# Patient Record
Sex: Male | Born: 1950 | Race: White | Hispanic: No | Marital: Married | Smoking: Former smoker
Health system: Southern US, Community
[De-identification: ages and names within clinical notes are randomized; demographics above are authoritative.]

## PROBLEM LIST (undated history)

## (undated) DIAGNOSIS — I209 Angina pectoris, unspecified: Secondary | ICD-10-CM

## (undated) DIAGNOSIS — D649 Anemia, unspecified: Secondary | ICD-10-CM

## (undated) DIAGNOSIS — K603 Anal fistula, unspecified: Secondary | ICD-10-CM

## (undated) DIAGNOSIS — E785 Hyperlipidemia, unspecified: Secondary | ICD-10-CM

## (undated) DIAGNOSIS — I4510 Unspecified right bundle-branch block: Secondary | ICD-10-CM

## (undated) DIAGNOSIS — E119 Type 2 diabetes mellitus without complications: Secondary | ICD-10-CM

## (undated) DIAGNOSIS — K317 Polyp of stomach and duodenum: Secondary | ICD-10-CM

## (undated) DIAGNOSIS — N3281 Overactive bladder: Secondary | ICD-10-CM

## (undated) DIAGNOSIS — M1990 Unspecified osteoarthritis, unspecified site: Secondary | ICD-10-CM

## (undated) DIAGNOSIS — K611 Rectal abscess: Secondary | ICD-10-CM

## (undated) DIAGNOSIS — M5030 Other cervical disc degeneration, unspecified cervical region: Secondary | ICD-10-CM

## (undated) DIAGNOSIS — K31819 Angiodysplasia of stomach and duodenum without bleeding: Secondary | ICD-10-CM

## (undated) DIAGNOSIS — N529 Male erectile dysfunction, unspecified: Secondary | ICD-10-CM

## (undated) DIAGNOSIS — K219 Gastro-esophageal reflux disease without esophagitis: Secondary | ICD-10-CM

## (undated) DIAGNOSIS — M65311 Trigger thumb, right thumb: Secondary | ICD-10-CM

## (undated) DIAGNOSIS — M109 Gout, unspecified: Secondary | ICD-10-CM

## (undated) DIAGNOSIS — N281 Cyst of kidney, acquired: Secondary | ICD-10-CM

## (undated) DIAGNOSIS — K2980 Duodenitis without bleeding: Secondary | ICD-10-CM

## (undated) DIAGNOSIS — M65321 Trigger finger, right index finger: Secondary | ICD-10-CM

## (undated) DIAGNOSIS — I219 Acute myocardial infarction, unspecified: Secondary | ICD-10-CM

## (undated) DIAGNOSIS — K209 Esophagitis, unspecified without bleeding: Secondary | ICD-10-CM

## (undated) DIAGNOSIS — I498 Other specified cardiac arrhythmias: Secondary | ICD-10-CM

## (undated) DIAGNOSIS — I2510 Atherosclerotic heart disease of native coronary artery without angina pectoris: Secondary | ICD-10-CM

## (undated) DIAGNOSIS — M5135 Other intervertebral disc degeneration, thoracolumbar region: Secondary | ICD-10-CM

## (undated) DIAGNOSIS — I10 Essential (primary) hypertension: Secondary | ICD-10-CM

## (undated) DIAGNOSIS — M65341 Trigger finger, right ring finger: Secondary | ICD-10-CM

## (undated) DIAGNOSIS — I700 Atherosclerosis of aorta: Secondary | ICD-10-CM

## (undated) HISTORY — DX: Hyperlipidemia, unspecified: E78.5

## (undated) HISTORY — PX: CARDIAC CATHETERIZATION: SHX172

## (undated) HISTORY — PX: TONSILLECTOMY: SUR1361

## (undated) HISTORY — PX: ANAL FISTULOTOMY: SHX6423

## (undated) HISTORY — DX: Overactive bladder: N32.81

## (undated) HISTORY — DX: Gout, unspecified: M10.9

## (undated) HISTORY — PX: APPENDECTOMY: SHX54

---

## 2012-02-05 DIAGNOSIS — K611 Rectal abscess: Secondary | ICD-10-CM | POA: Insufficient documentation

## 2012-09-08 DIAGNOSIS — K603 Anal fistula: Secondary | ICD-10-CM | POA: Insufficient documentation

## 2017-02-21 ENCOUNTER — Inpatient Hospital Stay
Admission: EM | Admit: 2017-02-21 | Discharge: 2017-02-25 | DRG: 247 | Disposition: A | Discharge status: Home / Self Care | Payer: Medicare Other | Attending: Internal Medicine | Admitting: Internal Medicine

## 2017-02-21 ENCOUNTER — Encounter: Payer: Self-pay | Admitting: Emergency Medicine

## 2017-02-21 ENCOUNTER — Emergency Department: Payer: Medicare Other

## 2017-02-21 DIAGNOSIS — R739 Hyperglycemia, unspecified: Secondary | ICD-10-CM | POA: Diagnosis present

## 2017-02-21 DIAGNOSIS — Z880 Allergy status to penicillin: Secondary | ICD-10-CM | POA: Diagnosis not present

## 2017-02-21 DIAGNOSIS — Z87891 Personal history of nicotine dependence: Secondary | ICD-10-CM | POA: Diagnosis not present

## 2017-02-21 DIAGNOSIS — I10 Essential (primary) hypertension: Secondary | ICD-10-CM | POA: Diagnosis present

## 2017-02-21 DIAGNOSIS — I161 Hypertensive emergency: Secondary | ICD-10-CM | POA: Diagnosis present

## 2017-02-21 DIAGNOSIS — I2510 Atherosclerotic heart disease of native coronary artery without angina pectoris: Secondary | ICD-10-CM | POA: Diagnosis present

## 2017-02-21 DIAGNOSIS — E785 Hyperlipidemia, unspecified: Secondary | ICD-10-CM | POA: Diagnosis present

## 2017-02-21 DIAGNOSIS — R0789 Other chest pain: Secondary | ICD-10-CM | POA: Diagnosis present

## 2017-02-21 DIAGNOSIS — Z886 Allergy status to analgesic agent status: Secondary | ICD-10-CM | POA: Diagnosis not present

## 2017-02-21 DIAGNOSIS — I2584 Coronary atherosclerosis due to calcified coronary lesion: Secondary | ICD-10-CM

## 2017-02-21 DIAGNOSIS — E876 Hypokalemia: Secondary | ICD-10-CM | POA: Diagnosis present

## 2017-02-21 DIAGNOSIS — I25119 Atherosclerotic heart disease of native coronary artery with unspecified angina pectoris: Secondary | ICD-10-CM | POA: Diagnosis present

## 2017-02-21 DIAGNOSIS — I214 Non-ST elevation (NSTEMI) myocardial infarction: Secondary | ICD-10-CM | POA: Diagnosis present

## 2017-02-21 DIAGNOSIS — R079 Chest pain, unspecified: Secondary | ICD-10-CM

## 2017-02-21 DIAGNOSIS — Z136 Encounter for screening for cardiovascular disorders: Secondary | ICD-10-CM | POA: Diagnosis not present

## 2017-02-21 HISTORY — DX: Non-ST elevation (NSTEMI) myocardial infarction: I21.4

## 2017-02-21 LAB — CBC
HCT: 47 % (ref 40.0–52.0)
Hemoglobin: 16 g/dL (ref 13.0–18.0)
MCH: 28.9 pg (ref 26.0–34.0)
MCHC: 33.9 g/dL (ref 32.0–36.0)
MCV: 85.3 fL (ref 80.0–100.0)
PLATELETS: 226 10*3/uL (ref 150–440)
RBC: 5.51 MIL/uL (ref 4.40–5.90)
RDW: 13.7 % (ref 11.5–14.5)
WBC: 6 10*3/uL (ref 3.8–10.6)

## 2017-02-21 LAB — TROPONIN I
Troponin I: <0.03 ng/mL (ref <0.03)
Troponin I: 2.98 ng/mL (ref <0.03)

## 2017-02-21 LAB — BASIC METABOLIC PANEL
ANION GAP: 9 (ref 5–15)
BUN: 18 mg/dL (ref 6–20)
CALCIUM: 9.1 mg/dL (ref 8.9–10.3)
CO2: 24 mmol/L (ref 22–32)
CREATININE: 0.88 mg/dL (ref 0.61–1.24)
Chloride: 104 mmol/L (ref 101–111)
GFR calc Af Amer: >60 mL/min (ref >60)
GLUCOSE: 111 mg/dL — AB (ref 65–99)
Potassium: 3.4 mmol/L — ABNORMAL LOW (ref 3.5–5.1)
Sodium: 137 mmol/L (ref 135–145)

## 2017-02-21 LAB — PROTIME-INR
INR: 1.03
Prothrombin Time: 13.5 seconds (ref 11.4–15.2)

## 2017-02-21 LAB — APTT: aPTT: 29 seconds (ref 24–36)

## 2017-02-21 LAB — MAGNESIUM: MAGNESIUM: 2 mg/dL (ref 1.7–2.4)

## 2017-02-21 MED ORDER — HEPARIN BOLUS VIA INFUSION
4000.0000 [IU] | Freq: Once | INTRAVENOUS | Status: AC
  Administered 2017-02-21: 4000 [IU] via INTRAVENOUS
  Filled 2017-02-21: qty 4000

## 2017-02-21 MED ORDER — ONDANSETRON HCL 4 MG/2ML IJ SOLN: 4.0000 mg | Freq: Four times a day (QID) | INTRAMUSCULAR | Status: DC | PRN

## 2017-02-21 MED ORDER — IOPAMIDOL (ISOVUE-370) INJECTION 76%
125.0000 mL | Freq: Once | INTRAVENOUS | Status: AC | PRN
  Administered 2017-02-21: 125 mL via INTRAVENOUS

## 2017-02-21 MED ORDER — POTASSIUM CHLORIDE CRYS ER 20 MEQ PO TBCR
40.0000 meq | EXTENDED_RELEASE_TABLET | Freq: Once | ORAL | Status: AC
  Administered 2017-02-21: 40 meq via ORAL
  Filled 2017-02-21: qty 2

## 2017-02-21 MED ORDER — HYDRALAZINE HCL 20 MG/ML IJ SOLN
10.0000 mg | INTRAMUSCULAR | Status: DC | PRN
  Administered 2017-02-21: 10 mg via INTRAVENOUS
  Filled 2017-02-21: qty 1

## 2017-02-21 MED ORDER — ASPIRIN EC 81 MG PO TBEC
81.0000 mg | DELAYED_RELEASE_TABLET | Freq: Every day | ORAL | Status: DC
  Administered 2017-02-22 – 2017-02-23 (×2): 81 mg via ORAL
  Filled 2017-02-21 (×2): qty 1

## 2017-02-21 MED ORDER — METOPROLOL TARTRATE 25 MG PO TABS
25.0000 mg | ORAL_TABLET | Freq: Two times a day (BID) | ORAL | Status: DC
  Administered 2017-02-22 – 2017-02-25 (×5): 25 mg via ORAL
  Filled 2017-02-21 (×7): qty 1

## 2017-02-21 MED ORDER — HEPARIN (PORCINE) IN NACL 100-0.45 UNIT/ML-% IJ SOLN
1400.0000 [IU]/h | INTRAMUSCULAR | Status: DC
  Administered 2017-02-21: 1200 [IU]/h via INTRAVENOUS
  Administered 2017-02-23 – 2017-02-24 (×2): 1400 [IU]/h via INTRAVENOUS
  Filled 2017-02-21 (×4): qty 250

## 2017-02-21 MED ORDER — ACETAMINOPHEN 325 MG PO TABS
650.0000 mg | ORAL_TABLET | ORAL | Status: DC | PRN
Start: 2017-02-21 — End: 2017-02-25

## 2017-02-21 MED ORDER — ACETAMINOPHEN 325 MG PO TABS
650.0000 mg | ORAL_TABLET | Freq: Once | ORAL | Status: AC
  Administered 2017-02-21: 650 mg via ORAL
  Filled 2017-02-21: qty 2

## 2017-02-21 MED ORDER — NITROGLYCERIN 2 % TD OINT
1.0000 [in_us] | TOPICAL_OINTMENT | Freq: Once | TRANSDERMAL | Status: AC
  Administered 2017-02-21: 1 [in_us] via TOPICAL
  Filled 2017-02-21: qty 1

## 2017-02-21 MED ORDER — ASPIRIN 81 MG PO CHEW
324.0000 mg | CHEWABLE_TABLET | Freq: Once | ORAL | Status: AC
  Administered 2017-02-21: 324 mg via ORAL
  Filled 2017-02-21: qty 4

## 2017-02-21 MED ORDER — ATORVASTATIN CALCIUM 20 MG PO TABS: 40.0000 mg | ORAL_TABLET | Freq: Every day | ORAL | Status: DC

## 2017-02-21 MED ORDER — NITROGLYCERIN 0.4 MG SL SUBL: 0.4000 mg | SUBLINGUAL_TABLET | SUBLINGUAL | Status: DC | PRN

## 2017-02-21 NOTE — Progress Notes (Signed)
Patient admitted to room 238 with the diagnosis of chest pain due to CAD. Alert and oriented x 4. Denied any acute chest pain. Tele box reconciled to CCMD with Janett Billow NT as a second verifier. Skin assessment done with [place], noted abrasion on left lower extremities. Password was set. Patient has not voiced any complaint about his care. Will continue to monitor.

## 2017-02-21 NOTE — Progress Notes (Signed)
Patient's HR fluctuating between 56-63 and he has Metoprolol 25 mg to be administered at this time. Dr. Jannifer Franklin notified and received new order to hold the metoprolol and new order for PRN Hydralazine . Order will be implemented as received and continue to monitor.

## 2017-02-21 NOTE — ED Triage Notes (Signed)
Pt to ed with acute onset of chest pain that started 30 min pta.  Pt states left arm and left shoulder pain and pressure and pain in mid thoracic area in his back .

## 2017-02-21 NOTE — ED Notes (Signed)
Patient ambulatory to restroom without assistance. 

## 2017-02-21 NOTE — ED Notes (Signed)
Pt used restroom prior to transport to 2A. Floor was called prior to arrival

## 2017-02-21 NOTE — H&P (Signed)
[name] [facility] [place] NAME: Mark Wyatt    MR#:  [id number]  DATE OF BIRTH:  1951/07/14  DATE OF ADMISSION:  02/21/2017  PRIMARY CARE PHYSICIAN: Patient, No Pcp Per   REQUESTING/REFERRING PHYSICIAN: Schuyler Amor, MD  CHIEF COMPLAINT:   Chief Complaint  Patient presents with  . Chest Pain   Chest pain yesterday and today. HISTORY OF PRESENT ILLNESS:  Mark Wyatt  is a 66 y.o. male with a known history of Hypertension. The patient to present to the ED with chest pain today in substernal area, sharp, 10 out of 10, radiation to her left shoulder, arm and back, lasted about 45 minutes. He had a similar chest pain yesterday, which lasted about 15 minutes. He denies any palpitation, nausea or diaphoresis. No headache or dizziness. He has a history of hypertension but not on any hypertension medication. His blood pressure is elevated at 205/95. He denies any anxiety or stress. He's not on any medication. The first troponin level is normal. CT chest with contrast didn't show any aortic dissection but showed aortic atherosclerosis, in addition to left main and 3 vessel coronary artery disease. He was treated with aspirin in the ED.  PAST MEDICAL HISTORY:  History reviewed. No pertinent past medical history.  PAST SURGICAL HISTORY:   Past Surgical History:  Procedure Laterality Date  . ANAL FISTULOTOMY      SOCIAL HISTORY:   Social History  Substance Use Topics  . Smoking status: Never Smoker  . Smokeless tobacco: Never Used  . Alcohol use Yes    FAMILY HISTORY:   Family History  Problem Relation Age of Onset  . Diabetes Mother     DRUG ALLERGIES:   Allergies  Allergen Reactions  . Asa [Aspirin]   . Penicillins Other (See Comments)    Headache     REVIEW OF SYSTEMS:   Review of Systems  Constitutional: Negative for chills, diaphoresis, fever and malaise/fatigue.  HENT: Negative for congestion and sore throat.   Eyes:  Negative for blurred vision and double vision.  Respiratory: Negative for cough, shortness of breath and stridor.   Cardiovascular: Positive for chest pain. Negative for palpitations and leg swelling.  Gastrointestinal: Negative for abdominal pain, blood in stool, constipation, diarrhea, melena, nausea and vomiting.  Genitourinary: Negative for dysuria and hematuria.  Musculoskeletal: Positive for back pain.  Neurological: Negative for dizziness, focal weakness, loss of consciousness, weakness and headaches.  Psychiatric/Behavioral: Negative for depression. The patient is not nervous/anxious.     MEDICATIONS AT HOME:   Prior to Admission medications   Not on File      VITAL SIGNS:  Blood pressure (!) 169/95, pulse 61, temperature 97 F (36.1 C), temperature source Oral, resp. rate 16, height 5\' 11"  (1.803 m), weight 205 lb (93 kg), SpO2 99 %.  PHYSICAL EXAMINATION:  Physical Exam  GENERAL:  66 y.o.-year-old patient lying in the bed with no acute distress.  EYES: Pupils equal, round, reactive to light and accommodation. No scleral icterus. Extraocular muscles intact.  HEENT: Head atraumatic, normocephalic. Oropharynx and nasopharynx clear.  NECK:  Supple, no jugular venous distention. No thyroid enlargement, no tenderness.  LUNGS: Normal breath sounds bilaterally, no wheezing, rales,rhonchi or crepitation. No use of accessory muscles of respiration.  CARDIOVASCULAR: S1, S2 normal. No murmurs, rubs, or gallops.  ABDOMEN: Soft, nontender, nondistended. Bowel sounds present. No organomegaly or mass.  EXTREMITIES: No pedal edema, cyanosis, or clubbing.  NEUROLOGIC: Cranial nerves II  through XII are intact. Muscle strength 5/5 in all extremities. Sensation intact. Gait not checked.  PSYCHIATRIC: The patient is alert and oriented x 3.  SKIN: No obvious rash, lesion, or ulcer.   LABORATORY PANEL:   CBC  Recent Labs Lab 02/21/17 1426  WBC 6.0  HGB 16.0  HCT 47.0  PLT 226    ------------------------------------------------------------------------------------------------------------------  Chemistries   Recent Labs Lab 02/21/17 1426  NA 137  K 3.4*  CL 104  CO2 24  GLUCOSE 111*  BUN 18  CREATININE 0.88  CALCIUM 9.1   ------------------------------------------------------------------------------------------------------------------  Cardiac Enzymes  Recent Labs Lab 02/21/17 1426  TROPONINI <0.03   ------------------------------------------------------------------------------------------------------------------  RADIOLOGY:  Dg Chest 2 View  Result Date: 02/21/2017 CLINICAL DATA:  Chest pain EXAM: CHEST  2 VIEW COMPARISON:  None. FINDINGS: Normal heart size and mediastinal contours. No acute infiltrate or edema. No effusion or pneumothorax. No acute osseous findings. IMPRESSION: Negative chest. Electronically Signed   By: Monte Fantasia M.D.   On: 02/21/2017 14:27   Ct Angio Chest/abd/pel For Dissection W And/or Wo Contrast  Result Date: 02/21/2017 CLINICAL DATA:  66 year old male presenting to the emergency department with acute onset of chest pain 30 minutes prior to admission, with some associated left arm and left shoulder pain with pressure sensation in the mid thoracic region. EXAM: CT ANGIOGRAPHY CHEST, ABDOMEN AND PELVIS TECHNIQUE: Multidetector CT imaging through the chest, abdomen and pelvis was performed using the standard protocol during bolus administration of intravenous contrast. Multiplanar reconstructed images and MIPs were obtained and reviewed to evaluate the vascular anatomy. CONTRAST:  125 mL of Isovue 370. COMPARISON:  No priors. FINDINGS: CTA CHEST FINDINGS Cardiovascular: Heart size is normal. There is no significant pericardial fluid, thickening or pericardial calcification. There is aortic atherosclerosis, as well as atherosclerosis of the great vessels of the mediastinum and the coronary arteries, including calcified  atherosclerotic plaque in the left main, left anterior descending, left circumflex and right coronary arteries. Calcifications of the aortic valve. No acute abnormality of the thoracic aorta; specifically, no aneurysm or dissection. Additionally, on the precontrast images there is no crescentic high attenuation associated with the wall of the thoracic aorta to suggest acute intramural hemorrhage. Mediastinum/Nodes: No pathologically enlarged mediastinal or hilar lymph nodes. Esophagus is unremarkable in appearance. No axillary lymphadenopathy. Lungs/Pleura: No acute consolidative airspace disease. No pleural effusions. No suspicious appearing pulmonary nodules or masses. Musculoskeletal: There are no aggressive appearing lytic or blastic lesions noted in the visualized portions of the skeleton. Review of the MIP images confirms the above findings. CTA ABDOMEN AND PELVIS FINDINGS VASCULAR Aorta: Normal caliber aorta without aneurysm, dissection, vasculitis or significant stenosis. Celiac: Patent without evidence of aneurysm, dissection, vasculitis or significant stenosis. SMA: Patent without evidence of aneurysm, dissection, vasculitis or significant stenosis. Renals: All renal arteries (2 right-sided and 3 left-sided) are patent without evidence of aneurysm, dissection, vasculitis, fibromuscular dysplasia or significant stenosis. IMA: Patent without evidence of aneurysm, dissection, vasculitis or significant stenosis. Inflow: Patent without evidence of aneurysm, dissection, vasculitis or significant stenosis. Veins: No obvious venous abnormality within the limitations of this arterial phase study. Review of the MIP images confirms the above findings. NON-VASCULAR Hepatobiliary: Diffuse low attenuation throughout the hepatic parenchyma, compatible with hepatic steatosis. No suspicious cystic or solid hepatic lesions. No intra or extrahepatic biliary ductal dilatation. Gallbladder is normal in appearance. Pancreas: No  pancreatic mass. No pancreatic ductal dilatation. No pancreatic or peripancreatic fluid or inflammatory changes. Spleen: Unremarkable. Adrenals/Urinary Tract: Exophytic 3 cm low-attenuation  nonenhancing lesion in the upper pole of the right kidney, compatible with a simple cyst. Left kidney and bilateral adrenal glands are normal in appearance. No hydroureteronephrosis. Urinary bladder is normal in appearance. Stomach/Bowel: The appearance of the stomach is normal. There is no pathologic dilatation of small bowel or colon. The appendix is not confidently identified and may be surgically absent. Regardless, there are no inflammatory changes noted adjacent to the cecum to suggest the presence of an acute appendicitis at this time. Lymphatic: No lymphadenopathy noted in the abdomen or pelvis. Reproductive: Prostate gland and seminal vesicles are unremarkable in appearance. Other: No significant volume of ascites.  No pneumoperitoneum. Musculoskeletal: There are no aggressive appearing lytic or blastic lesions noted in the visualized portions of the skeleton. Review of the MIP images confirms the above findings. IMPRESSION: 1. No acute abnormality in the chest, abdomen or pelvis to account for the patient's symptoms. Specifically, no evidence of acute aortic syndrome. 2. Aortic atherosclerosis, in addition to left main and 3 vessel coronary artery disease. Please note that although the presence of coronary artery calcium documents the presence of coronary artery disease, the severity of this disease and any potential stenosis cannot be assessed on this non-gated CT examination. Assessment for potential risk factor modification, dietary therapy or pharmacologic therapy may be warranted, if clinically indicated. 3. Hepatic steatosis. 4. Additional incidental findings, as above. Electronically Signed   By: Vinnie Langton M.D.   On: 02/21/2017 16:51      IMPRESSION AND PLAN:   Chest pain with 3 vessel CAD. The  patient will be admitted to medical floor. He was given aspirin in the ED. I will start heparin drip due to concern about 3 vessel disease with typical heart attack symptoms. Follow-up troponin level and lipid panel add Lipitor and Lopressor, follow-up cardiology consult. Nothing by mouth after midnight in case cardiology will do cardiac catheter in a.m.  Hypertension emergency.  The patient's blood pressure was 202/95. I start Lopressor and hydralazine IV when necessary.  Hypokalemia. Give potassium supplement, follow-up potassium level and magnesium level.  All the records are reviewed and case discussed with ED provider. Management plans discussed with the patient, his wife and they are in agreement.  CODE STATUS: Full code  TOTAL TIME TAKING CARE OF THIS PATIENT: 58 minutes.    Demetrios Loll M.D on 02/21/2017 at 6:44 PM  Between 7am to 6pm - Pager - [phone]  After 6pm go to [email or web address] - Proofreader  [facility]  Office  [phone]  CC: Primary care physician; Patient, No Pcp Per   Note: This dictation was prepared with Dragon dictation along with smaller phrase technology. Any transcriptional errors that result from this process are unintentional.

## 2017-02-21 NOTE — ED Notes (Signed)
Patient transported patient to 2A. This RN unavailable d/t EMS arrival.

## 2017-02-21 NOTE — ED Provider Notes (Addendum)
[facility] Emergency Department Provider Note  ____________________________________________   I have reviewed the triage vital signs and the nursing notes.   HISTORY  Chief Complaint Chest Pain    HPI Mark Wyatt is a 66 y.o. male who presents today complaining of chest pain that radiated to his left arm and to his back. Started about 30 minute prior to arrival. Pain was essentially gone upon arrival. He states that he had a little chest pain on exertion yesterday as well. Denies fever or chills. Was short of breath with this. States it was a significant discomfort. Did begin somewhat rapidly. He was working in the yard. He states that he has had no cough or leg swelling. No recent illness. Denies any nausea or vomiting. Pain was a pressure-like and also sharp discomfort he describes as. Wasn't tearing. No alleviating or aggravating factors that he can think of. States that normally his blood pressure is very well controlled but he feels anxious at this time. He does not take any antihypertensive medications. He states his aspirin allergy is "he sometimes feels swimmy headed" he has not actually had any anaphylactic reaction or throat or tongue swelling or hives or anything else with aspirin.     History reviewed. No pertinent past medical history.  There are no active problems to display for this patient.   History reviewed. No pertinent surgical history.  Prior to Admission medications   Not on File    Allergies Asa [aspirin] and Penicillins  No family history on file.  Social History Social History  Substance Use Topics  . Smoking status: Never Smoker  . Smokeless tobacco: Never Used  . Alcohol use Yes    Review of Systems Constitutional: No fever/chills Eyes: No visual changes. ENT: No sore throat. No stiff neck no neck pain Cardiovascular: Positive chest pain. Respiratory: Positive shortness of breath. Gastrointestinal:   no vomiting.   No diarrhea.  No constipation. Genitourinary: Negative for dysuria. Musculoskeletal: Negative lower extremity swelling Skin: Negative for rash. Neurological: Negative for severe headaches, focal weakness or numbness. 10-point ROS otherwise negative.  ____________________________________________   PHYSICAL EXAM:  VITAL SIGNS: ED Triage Vitals  Enc Vitals Group     BP 02/21/17 1411 (!) 202/95     Pulse Rate 02/21/17 1411 61     Resp 02/21/17 1411 20     Temp 02/21/17 1411 97 F (36.1 C)     Temp Source 02/21/17 1411 Oral     SpO2 02/21/17 1411 98 %     Weight 02/21/17 1412 205 lb (93 kg)     Height 02/21/17 1412 5\' 11"  (1.803 m)     Head Circumference --      Peak Flow --      Pain Score 02/21/17 1407 8     Pain Loc --      Pain Edu? --      Excl. in [place]? --     Constitutional: Alert and oriented. Well appearing and in no acute distress. Anxious Eyes: Conjunctivae are normal. PERRL. EOMI. Head: Atraumatic. Nose: No congestion/rhinnorhea. Mouth/Throat: Mucous membranes are moist.  Oropharynx non-erythematous. Neck: No stridor.   Nontender with no meningismus Cardiovascular: Normal rate, regular rhythm. Grossly normal heart sounds.  Good peripheral circulation. Respiratory: Normal respiratory effort.  No retractions. Lungs CTAB. Abdominal: Soft and nontender. No distention. No guarding no rebound Back:  There is no focal tenderness or step off.  there is no midline tenderness there are no lesions noted. there is  no CVA tenderness Musculoskeletal: No lower extremity tenderness, no upper extremity tenderness. No joint effusions, no DVT signs strong distal pulses no edema Neurologic:  Normal speech and language. No gross focal neurologic deficits are appreciated.  Skin:  Skin is warm, dry and intact. No rash noted. Psychiatric: Mood and affect are anxious. Speech and behavior are normal.  ____________________________________________   LABS (all labs ordered are listed, but  only abnormal results are displayed)  Labs Reviewed  BASIC METABOLIC PANEL - Abnormal; Notable for the following:       Result Value   Potassium 3.4 (*)    Glucose, Bld 111 (*)    All other components within normal limits  CBC  TROPONIN I   ____________________________________________  EKG  I personally interpreted any EKGs ordered by me or triage Sinus rhythm at 65 bpm, no acute ST elevation, normal axis, PVC noted. Nonspecific ST changes. ____________________________________________  [id number]  I reviewed any imaging ordered by me or triage that were performed during my shift and, if possible, patient and/or family made aware of any abnormal findings. ____________________________________________   PROCEDURES  Procedure(s) performed: None  Procedures  Critical Care performed: None  ____________________________________________   INITIAL IMPRESSION / ASSESSMENT AND PLAN / ED COURSE  Pertinent labs & imaging results that were available during my care of the patient were reviewed by me and considered in my medical decision making (see chart for details).  Administration with a very concerning story of significant chest pain which radiated to his arm and back. Given his elevated blood pressure I did do a CT dissection which is negative for dissection but does show atherosclerosis of multiple vessels. Patient will require admission. I talked about the risks benefits and alternatives of aspirin given his history of aspirin intolerance and he would prefer to have it I think that is the right choice. He has no history of anaphylaxis with aspirin. Patient is pain-free at this time. His blood pressures coming down as he relaxes. He has gone down 25 points systolic since his arrival here. We'll put nitroglycerin on his chest wall. He does not know that he had a history of hypertension but he may well have had. We will continue to manage him medically here he is chest pain-free at this  time, he will need admission for the hospital.  ----------------------------------------- 5:28 PM on 02/21/2017 -----------------------------------------  Pressure down nearly 40 points from its height, we are still watching it, no chest pain. Admitted to the hospitalist.    ____________________________________________   FINAL CLINICAL IMPRESSION(S) / ED DIAGNOSES  Final diagnoses:  None      This chart was dictated using voice recognition software.  Despite best efforts to proofread,  errors can occur which can change meaning.      Schuyler Amor, MD 02/21/17 [place], MD 02/21/17 [phone]

## 2017-02-21 NOTE — Progress Notes (Signed)
ANTICOAGULATION CONSULT NOTE - Initial Consult  Pharmacy Consult for heparin bolus and drip Indication: chest pain/ACS  Allergies  Allergen Reactions  . Asa [Aspirin]   . Penicillins Other (See Comments)    Headache     Patient Measurements: Height: 5\' 11"  (180.3 cm) Weight: 205 lb (93 kg) IBW/kg (Calculated) : 75.3 Heparin Dosing Weight: 93 kg  Vital Signs: Temp: 97.5 F (36.4 C) (05/04 2007) Temp Source: Oral (05/04 2007) BP: 174/80 (05/04 2007) Pulse Rate: 54 (05/04 2007)  Labs:  Recent Labs  02/21/17 1426  HGB 16.0  HCT 47.0  PLT 226  CREATININE 0.88  TROPONINI <0.03    Estimated Creatinine Clearance: 96.2 mL/min (by C-G formula based on SCr of 0.88 mg/dL).   Medical History: History reviewed. No pertinent past medical history.  Medications:  Scheduled:  . [START ON 02/22/2017] aspirin EC  81 mg Oral Daily  . [START ON 02/22/2017] atorvastatin  40 mg Oral q1800  . heparin  4,000 Units Intravenous Once  . metoprolol tartrate  25 mg Oral BID  . potassium chloride  40 mEq Oral Once   Infusions:  . heparin      Assessment: Pharmacy has been consulted to dose heparin bolus an drip in this 57 yoM with ACS. Per med rec, patient does not take any medication prior to admission. Baseline labs have been ordered. Plan for cardiac cath in am.  Goal of Therapy:  Heparin level 0.3-0.7 units/ml Monitor platelets by anticoagulation protocol: Yes   Plan:  Give 4000 units bolus x 1 Start heparin infusion at 1200 units/hr Check anti-Xa level in 6 hours and daily while on heparin Continue to monitor H&H and platelets  Darrow Bussing, PharmD Pharmacy Resident 02/21/2017 8:11 PM

## 2017-02-22 ENCOUNTER — Inpatient Hospital Stay
Admit: 2017-02-22 | Discharge: 2017-02-22 | Disposition: A | Discharge status: Home / Self Care | Payer: Medicare Other | Attending: Cardiology | Admitting: Cardiology

## 2017-02-22 DIAGNOSIS — I214 Non-ST elevation (NSTEMI) myocardial infarction: Secondary | ICD-10-CM

## 2017-02-22 DIAGNOSIS — I252 Old myocardial infarction: Secondary | ICD-10-CM | POA: Insufficient documentation

## 2017-02-22 DIAGNOSIS — I25119 Atherosclerotic heart disease of native coronary artery with unspecified angina pectoris: Secondary | ICD-10-CM

## 2017-02-22 LAB — ECHOCARDIOGRAM COMPLETE
Height: 71 in
Weight: 3304 [oz_av]

## 2017-02-22 LAB — CBC
HCT: 44.9 % (ref 40.0–52.0)
HEMOGLOBIN: 15 g/dL (ref 13.0–18.0)
MCH: 28.2 pg (ref 26.0–34.0)
MCHC: 33.3 g/dL (ref 32.0–36.0)
MCV: 84.8 fL (ref 80.0–100.0)
PLATELETS: 209 10*3/uL (ref 150–440)
RBC: 5.29 MIL/uL (ref 4.40–5.90)
RDW: 13.6 % (ref 11.5–14.5)
WBC: 6.9 10*3/uL (ref 3.8–10.6)

## 2017-02-22 LAB — HEPARIN LEVEL (UNFRACTIONATED)
HEPARIN UNFRACTIONATED: 0.26 [IU]/mL — AB (ref 0.30–0.70)
Heparin Unfractionated: 0.38 IU/mL (ref 0.30–0.70)
Heparin Unfractionated: 0.5 [IU]/mL (ref 0.30–0.70)

## 2017-02-22 LAB — LIPID PANEL
Cholesterol: 174 mg/dL (ref 0–200)
HDL: 34 mg/dL — ABNORMAL LOW (ref >40)
LDL CALC: 111 mg/dL — AB (ref 0–99)
TRIGLYCERIDES: 143 mg/dL (ref <150)
Total CHOL/HDL Ratio: 5.1 RATIO
VLDL: 29 mg/dL (ref 0–40)

## 2017-02-22 LAB — TSH: TSH: 4.7 u[IU]/mL — AB (ref 0.350–4.500)

## 2017-02-22 LAB — TROPONIN I: Troponin I: 4.93 ng/mL (ref <0.03)

## 2017-02-22 MED ORDER — ATORVASTATIN CALCIUM 20 MG PO TABS
40.0000 mg | ORAL_TABLET | Freq: Every day | ORAL | Status: DC
  Administered 2017-02-22 – 2017-02-24 (×3): 40 mg via ORAL
  Filled 2017-02-22 (×3): qty 2

## 2017-02-22 MED ORDER — HEPARIN BOLUS VIA INFUSION
1400.0000 [IU] | Freq: Once | INTRAVENOUS | Status: AC
  Administered 2017-02-22: 1400 [IU] via INTRAVENOUS
  Filled 2017-02-22: qty 1400

## 2017-02-22 MED ORDER — PERFLUTREN LIPID MICROSPHERE
1.0000 mL | INTRAVENOUS | Status: AC | PRN
  Administered 2017-02-22: 8 mL via INTRAVENOUS
  Filled 2017-02-22: qty 10

## 2017-02-22 MED ORDER — NITROGLYCERIN 2 % TD OINT
1.0000 [in_us] | TOPICAL_OINTMENT | Freq: Four times a day (QID) | TRANSDERMAL | Status: DC
  Administered 2017-02-22 – 2017-02-25 (×11): 1 [in_us] via TOPICAL
  Filled 2017-02-22 (×11): qty 1

## 2017-02-22 NOTE — Progress Notes (Signed)
*  PRELIMINARY RESULTS* Echocardiogram 2D Echocardiogram has been performed. Definity Contrast requested & used on this study.  Mark Wyatt Mark Wyatt 02/22/2017, 12:27 PM

## 2017-02-22 NOTE — Progress Notes (Signed)
2nd Troponin 2.98, Patient denies any chest pain. Dr. Jannifer Franklin notified. No new order received but to follow the trend and monitor.

## 2017-02-22 NOTE — Progress Notes (Signed)
ANTICOAGULATION CONSULT NOTE - FOLLOW UP   Pharmacy Consult for heparin bolus and drip Indication: chest pain/ACS  Allergies  Allergen Reactions  . Asa [Aspirin]   . Penicillins Other (See Comments)    Headache     Patient Measurements: Height: 5\' 11"  (180.3 cm) Weight: 206 lb 8 oz (93.7 kg) IBW/kg (Calculated) : 75.3 Heparin Dosing Weight: 93 kg  Vital Signs: Temp: 97.6 F (36.4 C) (05/05 0724) Temp Source: Oral (05/05 0724) BP: 156/79 (05/05 0724) Pulse Rate: 58 (05/05 0724)  Labs:  Recent Labs  02/21/17 1426 02/21/17 2012 02/22/17 0315  HGB 16.0  --  15.0  HCT 47.0  --  44.9  PLT 226  --  209  APTT  --  29  --   LABPROT  --  13.5  --   INR  --  1.03  --   HEPARINUNFRC  --   --  0.26*  CREATININE 0.88  --   --   TROPONINI <0.03 2.98* 4.93*    Estimated Creatinine Clearance: 96.6 mL/min (by C-G formula based on SCr of 0.88 mg/dL).   Medical History: History reviewed. No pertinent past medical history.  Medications:  Scheduled:  . aspirin EC  81 mg Oral Daily  . atorvastatin  40 mg Oral q1800  . metoprolol tartrate  25 mg Oral BID  . nitroGLYCERIN  1 inch Topical Q6H   Infusions:  . heparin 1,200 Units/hr (02/21/17 2107)    Assessment: Pharmacy has been consulted to dose heparin bolus an drip in this 27 yoM with ACS. Per med rec, patient does not take any medication prior to admission. Baseline labs have been ordered. Plan for cardiac cath in am.  Goal of Therapy:  Heparin level 0.3-0.7 units/ml Monitor platelets by anticoagulation protocol: Yes   Plan:  Will give 1400 units x 1 bolus. Will increase heparin gtt rate to 1400 units/hr. Will recheck heparin level @ 17:00.   Larene Beach, PharmD

## 2017-02-22 NOTE — Progress Notes (Addendum)
ANTICOAGULATION CONSULT NOTE - FOLLOW UP   Pharmacy Consult for heparin bolus and drip Indication: chest pain/ACS  Allergies  Allergen Reactions  . Asa [Aspirin]   . Penicillins Other (See Comments)    Headache     Patient Measurements: Height: 5\' 11"  (180.3 cm) Weight: 206 lb 8 oz (93.7 kg) IBW/kg (Calculated) : 75.3 Heparin Dosing Weight: 93 kg  Vital Signs: Temp: 97.9 F (36.6 C) (05/05 1248) Temp Source: Oral (05/05 0724) BP: 146/77 (05/05 1248) Pulse Rate: 59 (05/05 1248)  Labs:  Recent Labs  02/21/17 1426 02/21/17 2012 02/22/17 0315 02/22/17 1733  HGB 16.0  --  15.0  --   HCT 47.0  --  44.9  --   PLT 226  --  209  --   APTT  --  29  --   --   LABPROT  --  13.5  --   --   INR  --  1.03  --   --   HEPARINUNFRC  --   --  0.26* 0.50  CREATININE 0.88  --   --   --   TROPONINI <0.03 2.98* 4.93*  --     Estimated Creatinine Clearance: 96.6 mL/min (by C-G formula based on SCr of 0.88 mg/dL).   Medical History: History reviewed. No pertinent past medical history.  Medications:  Scheduled:  . aspirin EC  81 mg Oral Daily  . atorvastatin  40 mg Oral q1800  . metoprolol tartrate  25 mg Oral BID  . nitroGLYCERIN  1 inch Topical Q6H   Infusions:  . heparin 1,400 Units/hr (02/22/17 1341)    Assessment: Pharmacy has been consulted to dose heparin bolus an drip in this 66 yoM with ACS. Per med rec, patient does not take any medication prior to admission. Baseline labs have been ordered. Plan for cardiac cath in am.  Goal of Therapy:  Heparin level 0.3-0.7 units/ml Monitor platelets by anticoagulation protocol: Yes   Plan:  Will give 1400 units x 1 bolus. Will increase heparin gtt rate to 1400 units/hr. Will recheck heparin level @ 17:00.   5/5:  HL @ 17:30 = 0.50.  Will continue this pt on current rate and recheck HL in 6 hrs on 5/5 @ 23:30.   5/5 23:30 heparin level 0.38. Continue current regimen and recheck with AM labs.  5/6 AM heparin level 0.37.  Continue current regimen. Recheck CBC and heparin level with tomorrow AM labs.   Sim Boast, PharmD, BCPS  02/23/17 6:25 AM

## 2017-02-22 NOTE — Consult Note (Addendum)
[place]  CARDIOLOGY CONSULT NOTE  Patient ID: Mark Wyatt MRN: [id number] DOB/AGE: 06/20/1951 66 y.o.  Admit date: 02/21/2017 Referring Physician Dr. Benjie Karvonen Primary Physician Dr. Truman Hayward, [facility] Primary Cardiologist   Reason for Consultation nstemi  HPI: Patient is a 66 year old male with no prior cardiac history who was admitted after developing midsternal is back. This began occurring earlier last week. The episodes are fairly short-lived. Occurred predominantly with rest. He did not have associated nausea, vomiting or diaphoresis. He has a history of remote tobacco use. He has normal cholesterol. He is not hypertensive not diabetic and has no significant family history of heart disease. He is on no medications prior to presentation. Patient developed midsternal chest pain. Blood pressure was markedly elevated during the pain. He presented to the emergency room where electrocardiogram showed sinus rhythm with no ischemia. His initial serum troponin was normal at 0.03. Second troponin increased to 2.98 with third troponin at 3:15 this morning was 4.93. He still has mild chest tenderness. He denies shortness of breath. He is hemodynamically stable. His blood pressure is improved with pain relief. He is currently on aspirin, topical nitrates, metoprolol, heparin. He has a mildly elevated TSH. Lipid profile shows an LDL of 111 with an HDL of 34. CBC showed no anemia or elevated white count. Renal function is normal. Patient had a chest CT due to elevated hypertension in the ER. This revealed no dissection but calcium in his coronary arteries. Chest x-ray revealed no acute cardiopulmonary disease.  Review of Systems  Constitutional: Negative.   HENT: Negative.   Eyes: Negative.   Respiratory: Negative.   Cardiovascular: Positive for chest pain.  Gastrointestinal: Negative.   Genitourinary: Negative.   Musculoskeletal: Negative.   Skin:  Negative.   Neurological: Negative.   Endo/Heme/Allergies: Negative.   Psychiatric/Behavioral: Negative.     History reviewed. No pertinent past medical history.  Family History  Problem Relation Age of Onset  . Diabetes Mother     Social History   Social History  . Marital status: Widowed    Spouse name: N/A  . Number of children: N/A  . Years of education: N/A   Occupational History  . Not on file.   Social History Main Topics  . Smoking status: Never Smoker  . Smokeless tobacco: Never Used  . Alcohol use Yes  . Drug use: No  . Sexual activity: Not on file   Other Topics Concern  . Not on file   Social History Narrative  . No narrative on file    Past Surgical History:  Procedure Laterality Date  . ANAL FISTULOTOMY       No prescriptions prior to admission.    Physical Exam: Blood pressure (!) 156/79, pulse (!) 58, temperature 97.6 F (36.4 C), temperature source Oral, resp. rate 12, height 5\' 11"  (1.803 m), weight 93.7 kg (206 lb 8 oz), SpO2 96 %.   Wt Readings from Last 1 Encounters:  02/21/17 93.7 kg (206 lb 8 oz)     General appearance: alert and cooperative Resp: clear to auscultation bilaterally Chest wall: no tenderness Cardio: regular rate and rhythm GI: soft, non-tender; bowel sounds normal; no masses,  no organomegaly Extremities: extremities normal, atraumatic, no cyanosis or edema Pulses: 2+ and symmetric Neurologic: Grossly normal  Labs:   Lab Results  Component Value Date   WBC 6.9 02/22/2017   HGB 15.0 02/22/2017   HCT 44.9 02/22/2017   MCV 84.8 02/22/2017  PLT 209 02/22/2017    Recent Labs Lab 02/21/17 1426  NA 137  K 3.4*  CL 104  CO2 24  BUN 18  CREATININE 0.88  CALCIUM 9.1  GLUCOSE 111*   Lab Results  Component Value Date   TROPONINI 4.93 ([place]) 02/22/2017      Radiology: No acute cardiopulmonary disease on chest x-ray. Chest CT revealed no dissection or other abnormality Patient's symptoms. EKG: Sinus rhythm  with PAC and no ischemia  ASSESSMENT AND PLAN:  66 year old male with no prior cardiac history with borderline hyperlipidemia and on no medications who was admitted after several days of intermittent chest pain. He had is her episode on day of admission prompting presentation to the emergency room where was noted to be hypertensive. Electrocautery gram was unremarkable. Chest pain was improved with nitrates. Blood pressure improved with resolution of his chest pain. Chest CT revealed no dissection. There was evidence of calcium in his coronary artery tree. He has ruled in for non-ST elevation myocardial infarction with a peak troponin thus far 4.98. He is still having intermittent brief episodes of chest pain. His electrocautery gram one presentation was unremarkable. Telemetry reveals sinus rhythm. Chest x-ray revealed no acute cardiopulmonary disease. He is currently on aspirin, heparin, metoprolol. While at high intensity statin that atorvastatin 40 mg daily and continue with beta blocker heparin and aspirin and topical nitrates. If chest pain with electric cardiographic changes occurs, urgent cardiac catheterization could be considered however will continue with IV current treatment and plan elective cardiac catheterization. Further recommendations after this is completed. Echocardiogram has been ordered to evaluate wall motion and valves. Signed: [name] MD, [facility] 02/22/2017, 9:37 AM

## 2017-02-22 NOTE — Progress Notes (Addendum)
[place] [facility] [place]: Mark Wyatt    MR#:  [id number]  DATE OF BIRTH:  12-04-1950  SUBJECTIVE:  Patient Without chest pain this morning  REVIEW OF SYSTEMS:    Review of Systems  Constitutional: Negative for fever, chills weight loss HENT: Negative for ear pain, nosebleeds, congestion, facial swelling, rhinorrhea, neck pain, neck stiffness and ear discharge.   Respiratory: Negative for cough, shortness of breath, wheezing  Cardiovascular: Negative for chest pain, palpitations and leg swelling.  Gastrointestinal: Negative for heartburn, abdominal pain, vomiting, diarrhea or consitpation Genitourinary: Negative for dysuria, urgency, frequency, hematuria Musculoskeletal: Negative for back pain or joint pain Neurological: Negative for dizziness, seizures, syncope, focal weakness,  numbness and headaches.  Hematological: Does not bruise/bleed easily.  Psychiatric/Behavioral: Negative for hallucinations, confusion, dysphoric mood    Tolerating Diet:yes      DRUG ALLERGIES:   Allergies  Allergen Reactions  . Asa [Aspirin]   . Penicillins Other (See Comments)    Headache     VITALS:  Blood pressure (!) 156/79, pulse (!) 58, temperature 97.6 F (36.4 C), temperature source Oral, resp. rate 12, height 5\' 11"  (1.803 m), weight 93.7 kg (206 lb 8 oz), SpO2 96 %.  PHYSICAL EXAMINATION:  Constitutional: Appears well-developed and well-nourished. No distress. HENT: Normocephalic. Marland Kitchen Oropharynx is clear and moist.  Eyes: Conjunctivae and EOM are normal. PERRLA, no scleral icterus.  Neck: Normal ROM. Neck supple. No JVD. No tracheal deviation. CVS: RRR, S1/S2 +, no murmurs, no gallops, no carotid bruit.  Pulmonary: Effort and breath sounds normal, no stridor, rhonchi, wheezes, rales.  Abdominal: Soft. BS +,  no distension, tenderness, rebound or guarding.  Musculoskeletal: Normal range of motion. No edema and no tenderness.  Neuro:  Alert. CN 2-12 grossly intact. No focal deficits. Skin: Skin is warm and dry. No rash noted. Psychiatric: Normal mood and affect.      LABORATORY PANEL:   CBC  Recent Labs Lab 02/22/17 0315  WBC 6.9  HGB 15.0  HCT 44.9  PLT 209   ------------------------------------------------------------------------------------------------------------------  Chemistries   Recent Labs Lab 02/21/17 1426 02/21/17 2012  NA 137  --   K 3.4*  --   CL 104  --   CO2 24  --   GLUCOSE 111*  --   BUN 18  --   CREATININE 0.88  --   CALCIUM 9.1  --   MG  --  2.0   ------------------------------------------------------------------------------------------------------------------  Cardiac Enzymes  Recent Labs Lab 02/21/17 1426 02/21/17 2012 02/22/17 0315  TROPONINI <0.03 2.98* 4.93*   ------------------------------------------------------------------------------------------------------------------  RADIOLOGY:  Dg Chest 2 View  Result Date: 02/21/2017 CLINICAL DATA:  Chest pain EXAM: CHEST  2 VIEW COMPARISON:  None. FINDINGS: Normal heart size and mediastinal contours. No acute infiltrate or edema. No effusion or pneumothorax. No acute osseous findings. IMPRESSION: Negative chest. Electronically Signed   By: Monte Fantasia M.D.   On: 02/21/2017 14:27   Ct Angio Chest/abd/pel For Dissection W And/or Wo Contrast  Result Date: 02/21/2017 CLINICAL DATA:  66 year old male presenting to the emergency department with acute onset of chest pain 30 minutes prior to admission, with some associated left arm and left shoulder pain with pressure sensation in the mid thoracic region. EXAM: CT ANGIOGRAPHY CHEST, ABDOMEN AND PELVIS TECHNIQUE: Multidetector CT imaging through the chest, abdomen and pelvis was performed using the standard protocol during bolus administration of intravenous contrast. Multiplanar reconstructed images and MIPs were obtained and reviewed to evaluate  the vascular anatomy. CONTRAST:   125 mL of Isovue 370. COMPARISON:  No priors. FINDINGS: CTA CHEST FINDINGS Cardiovascular: Heart size is normal. There is no significant pericardial fluid, thickening or pericardial calcification. There is aortic atherosclerosis, as well as atherosclerosis of the great vessels of the mediastinum and the coronary arteries, including calcified atherosclerotic plaque in the left main, left anterior descending, left circumflex and right coronary arteries. Calcifications of the aortic valve. No acute abnormality of the thoracic aorta; specifically, no aneurysm or dissection. Additionally, on the precontrast images there is no crescentic high attenuation associated with the wall of the thoracic aorta to suggest acute intramural hemorrhage. Mediastinum/Nodes: No pathologically enlarged mediastinal or hilar lymph nodes. Esophagus is unremarkable in appearance. No axillary lymphadenopathy. Lungs/Pleura: No acute consolidative airspace disease. No pleural effusions. No suspicious appearing pulmonary nodules or masses. Musculoskeletal: There are no aggressive appearing lytic or blastic lesions noted in the visualized portions of the skeleton. Review of the MIP images confirms the above findings. CTA ABDOMEN AND PELVIS FINDINGS VASCULAR Aorta: Normal caliber aorta without aneurysm, dissection, vasculitis or significant stenosis. Celiac: Patent without evidence of aneurysm, dissection, vasculitis or significant stenosis. SMA: Patent without evidence of aneurysm, dissection, vasculitis or significant stenosis. Renals: All renal arteries (2 right-sided and 3 left-sided) are patent without evidence of aneurysm, dissection, vasculitis, fibromuscular dysplasia or significant stenosis. IMA: Patent without evidence of aneurysm, dissection, vasculitis or significant stenosis. Inflow: Patent without evidence of aneurysm, dissection, vasculitis or significant stenosis. Veins: No obvious venous abnormality within the limitations of this  arterial phase study. Review of the MIP images confirms the above findings. NON-VASCULAR Hepatobiliary: Diffuse low attenuation throughout the hepatic parenchyma, compatible with hepatic steatosis. No suspicious cystic or solid hepatic lesions. No intra or extrahepatic biliary ductal dilatation. Gallbladder is normal in appearance. Pancreas: No pancreatic mass. No pancreatic ductal dilatation. No pancreatic or peripancreatic fluid or inflammatory changes. Spleen: Unremarkable. Adrenals/Urinary Tract: Exophytic 3 cm low-attenuation nonenhancing lesion in the upper pole of the right kidney, compatible with a simple cyst. Left kidney and bilateral adrenal glands are normal in appearance. No hydroureteronephrosis. Urinary bladder is normal in appearance. Stomach/Bowel: The appearance of the stomach is normal. There is no pathologic dilatation of small bowel or colon. The appendix is not confidently identified and may be surgically absent. Regardless, there are no inflammatory changes noted adjacent to the cecum to suggest the presence of an acute appendicitis at this time. Lymphatic: No lymphadenopathy noted in the abdomen or pelvis. Reproductive: Prostate gland and seminal vesicles are unremarkable in appearance. Other: No significant volume of ascites.  No pneumoperitoneum. Musculoskeletal: There are no aggressive appearing lytic or blastic lesions noted in the visualized portions of the skeleton. Review of the MIP images confirms the above findings. IMPRESSION: 1. No acute abnormality in the chest, abdomen or pelvis to account for the patient's symptoms. Specifically, no evidence of acute aortic syndrome. 2. Aortic atherosclerosis, in addition to left main and 3 vessel coronary artery disease. Please note that although the presence of coronary artery calcium documents the presence of coronary artery disease, the severity of this disease and any potential stenosis cannot be assessed on this non-gated CT examination.  Assessment for potential risk factor modification, dietary therapy or pharmacologic therapy may be warranted, if clinically indicated. 3. Hepatic steatosis. 4. Additional incidental findings, as above. Electronically Signed   By: Vinnie Langton M.D.   On: 02/21/2017 16:51     ASSESSMENT AND PLAN:    66 year old male with no significant  past medical history who presents with chest pain and found to have non-ST elevation MI.  1. Non-ST elevation MI: Continue aspirin, heparin drip, nitroglycerin patch metoprolol and statin Plan for cardiac catheterization on Monday Echocardiogram pending Appreciate cardiology consultation LDL 111 Trop max 4.93  2. Hypertensive emergency due to chest pain: Blood pressure is better controlled now  3. Hypokalemia: Repeat in am   Management plans discussed with the patient and he is in agreement.  CODE STATUS: full  TOTAL TIME TAKING CARE OF THIS PATIENT: 30 minutes.  D/w dr Ubaldo Glassing   POSSIBLE D/C tuesday, DEPENDING ON CLINICAL CONDITION.   Nohelani Benning M.D on 02/22/2017 at 10:21 AM  Between 7am to 6pm - Pager - [phone] After 6pm go to [email or web address] - password [email or web address]  Office  [phone]  CC: Primary care physician; Patient, No Pcp Per  Note: This dictation was prepared with Dragon dictation along with smaller phrase technology. Any transcriptional errors that result from this process are unintentional.

## 2017-02-23 LAB — BASIC METABOLIC PANEL
Anion gap: 8 (ref 5–15)
BUN: 17 mg/dL (ref 6–20)
CALCIUM: 8.7 mg/dL — AB (ref 8.9–10.3)
CO2: 24 mmol/L (ref 22–32)
CREATININE: 0.74 mg/dL (ref 0.61–1.24)
Chloride: 104 mmol/L (ref 101–111)
GFR calc Af Amer: >60 mL/min (ref >60)
GLUCOSE: 111 mg/dL — AB (ref 65–99)
Potassium: 4 mmol/L (ref 3.5–5.1)
SODIUM: 136 mmol/L (ref 135–145)

## 2017-02-23 LAB — HEPARIN LEVEL (UNFRACTIONATED): HEPARIN UNFRACTIONATED: 0.37 [IU]/mL (ref 0.30–0.70)

## 2017-02-23 LAB — CBC
HCT: 44.9 % (ref 40.0–52.0)
Hemoglobin: 15.1 g/dL (ref 13.0–18.0)
MCH: 28.4 pg (ref 26.0–34.0)
MCHC: 33.5 g/dL (ref 32.0–36.0)
MCV: 84.7 fL (ref 80.0–100.0)
Platelets: 217 10*3/uL (ref 150–440)
RBC: 5.3 MIL/uL (ref 4.40–5.90)
RDW: 14 % (ref 11.5–14.5)
WBC: 7.9 10*3/uL (ref 3.8–10.6)

## 2017-02-23 LAB — HEMOGLOBIN A1C
HEMOGLOBIN A1C: 5.7 % — AB (ref 4.8–5.6)
Mean Plasma Glucose: 117 mg/dL

## 2017-02-23 MED ORDER — SODIUM CHLORIDE 0.9 % IV SOLN: 250.0000 mL | INTRAVENOUS | Status: DC | PRN

## 2017-02-23 MED ORDER — SODIUM CHLORIDE 0.9% FLUSH: 3.0000 mL | INTRAVENOUS | Status: DC | PRN

## 2017-02-23 MED ORDER — SODIUM CHLORIDE 0.9% FLUSH
3.0000 mL | Freq: Two times a day (BID) | INTRAVENOUS | Status: DC
  Administered 2017-02-23: 3 mL via INTRAVENOUS

## 2017-02-23 MED ORDER — SODIUM CHLORIDE 0.9 % WEIGHT BASED INFUSION
3.0000 mL/kg/h | INTRAVENOUS | Status: AC
  Administered 2017-02-24: 3 mL/kg/h via INTRAVENOUS

## 2017-02-23 MED ORDER — SODIUM CHLORIDE 0.9 % WEIGHT BASED INFUSION
1.0000 mL/kg/h | INTRAVENOUS | Status: DC
  Administered 2017-02-24: 1 mL/kg/h via INTRAVENOUS

## 2017-02-23 MED ORDER — ASPIRIN 81 MG PO CHEW
81.0000 mg | CHEWABLE_TABLET | ORAL | Status: AC
  Administered 2017-02-24: 81 mg via ORAL
  Filled 2017-02-23: qty 1

## 2017-02-23 NOTE — Progress Notes (Signed)
       [facility]  SUBJECTIVE: no complaints   Vitals:   02/22/17 2307 02/23/17 0339 02/23/17 0739 02/23/17 1428  BP: (!) 117/58 118/64 140/72 138/71  Pulse: (!) 55 65 (!) 59 66  Resp:  16 19   Temp:  98 F (36.7 C) 97.7 F (36.5 C)   TempSrc:  Oral Oral   SpO2:  92% 94% 96%  Weight:      Height:        Intake/Output Summary (Last 24 hours) at 02/23/17 1440 Last data filed at 02/23/17 1337  Gross per 24 hour  Intake           793.07 ml  Output             2100 ml  Net         -1306.93 ml    LABS: Basic Metabolic Panel:  Recent Labs  02/21/17 1426 02/21/17 2012 02/23/17 0529  NA 137  --  136  K 3.4*  --  4.0  CL 104  --  104  CO2 24  --  24  GLUCOSE 111*  --  111*  BUN 18  --  17  CREATININE 0.88  --  0.74  CALCIUM 9.1  --  8.7*  MG  --  2.0  --    Liver Function Tests: No results for input(s): AST, ALT, ALKPHOS, BILITOT, PROT, ALBUMIN in the last 72 hours. No results for input(s): LIPASE, AMYLASE in the last 72 hours. CBC:  Recent Labs  02/22/17 0315 02/23/17 0529  WBC 6.9 7.9  HGB 15.0 15.1  HCT 44.9 44.9  MCV 84.8 84.7  PLT 209 217   Cardiac Enzymes:  Recent Labs  02/21/17 1426 02/21/17 2012 02/22/17 0315  TROPONINI <0.03 2.98* 4.93*   BNP: Invalid input(s): POCBNP D-Dimer: No results for input(s): DDIMER in the last 72 hours. Hemoglobin A1C:  Recent Labs  02/21/17 2012  HGBA1C 5.7*   Fasting Lipid Panel:  Recent Labs  02/22/17 0315  CHOL 174  HDL 34*  LDLCALC 111*  TRIG 143  CHOLHDL 5.1   Thyroid Function Tests:  Recent Labs  02/22/17 0315  TSH 4.700*   Anemia Panel: No results for input(s): VITAMINB12, FOLATE, FERRITIN, TIBC, IRON, RETICCTPCT in the last 72 hours.   Physical Exam: Blood pressure 138/71, pulse 66, temperature 97.7 F (36.5 C), temperature source Oral, resp. rate 19, height 5\' 11"  (1.803 m), weight 93.7 kg (206 lb 8 oz), SpO2 96 %.   Wt Readings  from Last 1 Encounters:  02/21/17 93.7 kg (206 lb 8 oz)     General appearance: alert and cooperative Resp: clear to auscultation bilaterally Cardio: regular rate and rhythm GI: soft, non-tender; bowel sounds normal; no masses,  no organomegaly Extremities: extremities normal, atraumatic, no cyanosis or edema Neurologic: Grossly normal  TELEMETRY: Reviewed telemetry pt in nsr:  ASSESSMENT AND PLAN:  Active Problems:   Chest pain due to CAD   NSTEMI (non-ST elevated myocardial infarction) (HCC)-ruled in for nstemi. Currently asymptomatic at currently pain. Free. Echo showed preserved lv function with possible septal hypokinesis. Will proceed with cardiac cath in am to evaluate anatomy. Further recs after cath. Continue with iv heparin, asa, beta blockers, high intensity statin.     [name], MD, [facility] 02/23/2017 2:40 PM

## 2017-02-23 NOTE — Progress Notes (Signed)
[place] at [place]: Clayburn Weekly    MR#:  [id number]  DATE OF BIRTH:  Nov 22, 1950  SUBJECTIVE:    Patient is doing well. Patient has ambulated around the room without chest pain.  REVIEW OF SYSTEMS:    Review of Systems  Constitutional: Negative for fever, chills weight loss HENT: Negative for ear pain, nosebleeds, congestion, facial swelling, rhinorrhea, neck pain, neck stiffness and ear discharge.   Respiratory: Negative for cough, shortness of breath, wheezing  Cardiovascular: Negative for chest pain, palpitations and leg swelling.  Gastrointestinal: Negative for heartburn, abdominal pain, vomiting, diarrhea or consitpation Genitourinary: Negative for dysuria, urgency, frequency, hematuria Musculoskeletal: Negative for back pain or joint pain Neurological: Negative for dizziness, seizures, syncope, focal weakness,  numbness and headaches.  Hematological: Does not bruise/bleed easily.  Psychiatric/Behavioral: Negative for hallucinations, confusion, dysphoric mood    Tolerating Diet:yes      DRUG ALLERGIES:   Allergies  Allergen Reactions  . Asa [Aspirin]   . Penicillins Other (See Comments)    Headache     VITALS:  Blood pressure 140/72, pulse (!) 59, temperature 97.7 F (36.5 C), temperature source Oral, resp. rate 19, height 5\' 11"  (1.803 m), weight 93.7 kg (206 lb 8 oz), SpO2 94 %.  PHYSICAL EXAMINATION:  Constitutional: Appears well-developed and well-nourished. No distress. HENT: Normocephalic. Marland Kitchen Oropharynx is clear and moist.  Eyes: Conjunctivae and EOM are normal. PERRLA, no scleral icterus.  Neck: Normal ROM. Neck supple. No JVD. No tracheal deviation. CVS: RRR, S1/S2 +, no murmurs, no gallops, no carotid bruit.  Pulmonary: Effort and breath sounds normal, no stridor, rhonchi, wheezes, rales.  Abdominal: Soft. BS +,  no distension, tenderness, rebound or guarding.  Musculoskeletal: Normal range of motion.  No edema and no tenderness.  Neuro: Alert. CN 2-12 grossly intact. No focal deficits. Skin: Skin is warm and dry. No rash noted. Psychiatric: Normal mood and affect.      LABORATORY PANEL:   CBC  Recent Labs Lab 02/23/17 0529  WBC 7.9  HGB 15.1  HCT 44.9  PLT 217   ------------------------------------------------------------------------------------------------------------------  Chemistries   Recent Labs Lab 02/21/17 2012 02/23/17 0529  NA  --  136  K  --  4.0  CL  --  104  CO2  --  24  GLUCOSE  --  111*  BUN  --  17  CREATININE  --  0.74  CALCIUM  --  8.7*  MG 2.0  --    ------------------------------------------------------------------------------------------------------------------  Cardiac Enzymes  Recent Labs Lab 02/21/17 1426 02/21/17 2012 02/22/17 0315  TROPONINI <0.03 2.98* 4.93*   ------------------------------------------------------------------------------------------------------------------  RADIOLOGY:  Dg Chest 2 View  Result Date: 02/21/2017 CLINICAL DATA:  Chest pain EXAM: CHEST  2 VIEW COMPARISON:  None. FINDINGS: Normal heart size and mediastinal contours. No acute infiltrate or edema. No effusion or pneumothorax. No acute osseous findings. IMPRESSION: Negative chest. Electronically Signed   By: Monte Fantasia M.D.   On: 02/21/2017 14:27   Ct Angio Chest/abd/pel For Dissection W And/or Wo Contrast  Result Date: 02/21/2017 CLINICAL DATA:  66 year old male presenting to the emergency department with acute onset of chest pain 30 minutes prior to admission, with some associated left arm and left shoulder pain with pressure sensation in the mid thoracic region. EXAM: CT ANGIOGRAPHY CHEST, ABDOMEN AND PELVIS TECHNIQUE: Multidetector CT imaging through the chest, abdomen and pelvis was performed using the standard protocol during bolus administration of intravenous contrast. Multiplanar reconstructed images  and MIPs were obtained and reviewed to  evaluate the vascular anatomy. CONTRAST:  125 mL of Isovue 370. COMPARISON:  No priors. FINDINGS: CTA CHEST FINDINGS Cardiovascular: Heart size is normal. There is no significant pericardial fluid, thickening or pericardial calcification. There is aortic atherosclerosis, as well as atherosclerosis of the great vessels of the mediastinum and the coronary arteries, including calcified atherosclerotic plaque in the left main, left anterior descending, left circumflex and right coronary arteries. Calcifications of the aortic valve. No acute abnormality of the thoracic aorta; specifically, no aneurysm or dissection. Additionally, on the precontrast images there is no crescentic high attenuation associated with the wall of the thoracic aorta to suggest acute intramural hemorrhage. Mediastinum/Nodes: No pathologically enlarged mediastinal or hilar lymph nodes. Esophagus is unremarkable in appearance. No axillary lymphadenopathy. Lungs/Pleura: No acute consolidative airspace disease. No pleural effusions. No suspicious appearing pulmonary nodules or masses. Musculoskeletal: There are no aggressive appearing lytic or blastic lesions noted in the visualized portions of the skeleton. Review of the MIP images confirms the above findings. CTA ABDOMEN AND PELVIS FINDINGS VASCULAR Aorta: Normal caliber aorta without aneurysm, dissection, vasculitis or significant stenosis. Celiac: Patent without evidence of aneurysm, dissection, vasculitis or significant stenosis. SMA: Patent without evidence of aneurysm, dissection, vasculitis or significant stenosis. Renals: All renal arteries (2 right-sided and 3 left-sided) are patent without evidence of aneurysm, dissection, vasculitis, fibromuscular dysplasia or significant stenosis. IMA: Patent without evidence of aneurysm, dissection, vasculitis or significant stenosis. Inflow: Patent without evidence of aneurysm, dissection, vasculitis or significant stenosis. Veins: No obvious venous  abnormality within the limitations of this arterial phase study. Review of the MIP images confirms the above findings. NON-VASCULAR Hepatobiliary: Diffuse low attenuation throughout the hepatic parenchyma, compatible with hepatic steatosis. No suspicious cystic or solid hepatic lesions. No intra or extrahepatic biliary ductal dilatation. Gallbladder is normal in appearance. Pancreas: No pancreatic mass. No pancreatic ductal dilatation. No pancreatic or peripancreatic fluid or inflammatory changes. Spleen: Unremarkable. Adrenals/Urinary Tract: Exophytic 3 cm low-attenuation nonenhancing lesion in the upper pole of the right kidney, compatible with a simple cyst. Left kidney and bilateral adrenal glands are normal in appearance. No hydroureteronephrosis. Urinary bladder is normal in appearance. Stomach/Bowel: The appearance of the stomach is normal. There is no pathologic dilatation of small bowel or colon. The appendix is not confidently identified and may be surgically absent. Regardless, there are no inflammatory changes noted adjacent to the cecum to suggest the presence of an acute appendicitis at this time. Lymphatic: No lymphadenopathy noted in the abdomen or pelvis. Reproductive: Prostate gland and seminal vesicles are unremarkable in appearance. Other: No significant volume of ascites.  No pneumoperitoneum. Musculoskeletal: There are no aggressive appearing lytic or blastic lesions noted in the visualized portions of the skeleton. Review of the MIP images confirms the above findings. IMPRESSION: 1. No acute abnormality in the chest, abdomen or pelvis to account for the patient's symptoms. Specifically, no evidence of acute aortic syndrome. 2. Aortic atherosclerosis, in addition to left main and 3 vessel coronary artery disease. Please note that although the presence of coronary artery calcium documents the presence of coronary artery disease, the severity of this disease and any potential stenosis cannot be  assessed on this non-gated CT examination. Assessment for potential risk factor modification, dietary therapy or pharmacologic therapy may be warranted, if clinically indicated. 3. Hepatic steatosis. 4. Additional incidental findings, as above. Electronically Signed   By: Vinnie Langton M.D.   On: 02/21/2017 16:51     ASSESSMENT AND PLAN:  66 year old male with no significant past medical history who presents with chest pain and found to have non-ST elevation MI.  1. Non-ST elevation MI: Continue aspirin, heparin drip, nitroglycerin patch metoprolol and statin Plan for cardiac catheterization on Monday Echocardiogram pending Appreciate cardiology consultation LDL 111 Trop max 4.93   2. Hypertensive emergency due to chest pain: Blood pressure is controlled. 3. Hypokalemia: This is improved with replacement.   Management plans discussed with the patient and he is in agreement.  CODE STATUS: full  TOTAL TIME TAKING CARE OF THIS PATIENT: 24 minutes.   POSSIBLE D/C tuesday, DEPENDING ON CLINICAL CONDITION.   Alberto Pina M.D on 02/23/2017 at 8:12 AM  Between 7am to 6pm - Pager - [phone] After 6pm go to [email or web address] - password [email or web address] [facility]  Office  [phone]  CC: Primary care physician; Patient, No Pcp Per  Note: This dictation was prepared with Dragon dictation along with smaller phrase technology. Any transcriptional errors that result from this process are unintentional.

## 2017-02-24 ENCOUNTER — Encounter
Admission: EM | Disposition: A | Discharge status: Home / Self Care | Payer: Self-pay | Source: Home / Self Care | Attending: Internal Medicine

## 2017-02-24 DIAGNOSIS — Z136 Encounter for screening for cardiovascular disorders: Secondary | ICD-10-CM | POA: Diagnosis not present

## 2017-02-24 DIAGNOSIS — I2510 Atherosclerotic heart disease of native coronary artery without angina pectoris: Secondary | ICD-10-CM

## 2017-02-24 HISTORY — DX: Atherosclerotic heart disease of native coronary artery without angina pectoris: I25.10

## 2017-02-24 HISTORY — PX: CORONARY STENT INTERVENTION: CATH118234

## 2017-02-24 HISTORY — PX: LEFT HEART CATH AND CORONARY ANGIOGRAPHY: CATH118249

## 2017-02-24 LAB — CBC
HCT: 44.4 % (ref 40.0–52.0)
HEMOGLOBIN: 14.9 g/dL (ref 13.0–18.0)
MCH: 28.6 pg (ref 26.0–34.0)
MCHC: 33.5 g/dL (ref 32.0–36.0)
MCV: 85.2 fL (ref 80.0–100.0)
PLATELETS: 206 10*3/uL (ref 150–440)
RBC: 5.21 MIL/uL (ref 4.40–5.90)
RDW: 13.9 % (ref 11.5–14.5)
WBC: 5.9 10*3/uL (ref 3.8–10.6)

## 2017-02-24 LAB — PROTIME-INR
INR: 1.04
PROTHROMBIN TIME: 13.6 s (ref 11.4–15.2)

## 2017-02-24 LAB — BASIC METABOLIC PANEL
ANION GAP: 7 (ref 5–15)
BUN: 17 mg/dL (ref 6–20)
CHLORIDE: 107 mmol/L (ref 101–111)
CO2: 25 mmol/L (ref 22–32)
Calcium: 8.7 mg/dL — ABNORMAL LOW (ref 8.9–10.3)
Creatinine, Ser: 0.87 mg/dL (ref 0.61–1.24)
GFR calc Af Amer: >60 mL/min (ref >60)
GLUCOSE: 114 mg/dL — AB (ref 65–99)
POTASSIUM: 4 mmol/L (ref 3.5–5.1)
SODIUM: 139 mmol/L (ref 135–145)

## 2017-02-24 LAB — POCT ACTIVATED CLOTTING TIME: ACTIVATED CLOTTING TIME: 323 s

## 2017-02-24 LAB — HEPARIN LEVEL (UNFRACTIONATED): Heparin Unfractionated: 0.36 IU/mL (ref 0.30–0.70)

## 2017-02-24 SURGERY — LEFT HEART CATH AND CORONARY ANGIOGRAPHY
Anesthesia: Moderate Sedation

## 2017-02-24 MED ORDER — SODIUM CHLORIDE 0.9 % WEIGHT BASED INFUSION
1.0000 mL/kg/h | INTRAVENOUS | Status: AC
  Administered 2017-02-24: 1 mL/kg/h via INTRAVENOUS

## 2017-02-24 MED ORDER — BIVALIRUDIN BOLUS VIA INFUSION - CUPID
INTRAVENOUS | Status: DC | PRN
  Administered 2017-02-24: 69.075 mg via INTRAVENOUS

## 2017-02-24 MED ORDER — IOPAMIDOL (ISOVUE-300) INJECTION 61%
INTRAVENOUS | Status: DC | PRN
  Administered 2017-02-24: 145 mL via INTRA_ARTERIAL

## 2017-02-24 MED ORDER — SODIUM CHLORIDE 0.9% FLUSH: 3.0000 mL | INTRAVENOUS | Status: DC | PRN

## 2017-02-24 MED ORDER — LIDOCAINE HCL (PF) 1 % IJ SOLN
INTRAMUSCULAR | Status: AC
  Filled 2017-02-24: qty 30

## 2017-02-24 MED ORDER — HYDRALAZINE HCL 20 MG/ML IJ SOLN: 5.0000 mg | INTRAMUSCULAR | Status: AC | PRN

## 2017-02-24 MED ORDER — ACETAMINOPHEN 325 MG PO TABS: 650.0000 mg | ORAL_TABLET | ORAL | Status: DC | PRN

## 2017-02-24 MED ORDER — LABETALOL HCL 5 MG/ML IV SOLN: 10.0000 mg | INTRAVENOUS | Status: AC | PRN

## 2017-02-24 MED ORDER — ASPIRIN 81 MG PO CHEW
CHEWABLE_TABLET | ORAL | Status: AC
  Filled 2017-02-24: qty 3

## 2017-02-24 MED ORDER — CLOPIDOGREL BISULFATE 75 MG PO TABS
ORAL_TABLET | ORAL | Status: AC
  Filled 2017-02-24: qty 8

## 2017-02-24 MED ORDER — MIDAZOLAM HCL 2 MG/2ML IJ SOLN
INTRAMUSCULAR | Status: AC
  Filled 2017-02-24: qty 2

## 2017-02-24 MED ORDER — HEPARIN (PORCINE) IN NACL 2-0.9 UNIT/ML-% IJ SOLN
INTRAMUSCULAR | Status: AC
  Filled 2017-02-24: qty 500

## 2017-02-24 MED ORDER — SODIUM CHLORIDE 0.9 % IV SOLN
0.2500 mg/kg/h | INTRAVENOUS | Status: AC
  Filled 2017-02-24: qty 250

## 2017-02-24 MED ORDER — CLOPIDOGREL BISULFATE 75 MG PO TABS
ORAL_TABLET | ORAL | Status: DC | PRN
  Administered 2017-02-24: 600 mg via ORAL

## 2017-02-24 MED ORDER — SODIUM CHLORIDE 0.9% FLUSH
3.0000 mL | Freq: Two times a day (BID) | INTRAVENOUS | Status: DC
  Administered 2017-02-24 – 2017-02-25 (×2): 3 mL via INTRAVENOUS

## 2017-02-24 MED ORDER — ONDANSETRON HCL 4 MG/2ML IJ SOLN: 4.0000 mg | Freq: Four times a day (QID) | INTRAMUSCULAR | Status: DC | PRN

## 2017-02-24 MED ORDER — FENTANYL CITRATE (PF) 100 MCG/2ML IJ SOLN
INTRAMUSCULAR | Status: AC
  Filled 2017-02-24: qty 2

## 2017-02-24 MED ORDER — BIVALIRUDIN TRIFLUOROACETATE 250 MG IV SOLR
INTRAVENOUS | Status: AC
  Filled 2017-02-24: qty 250

## 2017-02-24 MED ORDER — SODIUM CHLORIDE 0.9 % IV SOLN: 250.0000 mL | INTRAVENOUS | Status: DC | PRN

## 2017-02-24 MED ORDER — IOPAMIDOL (ISOVUE-300) INJECTION 61%
INTRAVENOUS | Status: DC | PRN
  Administered 2017-02-24: 95 mL via INTRA_ARTERIAL

## 2017-02-24 MED ORDER — FENTANYL CITRATE (PF) 100 MCG/2ML IJ SOLN
INTRAMUSCULAR | Status: DC | PRN
  Administered 2017-02-24: 50 ug via INTRAVENOUS

## 2017-02-24 MED ORDER — CLOPIDOGREL BISULFATE 75 MG PO TABS
75.0000 mg | ORAL_TABLET | Freq: Every day | ORAL | Status: DC
  Administered 2017-02-25: 75 mg via ORAL
  Filled 2017-02-24: qty 1

## 2017-02-24 MED ORDER — NITROGLYCERIN 1 MG/10 ML FOR IR/CATH LAB
INTRA_ARTERIAL | Status: DC | PRN
  Administered 2017-02-24: 200 ug via INTRACORONARY

## 2017-02-24 MED ORDER — SODIUM CHLORIDE 0.9 % IV SOLN
INTRAVENOUS | Status: DC | PRN
  Administered 2017-02-24: 1.75 mg/kg/h via INTRAVENOUS

## 2017-02-24 MED ORDER — MIDAZOLAM HCL 2 MG/2ML IJ SOLN
INTRAMUSCULAR | Status: DC | PRN
  Administered 2017-02-24: 1 mg via INTRAVENOUS

## 2017-02-24 MED ORDER — ASPIRIN 81 MG PO CHEW
81.0000 mg | CHEWABLE_TABLET | Freq: Every day | ORAL | Status: DC
  Administered 2017-02-24 – 2017-02-25 (×2): 81 mg via ORAL
  Filled 2017-02-24 (×2): qty 1

## 2017-02-24 MED ORDER — ASPIRIN 81 MG PO CHEW
CHEWABLE_TABLET | ORAL | Status: DC | PRN
  Administered 2017-02-24: 243 mg via ORAL

## 2017-02-24 MED ORDER — NITROGLYCERIN 5 MG/ML IV SOLN
INTRAVENOUS | Status: AC
  Filled 2017-02-24: qty 10

## 2017-02-24 MED ORDER — BIVALIRUDIN TRIFLUOROACETATE 250 MG IV SOLR
INTRAVENOUS | Status: AC
Start: 2017-02-24 — End: 2017-02-24
  Filled 2017-02-24: qty 250

## 2017-02-24 SURGICAL SUPPLY — 17 items
BALLN TREK RX 2.5X15 (BALLOONS) ×3
BALLN ~~LOC~~ TREK RX 3.0X15 (BALLOONS) ×3
BALLOON TREK RX 2.5X15 (BALLOONS) ×1 IMPLANT
BALLOON ~~LOC~~ TREK RX 3.0X15 (BALLOONS) ×1 IMPLANT
CATH INFINITI 5FR ANG PIGTAIL (CATHETERS) ×3 IMPLANT
CATH INFINITI 5FR JL4 (CATHETERS) ×3 IMPLANT
CATH INFINITI JR4 5F (CATHETERS) ×6 IMPLANT
CATH VISTA GUIDE 6FR JR4 (CATHETERS) ×3 IMPLANT
DEVICE INFLAT 30 PLUS (MISCELLANEOUS) ×3 IMPLANT
GUIDEWIRE 3MM J TIP .035 145 (WIRE) ×3 IMPLANT
KIT MANI 3VAL PERCEP (MISCELLANEOUS) ×3 IMPLANT
NEEDLE PERC 18GX7CM (NEEDLE) ×3 IMPLANT
PACK CARDIAC CATH (CUSTOM PROCEDURE TRAY) ×3 IMPLANT
SHEATH AVANTI 5FR X 11CM (SHEATH) ×3 IMPLANT
SHEATH AVANTI 6FR X 11CM (SHEATH) ×3 IMPLANT
STENT XIENCE ALPINE RX 2.5X38 (Permanent Stent) ×3 IMPLANT
WIRE G HI TQ BMW 190 (WIRE) ×3 IMPLANT

## 2017-02-24 NOTE — Progress Notes (Signed)
Report called to Richardson Landry, RN on pt. Floor. Angiomax gtt. To be off at 13:20 per orders. Right groin without complications at site. No cardiac or subjective c/o verbalized.

## 2017-02-24 NOTE — Progress Notes (Signed)
ANTICOAGULATION CONSULT NOTE - FOLLOW UP   Pharmacy Consult for heparin bolus and drip Indication: chest pain/ACS  Allergies  Allergen Reactions  . Asa [Aspirin]   . Penicillins Other (See Comments)    Headache     Patient Measurements: Height: 5\' 11"  (180.3 cm) Weight: 203 lb (92.1 kg) IBW/kg (Calculated) : 75.3 Heparin Dosing Weight: 93 kg  Vital Signs: Temp: 97.6 F (36.4 C) (05/07 0424) Temp Source: Oral (05/07 0424) BP: 121/68 (05/07 0424) Pulse Rate: 53 (05/07 0424)  Labs:  Recent Labs  02/21/17 1426 02/21/17 2012 02/22/17 0315  02/22/17 2336 02/23/17 0529 02/24/17 0409  HGB 16.0  --  15.0  --   --  15.1 14.9  HCT 47.0  --  44.9  --   --  44.9 44.4  PLT 226  --  209  --   --  217 206  APTT  --  29  --   --   --   --   --   LABPROT  --  13.5  --   --   --   --  13.6  INR  --  1.03  --   --   --   --  1.04  HEPARINUNFRC  --   --  0.26*  < > 0.38 0.37 0.36  CREATININE 0.88  --   --   --   --  0.74 0.87  TROPONINI <0.03 2.98* 4.93*  --   --   --   --   < > = values in this interval not displayed.  Estimated Creatinine Clearance: 96.9 mL/min (by C-G formula based on SCr of 0.87 mg/dL).   Medical History: History reviewed. No pertinent past medical history.  Medications:  Scheduled:  . aspirin  81 mg Oral Pre-Cath  . aspirin EC  81 mg Oral Daily  . atorvastatin  40 mg Oral q1800  . metoprolol tartrate  25 mg Oral BID  . nitroGLYCERIN  1 inch Topical Q6H  . sodium chloride flush  3 mL Intravenous Q12H   Infusions:  . sodium chloride    . sodium chloride 1 mL/kg/hr (02/24/17 0113)  . heparin 1,400 Units/hr (02/24/17 0011)    Assessment: Pharmacy has been consulted to dose heparin bolus an drip in this 36 yoM with ACS. Per med rec, patient does not take any medication prior to admission. Baseline labs have been ordered. Plan for cardiac cath in am.  Goal of Therapy:  Heparin level 0.3-0.7 units/ml Monitor platelets by anticoagulation protocol:  Yes   Plan:  Will give 1400 units x 1 bolus. Will increase heparin gtt rate to 1400 units/hr. Will recheck heparin level @ 17:00.   5/5:  HL @ 17:30 = 0.50.  Will continue this pt on current rate and recheck HL in 6 hrs on 5/5 @ 23:30.   5/5 23:30 heparin level 0.38. Continue current regimen and recheck with AM labs.  5/6 AM heparin level 0.37. Continue current regimen. Recheck CBC and heparin level with tomorrow AM labs.  5/7 AM heparin level 0.36. Continue current regimen. Recheck CBC and heparin level with tomorrow AM labs.    Sim Boast, PharmD, BCPS  02/24/17 4:52 AM

## 2017-02-24 NOTE — Progress Notes (Signed)
Patient off unit for cardiac procedure. Will resume care upon return. Mark Wyatt  

## 2017-02-24 NOTE — Progress Notes (Signed)
[place] at [place]: Mark Wyatt    MR#:  [id number]  DATE OF BIRTH:  Nov 13, 1950  SUBJECTIVE:   Going for cath this morning. No chest pain overnight. No acute events on telemetry  REVIEW OF SYSTEMS:    Review of Systems  Constitutional: Negative for fever, chills weight loss HENT: Negative for ear pain, nosebleeds, congestion, facial swelling, rhinorrhea, neck pain, neck stiffness and ear discharge.   Respiratory: Negative for cough, shortness of breath, wheezing  Cardiovascular: Negative for chest pain, palpitations and leg swelling.  Gastrointestinal: Negative for heartburn, abdominal pain, vomiting, diarrhea or consitpation Genitourinary: Negative for dysuria, urgency, frequency, hematuria Musculoskeletal: Negative for back pain or joint pain Neurological: Negative for dizziness, seizures, syncope, focal weakness,  numbness and headaches.  Hematological: Does not bruise/bleed easily.  Psychiatric/Behavioral: Negative for hallucinations, confusion, dysphoric mood    Tolerating Diet: Nothing by mouth      DRUG ALLERGIES:   Allergies  Allergen Reactions  . Asa [Aspirin]   . Penicillins Other (See Comments)    Headache     VITALS:  Blood pressure 121/68, pulse (!) 53, temperature 97.6 F (36.4 C), temperature source Oral, resp. rate 18, height 5\' 11"  (1.803 m), weight 92.1 kg (203 lb), SpO2 95 %.  PHYSICAL EXAMINATION:  Constitutional: Appears well-developed and well-nourished. No distress. HENT: Normocephalic. Marland Kitchen Oropharynx is clear and moist.  Eyes: Conjunctivae and EOM are normal. PERRLA, no scleral icterus.  Neck: Normal ROM. Neck supple. No JVD. No tracheal deviation. CVS: RRR, S1/S2 +, no murmurs, no gallops, no carotid bruit.  Pulmonary: Effort and breath sounds normal, no stridor, rhonchi, wheezes, rales.  Abdominal: Soft. BS +,  no distension, tenderness, rebound or guarding.  Musculoskeletal: Normal range  of motion. No edema and no tenderness.  Neuro: Alert. CN 2-12 grossly intact. No focal deficits. Skin: Skin is warm and dry. No rash noted. Psychiatric: Normal mood and affect.      LABORATORY PANEL:   CBC  Recent Labs Lab 02/24/17 0409  WBC 5.9  HGB 14.9  HCT 44.4  PLT 206   ------------------------------------------------------------------------------------------------------------------  Chemistries   Recent Labs Lab 02/21/17 2012  02/24/17 0409  NA  --   < > 139  K  --   < > 4.0  CL  --   < > 107  CO2  --   < > 25  GLUCOSE  --   < > 114*  BUN  --   < > 17  CREATININE  --   < > 0.87  CALCIUM  --   < > 8.7*  MG 2.0  --   --   < > = values in this interval not displayed. ------------------------------------------------------------------------------------------------------------------  Cardiac Enzymes  Recent Labs Lab 02/21/17 1426 02/21/17 2012 02/22/17 0315  TROPONINI <0.03 2.98* 4.93*   ------------------------------------------------------------------------------------------------------------------  RADIOLOGY:  No results found.   ASSESSMENT AND PLAN:    66 year old male with no significant past medical history who presents with chest pain and found to have non-ST elevation MI.  1. Non-ST elevation MI: Continue aspirin, heparin drip, nitroglycerin patch metoprolol and statin Plan for cardiac catheterization This morning Echocardiogram shows EF 50-55% with Hypokinesis of   the anteroseptal myocardium. Appreciate cardiology consultation LDL 111 Trop max 4.93   2.HTN: Blood pressure is controlled. Continue metoprolol 3. Hypokalemia: This has improved with replacement.  4. Hyperglycemia: A1c is 5.7  5. Slightly elevated TSH of 4.7: Patient should have repeat TSH in  3 weeks. Management plans discussed with the patient and he is in agreement.  CODE STATUS: full  TOTAL TIME TAKING CARE OF THIS PATIENT: 21 minutes.   POSSIBLE D/C tuesday,  DEPENDING ON CLINICAL CONDITION.   Haylo Fake M.D on 02/24/2017 at 6:34 AM  Between 7am to 6pm - Pager - [phone] After 6pm go to [email or web address] - password [email or web address] [facility]  Office  [phone]  CC: Primary care physician; Patient, No Pcp Per  Note: This dictation was prepared with Dragon dictation along with smaller phrase technology. Any transcriptional errors that result from this process are unintentional.

## 2017-02-25 ENCOUNTER — Encounter: Payer: Self-pay | Admitting: Cardiology

## 2017-02-25 LAB — BASIC METABOLIC PANEL
ANION GAP: 6 (ref 5–15)
BUN: 13 mg/dL (ref 6–20)
CALCIUM: 8.8 mg/dL — AB (ref 8.9–10.3)
CO2: 25 mmol/L (ref 22–32)
CREATININE: 0.91 mg/dL (ref 0.61–1.24)
Chloride: 108 mmol/L (ref 101–111)
GLUCOSE: 114 mg/dL — AB (ref 65–99)
Potassium: 3.9 mmol/L (ref 3.5–5.1)
Sodium: 139 mmol/L (ref 135–145)

## 2017-02-25 LAB — CBC
HCT: 43.6 % (ref 40.0–52.0)
Hemoglobin: 14.7 g/dL (ref 13.0–18.0)
MCH: 28.6 pg (ref 26.0–34.0)
MCHC: 33.8 g/dL (ref 32.0–36.0)
MCV: 84.6 fL (ref 80.0–100.0)
PLATELETS: 200 10*3/uL (ref 150–440)
RBC: 5.15 MIL/uL (ref 4.40–5.90)
RDW: 13.9 % (ref 11.5–14.5)
WBC: 6.6 10*3/uL (ref 3.8–10.6)

## 2017-02-25 MED ORDER — METOPROLOL TARTRATE 25 MG PO TABS: 25.0000 mg | ORAL_TABLET | Freq: Two times a day (BID) | ORAL | 0 refills | Status: DC

## 2017-02-25 MED ORDER — CLOPIDOGREL BISULFATE 75 MG PO TABS: 75.0000 mg | ORAL_TABLET | Freq: Every day | ORAL | 0 refills | Status: DC

## 2017-02-25 MED ORDER — ATORVASTATIN CALCIUM 40 MG PO TABS: 40.0000 mg | ORAL_TABLET | Freq: Every day | ORAL | 0 refills | Status: DC

## 2017-02-25 MED ORDER — NITROGLYCERIN 0.4 MG SL SUBL: 0.4000 mg | SUBLINGUAL_TABLET | SUBLINGUAL | 0 refills | Status: DC | PRN

## 2017-02-25 MED ORDER — ASPIRIN 81 MG PO CHEW: 81.0000 mg | CHEWABLE_TABLET | Freq: Every day | ORAL | Status: DC

## 2017-02-25 NOTE — Discharge Summary (Signed)
[place] at [place]: Mark Wyatt    MR#:  [id number]  DATE OF BIRTH:  04-01-1951  DATE OF ADMISSION:  02/21/2017 ADMITTING PHYSICIAN: Demetrios Loll, MD  DATE OF DISCHARGE: 02/25/2017  PRIMARY CARE PHYSICIAN: Patient, No Pcp Per    ADMISSION DIAGNOSIS:  chest pain nstemi  DISCHARGE DIAGNOSIS:  Active Problems:   Chest pain due to CAD   NSTEMI (non-ST elevated myocardial infarction) ([place])   SECONDARY DIAGNOSIS:  History reviewed. No pertinent past medical history.  HOSPITAL COURSE:   66 year old male with no significant past medical history who presents with chest pain and found to have non-ST elevation MI.  1. Non-ST elevation MI: Continue aspirin, heparin drip, nitroglycerin patch metoprolol and statin He underwent cath and had DES PCI to RCA Echocardiogram shows EF 50-55% with Hypokinesis of the anteroseptal myocardium. Appreciate cardiology consultation LDL 111 Trop max 4.93 He was discharged on ASA, Plavix, NTG SL, statin and metoprolol  2.HTN: Blood pressure is controlled. Continue metoprolol  3. Hypokalemia: This has improved with replacement.  4. Hyperglycemia: A1c is 5.7  5. Slightly elevated TSH of 4.7: Patient should have repeat TSH in 3 weeks.   DISCHARGE CONDITIONS AND DIET:   Stable Cardiac diet  CONSULTS OBTAINED:  Treatment Team:  [name], MD  DRUG ALLERGIES:   Allergies  Allergen Reactions  . Asa [Aspirin]   . Penicillins Other (See Comments)    Headache     DISCHARGE MEDICATIONS:   Current Discharge Medication List    START taking these medications   Details  aspirin 81 MG chewable tablet Chew 1 tablet (81 mg total) by mouth daily.    atorvastatin (LIPITOR) 40 MG tablet Take 1 tablet (40 mg total) by mouth daily at 6 PM. Qty: 30 tablet, Refills: 0    clopidogrel (PLAVIX) 75 MG tablet Take 1 tablet (75 mg total) by mouth daily with breakfast. Qty: 30 tablet, Refills: 0    metoprolol tartrate (LOPRESSOR) 25 MG tablet Take 1 tablet (25 mg total) by mouth 2 (two) times daily. Qty: 60 tablet, Refills: 0    nitroGLYCERIN (NITROSTAT) 0.4 MG SL tablet Place 1 tablet (0.4 mg total) under the tongue every 5 (five) minutes as needed for chest pain. Qty: 30 tablet, Refills: 0          Today   CHIEF COMPLAINT:  Doing well no CP or SOB   VITAL SIGNS:  Blood pressure 125/71, pulse (!) 56, temperature 97.8 F (36.6 C), temperature source Oral, resp. rate 18, height 5\' 11"  (1.803 m), weight 92.1 kg (203 lb), SpO2 94 %.   REVIEW OF SYSTEMS:  Review of Systems  Constitutional: Negative.  Negative for chills, fever and malaise/fatigue.  HENT: Negative.  Negative for ear discharge, ear pain, hearing loss, nosebleeds and sore throat.   Eyes: Negative.  Negative for blurred vision and pain.  Respiratory: Negative.  Negative for cough, hemoptysis, shortness of breath and wheezing.   Cardiovascular: Negative.  Negative for chest pain, palpitations and leg swelling.  Gastrointestinal: Negative.  Negative for abdominal pain, blood in stool, diarrhea, nausea and vomiting.  Genitourinary: Negative.  Negative for dysuria.  Musculoskeletal: Negative.  Negative for back pain.  Skin: Negative.   Neurological: Negative for dizziness, tremors, speech change, focal weakness, seizures and headaches.  Endo/Heme/Allergies: Negative.  Does not bruise/bleed easily.  Psychiatric/Behavioral: Negative.  Negative for depression, hallucinations and suicidal ideas.     PHYSICAL EXAMINATION:  GENERAL:  66  y.o.-year-old patient lying in the bed with no acute distress.  NECK:  Supple, no jugular venous distention. No thyroid enlargement, no tenderness.  LUNGS: Normal breath sounds bilaterally, no wheezing, rales,rhonchi  No use of accessory muscles of respiration.  CARDIOVASCULAR: S1, S2 normal. No murmurs, rubs, or gallops.  ABDOMEN: Soft, non-tender, non-distended. Bowel sounds  present. No organomegaly or mass.  EXTREMITIES: No pedal edema, cyanosis, or clubbing.  PSYCHIATRIC: The patient is alert and oriented x 3.  SKIN: No obvious rash, lesion, or ulcer.   DATA REVIEW:   CBC  Recent Labs Lab 02/25/17 0412  WBC 6.6  HGB 14.7  HCT 43.6  PLT 200    Chemistries   Recent Labs Lab 02/21/17 2012  02/25/17 0412  NA  --   < > 139  K  --   < > 3.9  CL  --   < > 108  CO2  --   < > 25  GLUCOSE  --   < > 114*  BUN  --   < > 13  CREATININE  --   < > 0.91  CALCIUM  --   < > 8.8*  MG 2.0  --   --   < > = values in this interval not displayed.  Cardiac Enzymes  Recent Labs Lab 02/21/17 1426 02/21/17 2012 02/22/17 0315  TROPONINI <0.03 2.98* 4.93*    Microbiology Results  @[id number] @  RADIOLOGY:  No results found.    Current Discharge Medication List    START taking these medications   Details  aspirin 81 MG chewable tablet Chew 1 tablet (81 mg total) by mouth daily.    atorvastatin (LIPITOR) 40 MG tablet Take 1 tablet (40 mg total) by mouth daily at 6 PM. Qty: 30 tablet, Refills: 0    clopidogrel (PLAVIX) 75 MG tablet Take 1 tablet (75 mg total) by mouth daily with breakfast. Qty: 30 tablet, Refills: 0    metoprolol tartrate (LOPRESSOR) 25 MG tablet Take 1 tablet (25 mg total) by mouth 2 (two) times daily. Qty: 60 tablet, Refills: 0    nitroGLYCERIN (NITROSTAT) 0.4 MG SL tablet Place 1 tablet (0.4 mg total) under the tongue every 5 (five) minutes as needed for chest pain. Qty: 30 tablet, Refills: 0           Management plans discussed with the patient and he is in agreement. Stable for discharge home  Patient should follow up with dr Ubaldo Glassing  CODE STATUS:     Code Status Orders        Start     Ordered   02/21/17 2003  Full code  Continuous     02/21/17 2002    Code Status History    Date Active Date Inactive Code Status Order ID Comments User Context   This patient has a current code status but no historical  code status.    Advance Directive Documentation     Most Recent Value  Type of Advance Directive  Healthcare Power of Attorney, Living will  Pre-existing out of facility DNR order (yellow form or pink MOST form)  -  "MOST" Form in Place?  -      TOTAL TIME TAKING CARE OF THIS PATIENT: 37 minutes.    Note: This dictation was prepared with Dragon dictation along with smaller phrase technology. Any transcriptional errors that result from this process are unintentional.  Ernesto Lashway M.D on 02/25/2017 at 8:11 AM  Between 7am to 6pm - Pager - [phone]  After 6pm go to [email or web address] - password [email or web address] [facility]  Office  [phone]  CC: Primary care physician; Patient, No Pcp Per

## 2017-02-25 NOTE — Progress Notes (Signed)
       [place] [facility]  SUBJECTIVE: No chest pain . Cath site clean and dry   Vitals:   02/24/17 1315 02/24/17 2004 02/24/17 2359 02/25/17 0401  BP: (!) 150/85 (!) 155/78 130/68 125/71  Pulse: (!) 53 60 (!) 55 (!) 56  Resp: 18 16  18   Temp:  97.9 F (36.6 C)  97.8 F (36.6 C)  TempSrc:  Oral  Oral  SpO2: 96% 94% 93% 94%  Weight:      Height:        Intake/Output Summary (Last 24 hours) at 02/25/17 0704 Last data filed at 02/25/17 0401  Gross per 24 hour  Intake              300 ml  Output             2100 ml  Net            -1800 ml    LABS: Basic Metabolic Panel:  Recent Labs  02/24/17 0409 02/25/17 0412  NA 139 139  K 4.0 3.9  CL 107 108  CO2 25 25  GLUCOSE 114* 114*  BUN 17 13  CREATININE 0.87 0.91  CALCIUM 8.7* 8.8*   Liver Function Tests: No results for input(s): AST, ALT, ALKPHOS, BILITOT, PROT, ALBUMIN in the last 72 hours. No results for input(s): LIPASE, AMYLASE in the last 72 hours. CBC:  Recent Labs  02/24/17 0409 02/25/17 0412  WBC 5.9 6.6  HGB 14.9 14.7  HCT 44.4 43.6  MCV 85.2 84.6  PLT 206 200   Cardiac Enzymes: No results for input(s): CKTOTAL, CKMB, CKMBINDEX, TROPONINI in the last 72 hours. BNP: Invalid input(s): POCBNP D-Dimer: No results for input(s): DDIMER in the last 72 hours. Hemoglobin A1C: No results for input(s): HGBA1C in the last 72 hours. Fasting Lipid Panel: No results for input(s): CHOL, HDL, LDLCALC, TRIG, CHOLHDL, LDLDIRECT in the last 72 hours. Thyroid Function Tests: No results for input(s): TSH, T4TOTAL, T3FREE, THYROIDAB in the last 72 hours.  Invalid input(s): FREET3 Anemia Panel: No results for input(s): VITAMINB12, FOLATE, FERRITIN, TIBC, IRON, RETICCTPCT in the last 72 hours.   Physical Exam: Blood pressure 125/71, pulse (!) 56, temperature 97.8 F (36.6 C), temperature source Oral, resp. rate 18, height 5\' 11"  (1.803 m), weight 92.1 kg (203 lb), SpO2 94 %.    Wt Readings from Last 1 Encounters:  02/24/17 92.1 kg (203 lb)     General appearance: alert and cooperative Resp: clear to auscultation bilaterally Cardio: regular rate and rhythm Pulses: 2+ and symmetric Neurologic: Grossly normal  TELEMETRY: Reviewed telemetry pt in nsr:  ASSESSMENT AND PLAN:  Active Problems:   Chest pain due to CAD   NSTEMI (non-ST elevated myocardial infarction) (HCC)-stable post cath and pci of rca. Medical management of lad. OK for discharge to day on asa 81 mg daily, clopidogrel 75 mg daily, atorvastatin 40 mg daily, metoprolol 25 mg bid, nitrostat 0.4 mg prn. Phase 2 cardiac rehab recommended. No driving for 2 days. Follow up in 1 week.     [name], MD, [facility] 02/25/2017 7:04 AM

## 2017-02-25 NOTE — Care Management Important Message (Signed)
Important Message  Patient Details  Name: Deontaye Civello MRN: [id number] Date of Birth: [birth year]   Medicare Important Message Given:  No Informed during progression that patient was gon to discharge.  CM relayed that CM needed to see before discharged.  Thirty minutes later when CM attempted to see patient found his room empty   Katrina Stack, RN 02/25/2017, 11:22 AM

## 2017-02-25 NOTE — Care Management (Signed)
CM informed during progression that patient to discharge home today on new Eliquis. CM will need to provide 30 day coupon and confirm that patient does have pharmacy coverage with his medicare plan.  When CM went to see patient- his room was empty.  Primary nurse relayed patient has left.  Informed primary nurse that patient did not receive his 30 day coupon.  She will contact patient and arrange to have coupon picked up

## 2017-02-25 NOTE — Plan of Care (Signed)
Problem: Health Behavior/Discharge Planning: Goal: Ability to manage health-related needs will improve Outcome: Completed/Met Date Met: 02/25/17 Has understanding of what changes he needs to do to improve his health  Problem: Health Behavior/Discharge Planning: Goal: Ability to safely manage health-related needs after discharge will improve Outcome: Completed/Met Date Met: 02/25/17 Education regarding heart disease and cath care successful

## 2017-02-25 NOTE — Progress Notes (Signed)
Discharged with significant other.  Has prescriptions and referrals to cardiac rehab.  He has written information about each of his medications.  I recommended that he read only one each day so that he doesn't get overloaded.  Femoral site is clean and dry - surrounding tissue is soft.

## 2017-02-25 NOTE — Plan of Care (Signed)
Problem: Cardiovascular: Goal: Ability to achieve and maintain adequate cardiovascular perfusion will improve Outcome: Progressing VSS except HR 50s  With SB noted this shift.  Right groin without bleed or hematoma.

## 2019-09-06 ENCOUNTER — Encounter: Payer: Self-pay | Admitting: Emergency Medicine

## 2019-09-06 ENCOUNTER — Emergency Department: Payer: Medicare Other

## 2019-09-06 ENCOUNTER — Other Ambulatory Visit: Payer: Self-pay

## 2019-09-06 ENCOUNTER — Emergency Department
Admission: EM | Admit: 2019-09-06 | Discharge: 2019-09-06 | Disposition: A | Discharge status: Home / Self Care | Payer: Medicare Other | Attending: Emergency Medicine | Admitting: Emergency Medicine

## 2019-09-06 DIAGNOSIS — Y999 Unspecified external cause status: Secondary | ICD-10-CM | POA: Insufficient documentation

## 2019-09-06 DIAGNOSIS — Z955 Presence of coronary angioplasty implant and graft: Secondary | ICD-10-CM | POA: Insufficient documentation

## 2019-09-06 DIAGNOSIS — Y92814 Boat as the place of occurrence of the external cause: Secondary | ICD-10-CM | POA: Diagnosis not present

## 2019-09-06 DIAGNOSIS — Z7982 Long term (current) use of aspirin: Secondary | ICD-10-CM | POA: Insufficient documentation

## 2019-09-06 DIAGNOSIS — S301XXA Contusion of abdominal wall, initial encounter: Secondary | ICD-10-CM | POA: Diagnosis not present

## 2019-09-06 DIAGNOSIS — S3991XA Unspecified injury of abdomen, initial encounter: Secondary | ICD-10-CM | POA: Diagnosis present

## 2019-09-06 DIAGNOSIS — Y9319 Activity, other involving water and watercraft: Secondary | ICD-10-CM | POA: Insufficient documentation

## 2019-09-06 DIAGNOSIS — W19XXXA Unspecified fall, initial encounter: Secondary | ICD-10-CM

## 2019-09-06 DIAGNOSIS — W01198A Fall on same level from slipping, tripping and stumbling with subsequent striking against other object, initial encounter: Secondary | ICD-10-CM | POA: Diagnosis not present

## 2019-09-06 DIAGNOSIS — Z79899 Other long term (current) drug therapy: Secondary | ICD-10-CM | POA: Diagnosis not present

## 2019-09-06 DIAGNOSIS — S2241XA Multiple fractures of ribs, right side, initial encounter for closed fracture: Secondary | ICD-10-CM | POA: Diagnosis not present

## 2019-09-06 LAB — COMPREHENSIVE METABOLIC PANEL
ALT: 31 U/L (ref 0–44)
AST: 29 U/L (ref 15–41)
Albumin: 4.2 g/dL (ref 3.5–5.0)
Alkaline Phosphatase: 85 U/L (ref 38–126)
Anion gap: 12 (ref 5–15)
BUN: 21 mg/dL (ref 8–23)
CO2: 24 mmol/L (ref 22–32)
Calcium: 9.2 mg/dL (ref 8.9–10.3)
Chloride: 102 mmol/L (ref 98–111)
Creatinine, Ser: 1.08 mg/dL (ref 0.61–1.24)
GFR calc Af Amer: >60 mL/min (ref >60)
GFR calc non Af Amer: >60 mL/min (ref >60)
Glucose, Bld: 115 mg/dL — ABNORMAL HIGH (ref 70–99)
Potassium: 3.9 mmol/L (ref 3.5–5.1)
Sodium: 138 mmol/L (ref 135–145)
Total Bilirubin: 1.2 mg/dL (ref 0.3–1.2)
Total Protein: 7.9 g/dL (ref 6.5–8.1)

## 2019-09-06 LAB — URINALYSIS, COMPLETE (UACMP) WITH MICROSCOPIC
Bacteria, UA: NONE SEEN
Bilirubin Urine: NEGATIVE
Glucose, UA: NEGATIVE mg/dL
Ketones, ur: NEGATIVE mg/dL
Leukocytes,Ua: NEGATIVE
Nitrite: NEGATIVE
Protein, ur: NEGATIVE mg/dL
Specific Gravity, Urine: 1.02 (ref 1.005–1.030)
pH: 6 (ref 5.0–8.0)

## 2019-09-06 LAB — CBC WITH DIFFERENTIAL/PLATELET
Abs Immature Granulocytes: 0.04 10*3/uL (ref 0.00–0.07)
Basophils Absolute: 0.1 10*3/uL (ref 0.0–0.1)
Basophils Relative: 1 %
Eosinophils Absolute: 0.1 10*3/uL (ref 0.0–0.5)
Eosinophils Relative: 1 %
HCT: 43.2 % (ref 39.0–52.0)
Hemoglobin: 14.1 g/dL (ref 13.0–17.0)
Immature Granulocytes: 0 %
Lymphocytes Relative: 15 %
Lymphs Abs: 1.8 10*3/uL (ref 0.7–4.0)
MCH: 28.1 pg (ref 26.0–34.0)
MCHC: 32.6 g/dL (ref 30.0–36.0)
MCV: 86.2 fL (ref 80.0–100.0)
Monocytes Absolute: 1.1 10*3/uL — ABNORMAL HIGH (ref 0.1–1.0)
Monocytes Relative: 9 %
Neutro Abs: 8.8 10*3/uL — ABNORMAL HIGH (ref 1.7–7.7)
Neutrophils Relative %: 74 %
Platelets: 244 10*3/uL (ref 150–400)
RBC: 5.01 MIL/uL (ref 4.22–5.81)
RDW: 12.8 % (ref 11.5–15.5)
WBC: 11.9 10*3/uL — ABNORMAL HIGH (ref 4.0–10.5)
nRBC: 0 % (ref 0.0–0.2)

## 2019-09-06 LAB — LIPASE, BLOOD: Lipase: 25 U/L (ref 11–51)

## 2019-09-06 MED ORDER — HYDROCODONE-ACETAMINOPHEN 5-325 MG PO TABS: 1.0000 | ORAL_TABLET | Freq: Three times a day (TID) | ORAL | 0 refills | Status: AC | PRN

## 2019-09-06 MED ORDER — IOHEXOL 300 MG/ML  SOLN
100.0000 mL | Freq: Once | INTRAMUSCULAR | Status: AC | PRN
  Administered 2019-09-06: 100 mL via INTRAVENOUS

## 2019-09-06 MED ORDER — HYDROCODONE-ACETAMINOPHEN 5-325 MG PO TABS
1.0000 | ORAL_TABLET | Freq: Once | ORAL | Status: AC
  Administered 2019-09-06: 1 via ORAL
  Filled 2019-09-06: qty 1

## 2019-09-06 MED ORDER — CYCLOBENZAPRINE HCL 10 MG PO TABS
10.0000 mg | ORAL_TABLET | Freq: Once | ORAL | Status: AC
  Administered 2019-09-06: 10 mg via ORAL
  Filled 2019-09-06: qty 1

## 2019-09-06 MED ORDER — ONDANSETRON 4 MG PO TBDP
4.0000 mg | ORAL_TABLET | Freq: Once | ORAL | Status: AC
  Administered 2019-09-06: 4 mg via ORAL
  Filled 2019-09-06: qty 1

## 2019-09-06 MED ORDER — CYCLOBENZAPRINE HCL 5 MG PO TABS: 5.0000 mg | ORAL_TABLET | Freq: Three times a day (TID) | ORAL | 0 refills | Status: DC | PRN
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
## 2019-09-06 NOTE — Discharge Instructions (Addendum)
Your exam, labs, and CT scan show only 2 rib fractures and some bruising to the lateral abdominal wall muscles. You can expect to be sore and bruised for several days. You should return to the ED for any chest pain, shortness of breath, or abdominal pain. Take the prescription meds as directed. Apply ice and/or moist heat to reduce swelling and pain.
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
## 2020-02-05 ENCOUNTER — Other Ambulatory Visit: Payer: Self-pay

## 2020-02-05 ENCOUNTER — Emergency Department: Payer: Medicare Other

## 2020-02-05 ENCOUNTER — Encounter: Payer: Self-pay | Admitting: Emergency Medicine

## 2020-02-05 ENCOUNTER — Emergency Department
Admission: EM | Admit: 2020-02-05 | Discharge: 2020-02-05 | Disposition: A | Discharge status: Home / Self Care | Payer: Medicare Other | Attending: Emergency Medicine | Admitting: Emergency Medicine

## 2020-02-05 DIAGNOSIS — I2510 Atherosclerotic heart disease of native coronary artery without angina pectoris: Secondary | ICD-10-CM | POA: Insufficient documentation

## 2020-02-05 DIAGNOSIS — Z79899 Other long term (current) drug therapy: Secondary | ICD-10-CM | POA: Diagnosis not present

## 2020-02-05 DIAGNOSIS — R0789 Other chest pain: Secondary | ICD-10-CM | POA: Diagnosis present

## 2020-02-05 DIAGNOSIS — R079 Chest pain, unspecified: Secondary | ICD-10-CM

## 2020-02-05 DIAGNOSIS — Z7982 Long term (current) use of aspirin: Secondary | ICD-10-CM | POA: Insufficient documentation

## 2020-02-05 LAB — CBC
HCT: 44.9 % (ref 39.0–52.0)
Hemoglobin: 14.9 g/dL (ref 13.0–17.0)
MCH: 28.5 pg (ref 26.0–34.0)
MCHC: 33.2 g/dL (ref 30.0–36.0)
MCV: 85.9 fL (ref 80.0–100.0)
Platelets: 217 10*3/uL (ref 150–400)
RBC: 5.23 MIL/uL (ref 4.22–5.81)
RDW: 13.8 % (ref 11.5–15.5)
WBC: 5.5 10*3/uL (ref 4.0–10.5)
nRBC: 0 % (ref 0.0–0.2)

## 2020-02-05 LAB — TROPONIN I (HIGH SENSITIVITY)
Troponin I (High Sensitivity): 9 ng/L (ref <18)
Troponin I (High Sensitivity): 9 ng/L (ref <18)

## 2020-02-05 LAB — COMPREHENSIVE METABOLIC PANEL
ALT: 25 U/L (ref 0–44)
AST: 23 U/L (ref 15–41)
Albumin: 4 g/dL (ref 3.5–5.0)
Alkaline Phosphatase: 95 U/L (ref 38–126)
Anion gap: 10 (ref 5–15)
BUN: 10 mg/dL (ref 8–23)
CO2: 26 mmol/L (ref 22–32)
Calcium: 8.9 mg/dL (ref 8.9–10.3)
Chloride: 105 mmol/L (ref 98–111)
Creatinine, Ser: 0.85 mg/dL (ref 0.61–1.24)
GFR calc Af Amer: >60 mL/min (ref >60)
GFR calc non Af Amer: >60 mL/min (ref >60)
Glucose, Bld: 148 mg/dL — ABNORMAL HIGH (ref 70–99)
Potassium: 4.1 mmol/L (ref 3.5–5.1)
Sodium: 141 mmol/L (ref 135–145)
Total Bilirubin: 1.3 mg/dL — ABNORMAL HIGH (ref 0.3–1.2)
Total Protein: 7.3 g/dL (ref 6.5–8.1)

## 2020-02-05 MED ORDER — LIDOCAINE VISCOUS HCL 2 % MT SOLN
15.0000 mL | Freq: Once | OROMUCOSAL | Status: AC
  Administered 2020-02-05: 09:00:00 15 mL via ORAL
  Filled 2020-02-05: qty 15

## 2020-02-05 MED ORDER — ALUM & MAG HYDROXIDE-SIMETH 200-200-20 MG/5ML PO SUSP
30.0000 mL | Freq: Once | ORAL | Status: AC
  Administered 2020-02-05: 30 mL via ORAL
  Filled 2020-02-05: qty 30

## 2020-02-05 MED ORDER — FENTANYL CITRATE (PF) 100 MCG/2ML IJ SOLN
50.0000 ug | Freq: Once | INTRAMUSCULAR | Status: AC
  Administered 2020-02-05: 11:00:00 50 ug via INTRAVENOUS
  Filled 2020-02-05: qty 2

## 2020-02-05 MED ORDER — IOHEXOL 350 MG/ML SOLN
75.0000 mL | Freq: Once | INTRAVENOUS | Status: AC | PRN
  Administered 2020-02-05: 75 mL via INTRAVENOUS

## 2020-02-05 MED ORDER — HYDROCODONE-ACETAMINOPHEN 5-325 MG PO TABS: 1.0000 | ORAL_TABLET | ORAL | 0 refills | Status: DC | PRN

## 2020-02-05 NOTE — ED Notes (Signed)
Attempted IV access for additional IV x 2 without success

## 2020-02-05 NOTE — ED Triage Notes (Signed)
Pt arrived via POV with reports L chest pain that started 1 week ago, states worse over the past week, states last night he could not sleep states the pain is worse and states the pain radiates to right upper back. Pt states he will wake up in a sweat as well.  Pt reports the last few days he has been having some shortness of breath. Denies any dizziness or nausea.

## 2020-02-05 NOTE — ED Provider Notes (Signed)
[facility] Emergency Department Provider Note  Time seen: 8:49 AM  I have reviewed the triage vital signs and the nursing notes.   HISTORY  Chief Complaint Chest Pain   HPI Mark Wyatt is a 69 y.o. male with a past medical history of CAD, past stent in 2018, presents to the emergency department for chest pain.  According to the patient since last night he has been experiencing a mild left lower chest pain.  Denies any nausea or shortness of breath.  States the pain occasionally radiates to the back but most the time is in the left lower chest.  Somewhat intermittent.  No worse with deep inspiration or cough.  No leg pain or swelling.  No fever.   History reviewed. No pertinent past medical history.  Patient Active Problem List   Diagnosis Date Noted  . NSTEMI (non-ST elevated myocardial infarction) ([place]) 02/22/2017  . Chest pain due to CAD ([place]) 02/21/2017    Past Surgical History:  Procedure Laterality Date  . ANAL FISTULOTOMY    . CORONARY STENT INTERVENTION N/A 02/24/2017   Procedure: Coronary Stent Intervention;  Surgeon: [name], MD;  Location: [place] [facility];  Service: Cardiovascular;  Laterality: N/A;  . LEFT HEART CATH AND CORONARY ANGIOGRAPHY N/A 02/24/2017   Procedure: Left Heart Cath and Coronary Angiography;  Surgeon: [name], MD;  Location: [place] [facility];  Service: Cardiovascular;  Laterality: N/A;    Prior to Admission medications   Medication Sig Start Date End Date Taking? Authorizing Provider  aspirin 81 MG chewable tablet Chew 1 tablet (81 mg total) by mouth daily. 02/25/17   Bettey Costa, MD  atorvastatin (LIPITOR) 40 MG tablet Take 1 tablet (40 mg total) by mouth daily at 6 PM. 02/25/17   Bettey Costa, MD  cyclobenzaprine (FLEXERIL) 5 MG tablet Take 1 tablet (5 mg total) by mouth 3 (three) times daily as needed. 09/06/19   Menshew, Dannielle Karvonen, PA-C  nitroGLYCERIN (NITROSTAT) 0.4 MG SL tablet Place 1 tablet (0.4  mg total) under the tongue every 5 (five) minutes as needed for chest pain. 02/25/17   Bettey Costa, MD  tamsulosin (FLOMAX) 0.4 MG CAPS capsule Take 0.4 mg by mouth daily after supper.    [provider]  metoprolol tartrate (LOPRESSOR) 25 MG tablet Take 1 tablet (25 mg total) by mouth 2 (two) times daily. 02/25/17 09/06/19  Bettey Costa, MD    Allergies  Allergen Reactions  . Penicillins Other (See Comments)    Headache  Headache  . Aspirin Other (See Comments)    02/05/2020-pt has been taking baby aspirin before bed and tolerating well.    Family History  Problem Relation Age of Onset  . Diabetes Mother     Social History Social History   Tobacco Use  . Smoking status: Never Smoker  . Smokeless tobacco: Never Used  Substance Use Topics  . Alcohol use: Yes  . Drug use: No    Review of Systems Constitutional: Negative for fever. Cardiovascular: Mild left lower chest pain. Respiratory: Negative for shortness of breath. Gastrointestinal: Negative for abdominal pain Musculoskeletal: Negative for musculoskeletal complaints Neurological: Negative for headache All other ROS negative  ____________________________________________   PHYSICAL EXAM:  VITAL SIGNS: ED Triage Vitals  Enc Vitals Group     BP 02/05/20 0833 (!) 173/72     Pulse Rate 02/05/20 0833 72     Resp 02/05/20 0833 18     Temp 02/05/20 0833 97.8 F (  36.6 C)     Temp Source 02/05/20 0833 Oral     SpO2 02/05/20 0833 98 %     Weight 02/05/20 0831 208 lb (94.3 kg)     Height 02/05/20 0831 5\' 11"  (1.803 m)     Head Circumference --      Peak Flow --      Pain Score 02/05/20 0831 1     Pain Loc --      Pain Edu? --      Excl. in [place]? --    Constitutional: Alert and oriented. Well appearing and in no distress. Eyes: Normal exam ENT      Head: Normocephalic and atraumatic.      Mouth/Throat: Mucous membranes are moist. Cardiovascular: Normal rate, regular rhythm. No murmur Respiratory: Normal  respiratory effort without tachypnea nor retractions. Breath sounds are clear  Gastrointestinal: Soft and nontender. No distention.  Musculoskeletal: Nontender with normal range of motion in all extremities. No lower extremity tenderness or edema. Neurologic:  Normal speech and language. No gross focal neurologic deficits  Skin:  Skin is warm, dry and intact.  Psychiatric: Mood and affect are normal.  ____________________________________________    EKG  EKG viewed and interpreted by myself shows a sinus rhythm at 67 bpm with a narrow QRS, normal axis, largely normal intervals no concerning ST changes occasional PVC.  ____________________________________________    RADIOLOGY  Chest x-ray negative  ____________________________________________   INITIAL IMPRESSION / ASSESSMENT AND PLAN / ED COURSE  Pertinent labs & imaging results that were available during my care of the patient were reviewed by me and considered in my medical decision making (see chart for details).   Patient presents to the emergency department for left lower chest pain, somewhat intermittent.  We will check labs, chest x-ray and continue to close monitor.  EKG is reassuring.  Patient does have a history of reflux we will dose a GI cocktail.  Currently the patient appears well, no distress.  Patient's labs are overall reassuring.  X-ray is negative.  However patient states he now seems to hurt when he takes a deep breath.  We will obtain a CTA of the chest as a precaution and repeat a troponin.  Patient agreeable to plan of care.  We will dose a small dose of pain medication while awaiting results.  Repeat troponin is negative.  CTA is negative.  We will discharge the patient with a short course of pain medication.  Patient agreeable plan of care.  Discussed return precautions.   Mark Wyatt was evaluated in Emergency Department on 02/05/2020 for the symptoms described in the history of present illness. He was  evaluated in the context of the global COVID-19 pandemic, which necessitated consideration that the patient might be at risk for infection with the SARS-CoV-2 virus that causes COVID-19. Institutional protocols and algorithms that pertain to the evaluation of patients at risk for COVID-19 are in a state of rapid change based on information released by regulatory bodies including the CDC and federal and state organizations. These policies and algorithms were followed during the patient's care in the ED.  ____________________________________________   FINAL CLINICAL IMPRESSION(S) / ED DIAGNOSES  Chest pain   Harvest Dark, MD 02/05/20 1223

## 2020-02-05 NOTE — ED Notes (Signed)
First Nurse Note: Pt ambulatory into ED in NAD c/o chest pain.

## 2020-02-05 NOTE — ED Notes (Signed)
Pt reports GI cocktail did not relieve pain. Reports pain 2/10 in left chest which is consistent with pain that he had when he first came to the ER

## 2020-02-07 ENCOUNTER — Telehealth: Payer: Self-pay

## 2020-02-07 ENCOUNTER — Telehealth: Payer: Self-pay | Admitting: Emergency Medicine

## 2020-02-07 NOTE — Telephone Encounter (Addendum)
Called patient due to incidental finding on ct scan--thyroid nodule--with recommendation for ultrasound.  He says he has pcp, but they are in [place]. I told him that his pcp should be able to arrange ultrasound.  He wants me to talk to his wife who is retired [employer] scientist.  I gave him my number and she will call me when she gets home.  Wife called back and I explained need for ultrasound to evaluate thyroid nodule.

## 2020-02-07 NOTE — Telephone Encounter (Signed)
Confirmed appointment on 02/08/2020 and screened for [email or web address] 

## 2020-02-08 ENCOUNTER — Ambulatory Visit (INDEPENDENT_AMBULATORY_CARE_PROVIDER_SITE_OTHER): Payer: Medicare Other | Admitting: Adult Health

## 2020-02-08 ENCOUNTER — Encounter: Payer: Self-pay | Admitting: Adult Health

## 2020-02-08 ENCOUNTER — Other Ambulatory Visit: Payer: Self-pay

## 2020-02-08 VITALS — BP 150/89 | HR 62 | Temp 97.4°F | Resp 16 | Ht 71.0 in | Wt 216.0 lb

## 2020-02-08 DIAGNOSIS — Z7689 Persons encountering health services in other specified circumstances: Secondary | ICD-10-CM

## 2020-02-08 DIAGNOSIS — M791 Myalgia, unspecified site: Secondary | ICD-10-CM | POA: Diagnosis not present

## 2020-02-08 DIAGNOSIS — E041 Nontoxic single thyroid nodule: Secondary | ICD-10-CM

## 2020-02-08 DIAGNOSIS — E782 Mixed hyperlipidemia: Secondary | ICD-10-CM | POA: Diagnosis not present

## 2020-02-08 DIAGNOSIS — I700 Atherosclerosis of aorta: Secondary | ICD-10-CM

## 2020-02-08 DIAGNOSIS — J449 Chronic obstructive pulmonary disease, unspecified: Secondary | ICD-10-CM

## 2020-02-08 NOTE — Progress Notes (Signed)
[facility] [place], [place] [address]  Internal MEDICINE  Office Visit Note  Patient Name: Mark Wyatt  [id number]  [id number]  Date of Service: 03/02/2020   Complaints/HPI Pt is here for establishment of PCP. Chief Complaint  Patient presents with  . New Patient (Initial Visit)  . Hospitalization Follow-up  . Hyperlipidemia   HPI Pt is here to establish care. He has a history remarkable for GERD, CAD which he has a stent placed in 2018.  He is a well appearing 69yo Male.   Patient reports he went to the Emergency department on 02/05/20 for chest pain. The pain was in his left lower chest, and would radiate into his back.  His wife is a retired Marine scientist and they decided to have him checked in the ED.  His EKG and CXR were negative. Tronpoinins negative.  He was diagnosed        He had an episode of  Current Medication: Outpatient Encounter Medications as of 02/08/2020  Medication Sig  . aspirin 81 MG chewable tablet Chew 1 tablet (81 mg total) by mouth daily.  Marland Kitchen atorvastatin (LIPITOR) 40 MG tablet Take 1 tablet (40 mg total) by mouth daily at 6 PM.  . cyclobenzaprine (FLEXERIL) 5 MG tablet Take 1 tablet (5 mg total) by mouth 3 (three) times daily as needed.  . nitroGLYCERIN (NITROSTAT) 0.4 MG SL tablet Place 1 tablet (0.4 mg total) under the tongue every 5 (five) minutes as needed for chest pain.  . tamsulosin (FLOMAX) 0.4 MG CAPS capsule Take 0.4 mg by mouth daily after supper.  Marland Kitchen HYDROcodone-acetaminophen (NORCO/VICODIN) 5-325 MG tablet Take 1 tablet by mouth every 4 (four) hours as needed for moderate pain. (Patient not taking: Reported on 02/08/2020)  . [DISCONTINUED] metoprolol tartrate (LOPRESSOR) 25 MG tablet Take 1 tablet (25 mg total) by mouth 2 (two) times daily.   No facility-administered encounter medications on file as of 02/08/2020.    Surgical History: Past Surgical History:  Procedure Laterality Date  . ANAL FISTULOTOMY    . CORONARY STENT  INTERVENTION N/A 02/24/2017   Procedure: Coronary Stent Intervention;  Surgeon: [name], MD;  Location: [place] [facility];  Service: Cardiovascular;  Laterality: N/A;  . LEFT HEART CATH AND CORONARY ANGIOGRAPHY N/A 02/24/2017   Procedure: Left Heart Cath and Coronary Angiography;  Surgeon: [name], MD;  Location: [place] [facility];  Service: Cardiovascular;  Laterality: N/A;    Medical History: Past Medical History:  Diagnosis Date  . Hyperlipidemia     Family History: Family History  Problem Relation Age of Onset  . Diabetes Mother     Social History   Socioeconomic History  . Marital status: Widowed    Spouse name: Not on file  . Number of children: Not on file  . Years of education: Not on file  . Highest education level: Not on file  Occupational History  . Not on file  Tobacco Use  . Smoking status: Never Smoker  . Smokeless tobacco: Never Used  Substance and Sexual Activity  . Alcohol use: Yes  . Drug use: No  . Sexual activity: Not on file  Other Topics Concern  . Not on file  Social History Narrative  . Not on file   Social Determinants of Health   Financial Resource Strain:   . Difficulty of Paying Living Expenses:   Food Insecurity:   . Worried About Charity fundraiser in the Last Year:   .  Ran Out of Food in the Last Year:   Transportation Needs:   . Film/video editor (Medical):   Marland Kitchen Lack of Transportation (Non-Medical):   Physical Activity:   . Days of Exercise per Week:   . Minutes of Exercise per Session:   Stress:   . Feeling of Stress :   Social Connections:   . Frequency of Communication with Friends and Family:   . Frequency of Social Gatherings with Friends and Family:   . Attends Religious Services:   . Active Member of Clubs or Organizations:   . Attends Archivist Meetings:   Marland Kitchen Marital Status:   Intimate Partner Violence:   . Fear of Current or Ex-Partner:   . Emotionally Abused:   Marland Kitchen Physically  Abused:   . Sexually Abused:      Review of Systems  Constitutional: Negative.  Negative for chills, fatigue and unexpected weight change.  HENT: Negative.  Negative for congestion, rhinorrhea, sneezing and sore throat.   Eyes: Negative for redness.  Respiratory: Negative.  Negative for cough, chest tightness and shortness of breath.   Cardiovascular: Negative.  Negative for chest pain and palpitations.  Gastrointestinal: Negative.  Negative for abdominal pain, constipation, diarrhea, nausea and vomiting.  Endocrine: Negative.   Genitourinary: Negative.  Negative for dysuria and frequency.  Musculoskeletal: Negative.  Negative for arthralgias, back pain, joint swelling and neck pain.       Lower chest muscle cramping pain  Skin: Negative.  Negative for rash.  Allergic/Immunologic: Negative.   Neurological: Negative.  Negative for tremors and numbness.  Hematological: Negative for adenopathy. Does not bruise/bleed easily.  Psychiatric/Behavioral: Negative.  Negative for behavioral problems, sleep disturbance and suicidal ideas. The patient is not nervous/anxious.     Vital Signs: BP (!) 150/89   Pulse 62   Temp (!) 97.4 F (36.3 C)   Resp 16   Ht 5\' 11"  (1.803 m)   Wt 216 lb (98 kg)   SpO2 98%   BMI 30.13 kg/m    Physical Exam Vitals and nursing note reviewed.  Constitutional:      General: He is not in acute distress.    Appearance: He is well-developed. He is not diaphoretic.  HENT:     Head: Normocephalic and atraumatic.     Mouth/Throat:     Pharynx: No oropharyngeal exudate.  Eyes:     Pupils: Pupils are equal, round, and reactive to light.  Neck:     Thyroid: No thyromegaly.     Vascular: No JVD.     Trachea: No tracheal deviation.  Cardiovascular:     Rate and Rhythm: Normal rate and regular rhythm.     Heart sounds: Normal heart sounds. No murmur. No friction rub. No gallop.   Pulmonary:     Effort: Pulmonary effort is normal. No respiratory distress.      Breath sounds: Normal breath sounds. No wheezing or rales.  Chest:     Chest wall: No tenderness.  Abdominal:     Palpations: Abdomen is soft.     Tenderness: There is no abdominal tenderness. There is no guarding.  Musculoskeletal:        General: Normal range of motion.     Cervical back: Normal range of motion and neck supple.  Lymphadenopathy:     Cervical: No cervical adenopathy.  Skin:    General: Skin is warm and dry.  Neurological:     Mental Status: He is alert and oriented to person, place,  and time.     Cranial Nerves: No cranial nerve deficit.  Psychiatric:        Behavior: Behavior normal.        Thought Content: Thought content normal.        Judgment: Judgment normal.    Assessment/Plan: 1. Encounter to establish care with new doctor Pt is up to date on PHM  2. Muscle pain Some muscle pain in chest a possibility as cardiac issues were eliminated.   3. Chronic obstructive pulmonary disease, unspecified COPD type ([place]) Will evaluate PFT's and follow up with results available.  - Pulmonary Function Test; Future  4. Aortic atherosclerosis ([place]) On CT 4.17.21, continue Lipitor and lifestyle modifications.   5. Mixed hyperlipidemia Continue Lipitor and check lipid panel.   6. Thyroid nodule greater than or equal to 1.5 cm in diameter incidentally noted on imaging study Check thyroid levels. - TSH + free T4 - US THYROID; Future  General Counseling: Wassim verbalizes understanding of the findings of todays visit and agrees with plan of treatment. I have discussed any further diagnostic evaluation that may be needed or ordered today. We also reviewed his medications today. he has been encouraged to call the office with any questions or concerns that should arise related to todays visit.  Orders Placed This Encounter  Procedures  . US THYROID  . TSH + free T4  . Pulmonary Function Test    No orders of the defined types were placed in this  encounter.   Time spent: 30 Minutes   This patient was seen by Orson Gear AGNP-C in Collaboration with Dr Lavera Guise as a part of collaborative care agreement  Kendell Bane AGNP-C Internal Medicine

## 2020-02-15 LAB — TSH+FREE T4
Free T4: 1.11 ng/dL (ref 0.82–1.77)
TSH: 3 u[IU]/mL (ref 0.450–4.500)

## 2020-02-28 ENCOUNTER — Telehealth: Payer: Self-pay

## 2020-02-28 NOTE — Telephone Encounter (Signed)
Confirmed appointment on 03/01/2020 and screened for covid. klh °

## 2020-03-01 ENCOUNTER — Other Ambulatory Visit: Payer: Self-pay

## 2020-03-01 ENCOUNTER — Telehealth: Payer: Self-pay

## 2020-03-01 ENCOUNTER — Ambulatory Visit (INDEPENDENT_AMBULATORY_CARE_PROVIDER_SITE_OTHER): Payer: Medicare Other | Admitting: Internal Medicine

## 2020-03-01 DIAGNOSIS — R0602 Shortness of breath: Secondary | ICD-10-CM

## 2020-03-01 LAB — PULMONARY FUNCTION TEST

## 2020-03-01 NOTE — Telephone Encounter (Signed)
Confirmed appointment on 03/03/2020. klh

## 2020-03-03 ENCOUNTER — Ambulatory Visit (INDEPENDENT_AMBULATORY_CARE_PROVIDER_SITE_OTHER): Payer: Medicare Other

## 2020-03-03 DIAGNOSIS — E041 Nontoxic single thyroid nodule: Secondary | ICD-10-CM | POA: Diagnosis not present

## 2020-03-06 ENCOUNTER — Telehealth: Payer: Self-pay

## 2020-03-06 NOTE — Telephone Encounter (Signed)
Confirmed appointment on 03/08/2020. klh

## 2020-03-08 ENCOUNTER — Encounter: Payer: Self-pay | Admitting: Adult Health

## 2020-03-08 ENCOUNTER — Other Ambulatory Visit: Payer: Self-pay

## 2020-03-08 ENCOUNTER — Ambulatory Visit (INDEPENDENT_AMBULATORY_CARE_PROVIDER_SITE_OTHER): Payer: Medicare Other | Admitting: Adult Health

## 2020-03-08 VITALS — BP 191/84 | HR 61 | Temp 97.3°F | Ht 71.0 in | Wt 216.2 lb

## 2020-03-08 DIAGNOSIS — E041 Nontoxic single thyroid nodule: Secondary | ICD-10-CM | POA: Diagnosis not present

## 2020-03-08 DIAGNOSIS — M791 Myalgia, unspecified site: Secondary | ICD-10-CM

## 2020-03-08 DIAGNOSIS — J449 Chronic obstructive pulmonary disease, unspecified: Secondary | ICD-10-CM

## 2020-03-08 NOTE — Progress Notes (Signed)
[facility] [place], [place] [address]  Internal MEDICINE  Office Visit Note  Patient Name: Mark Wyatt  [id number]  [id number]  Date of Service: 03/08/2020  Chief Complaint  Patient presents with  . Follow-up    pft results    HPI  Pt is here for follow up on PFT and Thyroid US.  He continues to do well.  He denies any new or worsening symptoms.  His PFT is normal at this time. His thyroid US shows a 2.8cm nodule and it is recommended that he be evaluated for possible FNA.     Current Medication: Outpatient Encounter Medications as of 03/08/2020  Medication Sig  . aspirin 81 MG chewable tablet Chew 1 tablet (81 mg total) by mouth daily.  Marland Kitchen atorvastatin (LIPITOR) 40 MG tablet Take 1 tablet (40 mg total) by mouth daily at 6 PM.  . nitroGLYCERIN (NITROSTAT) 0.4 MG SL tablet Place 1 tablet (0.4 mg total) under the tongue every 5 (five) minutes as needed for chest pain.  . tamsulosin (FLOMAX) 0.4 MG CAPS capsule Take 0.4 mg by mouth daily after supper.  . [DISCONTINUED] cyclobenzaprine (FLEXERIL) 5 MG tablet Take 1 tablet (5 mg total) by mouth 3 (three) times daily as needed.  . [DISCONTINUED] HYDROcodone-acetaminophen (NORCO/VICODIN) 5-325 MG tablet Take 1 tablet by mouth every 4 (four) hours as needed for moderate pain. (Patient not taking: Reported on 02/08/2020)  . [DISCONTINUED] metoprolol tartrate (LOPRESSOR) 25 MG tablet Take 1 tablet (25 mg total) by mouth 2 (two) times daily.   No facility-administered encounter medications on file as of 03/08/2020.    Surgical History: Past Surgical History:  Procedure Laterality Date  . ANAL FISTULOTOMY    . CORONARY STENT INTERVENTION N/A 02/24/2017   Procedure: Coronary Stent Intervention;  Surgeon: [name], MD;  [facility];  Service: Cardiovascular;  Laterality: N/A;  . LEFT HEART CATH AND CORONARY ANGIOGRAPHY N/A 02/24/2017   Procedure: Left Heart Cath and Coronary Angiography;   Surgeon: [name], MD;  Location: [place] [facility];  Service: Cardiovascular;  Laterality: N/A;    Medical History: Past Medical History:  Diagnosis Date  . Hyperlipidemia     Family History: Family History  Problem Relation Age of Onset  . Diabetes Mother     Social History   Socioeconomic History  . Marital status: Widowed    Spouse name: Not on file  . Number of children: Not on file  . Years of education: Not on file  . Highest education level: Not on file  Occupational History  . Not on file  Tobacco Use  . Smoking status: Former Research scientist (life sciences)  . Smokeless tobacco: Never Used  Substance and Sexual Activity  . Alcohol use: Yes  . Drug use: No  . Sexual activity: Not on file  Other Topics Concern  . Not on file  Social History Narrative  . Not on file   Social Determinants of Health   Financial Resource Strain:   . Difficulty of Paying Living Expenses:   Food Insecurity:   . Worried About Charity fundraiser in the Last Year:   . Arboriculturist in the Last Year:   Transportation Needs:   . Film/video editor (Medical):   Marland Kitchen Lack of Transportation (Non-Medical):   Physical Activity:   . Days of Exercise per Week:   . Minutes of Exercise per Session:   Stress:   . Feeling of Stress :  Social Connections:   . Frequency of Communication with Friends and Family:   . Frequency of Social Gatherings with Friends and Family:   . Attends Religious Services:   . Active Member of Clubs or Organizations:   . Attends Archivist Meetings:   Marland Kitchen Marital Status:   Intimate Partner Violence:   . Fear of Current or Ex-Partner:   . Emotionally Abused:   Marland Kitchen Physically Abused:   . Sexually Abused:       Review of Systems  Constitutional: Negative.  Negative for chills, fatigue and unexpected weight change.  HENT: Negative.  Negative for congestion, rhinorrhea, sneezing and sore throat.   Eyes: Negative for redness.  Respiratory: Negative.  Negative  for cough, chest tightness and shortness of breath.   Cardiovascular: Negative.  Negative for chest pain and palpitations.  Gastrointestinal: Negative.  Negative for abdominal pain, constipation, diarrhea, nausea and vomiting.  Endocrine: Negative.   Genitourinary: Negative.  Negative for dysuria and frequency.  Musculoskeletal: Negative.  Negative for arthralgias, back pain, joint swelling and neck pain.  Skin: Negative.  Negative for rash.  Allergic/Immunologic: Negative.   Neurological: Negative.  Negative for tremors and numbness.  Hematological: Negative for adenopathy. Does not bruise/bleed easily.  Psychiatric/Behavioral: Negative.  Negative for behavioral problems, sleep disturbance and suicidal ideas. The patient is not nervous/anxious.     Vital Signs: BP (!) 191/84   Pulse 61   Temp (!) 97.3 F (36.3 C)   Ht 5\' 11"  (1.803 m)   Wt 216 lb 3.2 oz (98.1 kg)   SpO2 98%   BMI 30.15 kg/m    Physical Exam Vitals and nursing note reviewed.  Constitutional:      General: He is not in acute distress.    Appearance: He is well-developed. He is not diaphoretic.  HENT:     Head: Normocephalic and atraumatic.     Mouth/Throat:     Pharynx: No oropharyngeal exudate.  Eyes:     Pupils: Pupils are equal, round, and reactive to light.  Neck:     Thyroid: No thyromegaly.     Vascular: No JVD.     Trachea: No tracheal deviation.  Cardiovascular:     Rate and Rhythm: Normal rate and regular rhythm.     Heart sounds: Normal heart sounds. No murmur. No friction rub. No gallop.   Pulmonary:     Effort: Pulmonary effort is normal. No respiratory distress.     Breath sounds: Normal breath sounds. No wheezing or rales.  Chest:     Chest wall: No tenderness.  Abdominal:     Palpations: Abdomen is soft.     Tenderness: There is no abdominal tenderness. There is no guarding.  Musculoskeletal:        General: Normal range of motion.     Cervical back: Normal range of motion and neck  supple.  Lymphadenopathy:     Cervical: No cervical adenopathy.  Skin:    General: Skin is warm and dry.  Neurological:     Mental Status: He is alert and oriented to person, place, and time.     Cranial Nerves: No cranial nerve deficit.  Psychiatric:        Behavior: Behavior normal.        Thought Content: Thought content normal.        Judgment: Judgment normal.    Assessment/Plan: 1. Thyroid nodule greater than or equal to 1.5 cm in diameter incidentally noted on imaging study Referrall to be  evaluated by ENT for 2.8cm Nodule of right thyroid pole. - Ambulatory referral to ENT  2. Chronic obstructive pulmonary disease, unspecified COPD type ([place]) PFT is normal, continue to follow.  No current symptoms.   3. Muscle pain Intermittent.  Continue to monitor.   General Counseling: Khasir verbalizes understanding of the findings of todays visit and agrees with plan of treatment. I have discussed any further diagnostic evaluation that may be needed or ordered today. We also reviewed his medications today. he has been encouraged to call the office with any questions or concerns that should arise related to todays visit.    Orders Placed This Encounter  Procedures  . Ambulatory referral to ENT    No orders of the defined types were placed in this encounter.   Time spent: 30 Minutes   This patient was seen by Orson Gear AGNP-C in Collaboration with Dr Lavera Guise as a part of collaborative care agreement     Kendell Bane AGNP-C Internal medicine

## 2020-03-09 NOTE — Procedures (Signed)
Mark Wyatt, [id number]  DATE OF SERVICE: Mar 01, 2020  Complete Pulmonary Function Testing Interpretation:  FINDINGS:  The forced vital capacity is normal FEV1 is 2.63 L which is 79% of predicted and is mildly decreased.  Postbronchodilator there is significant improvement in the FEV1.  FEV1 FVC ratio is mildly decreased.  Total lung capacity is normal residual volume is normal residual volume total lung capacity ratio is increased.  The FRC is normal.  DLCO is within normal limits  IMPRESSION:  This pulmonary function study is consistent with mild obstructive lung disease.  Patient has a normal DLCO clinical correlation recommended  Allyne Gee, MD [facility] Pulmonary Critical Care Medicine Sleep Medicine

## 2020-03-24 ENCOUNTER — Ambulatory Visit
Admission: RE | Admit: 2020-03-24 | Discharge: 2020-03-24 | Disposition: A | Discharge status: Home / Self Care | Payer: Self-pay | Source: Ambulatory Visit | Attending: Unknown Physician Specialty | Admitting: Unknown Physician Specialty

## 2020-03-24 ENCOUNTER — Other Ambulatory Visit: Payer: Self-pay | Admitting: Unknown Physician Specialty

## 2020-03-24 DIAGNOSIS — E041 Nontoxic single thyroid nodule: Secondary | ICD-10-CM

## 2020-03-27 ENCOUNTER — Other Ambulatory Visit: Payer: Self-pay | Admitting: Unknown Physician Specialty

## 2020-03-27 DIAGNOSIS — E041 Nontoxic single thyroid nodule: Secondary | ICD-10-CM

## 2020-04-03 ENCOUNTER — Other Ambulatory Visit: Payer: Self-pay

## 2020-04-03 ENCOUNTER — Ambulatory Visit
Admission: RE | Admit: 2020-04-03 | Discharge: 2020-04-03 | Disposition: A | Discharge status: Home / Self Care | Payer: Medicare Other | Source: Ambulatory Visit | Attending: Unknown Physician Specialty | Admitting: Unknown Physician Specialty

## 2020-04-03 DIAGNOSIS — E041 Nontoxic single thyroid nodule: Secondary | ICD-10-CM | POA: Diagnosis not present

## 2020-04-03 NOTE — Discharge Instructions (Signed)
Thyroid Needle Biopsy, Care After This sheet gives you information about how to care for yourself after your procedure. Your health care provider may also give you more specific instructions. If you have problems or questions, contact your health care provider. What can I expect after the procedure? After the procedure, it is common to have:  Soreness and tenderness that lasts for a few days.  Bruising where the needle was inserted (puncture site). Follow these instructions at home:   Take over-the-counter and prescription medicines only as told by your health care provider.  To help ease discomfort, keep your head raised (elevated) when you are lying down. When you move from lying down to sitting up, use both hands to support the back of your head and neck.  Check your puncture site every day for signs of infection. Check for: ? Redness, swelling, or pain. ? Fluid or blood. ? Warmth. ? Pus or a bad smell.  Return to your normal activities as told by your health care provider. Ask your health care provider what activities are safe for you.  Keep all follow-up visits as told by your health care provider. This is important. Contact a health care provider if:  You have redness, swelling, or pain around your puncture site.  You have fluid or blood coming from your puncture site.  Your puncture site feels warm to the touch.  You have pus or a bad smell coming from your puncture site.  You have a fever. Get help right away if:  You have severe bleeding from the puncture site.  You have difficulty swallowing.  You have swollen glands (lymph nodes) in your neck. Summary  It is common to have some bruising and soreness where the needle was inserted in your lower front neck area (puncture site).  Check your puncture site every day for signs of infection, such as redness, swelling, or pain.  Get help right away if you have severe bleeding from your puncture site. This  information is not intended to replace advice given to you by your health care provider. Make sure you discuss any questions you have with your health care provider. Document Revised: 09/19/2017 Document Reviewed: 07/21/2017 Elsevier Patient Education  2020 [employer]  

## 2020-04-03 NOTE — Procedures (Signed)
Pre Procedure Dx: Indeterminate right sided thyroid nodule Post Procedural Dx: Same  Technically successful US guided biopsy of indeterminate nodule within the inferior pole of the right lobe of the thyroid.   EBL: None  No immediate complications.   Ronny Bacon, MD Pager #: [phone]

## 2020-04-05 LAB — CYTOLOGY - NON PAP

## 2020-04-06 ENCOUNTER — Other Ambulatory Visit (HOSPITAL_COMMUNITY): Payer: Self-pay | Admitting: Unknown Physician Specialty

## 2020-04-06 ENCOUNTER — Other Ambulatory Visit: Payer: Self-pay | Admitting: Unknown Physician Specialty

## 2020-04-06 DIAGNOSIS — E041 Nontoxic single thyroid nodule: Secondary | ICD-10-CM

## 2020-07-28 ENCOUNTER — Ambulatory Visit (INDEPENDENT_AMBULATORY_CARE_PROVIDER_SITE_OTHER): Payer: Medicare Other | Admitting: Adult Health

## 2020-07-28 ENCOUNTER — Encounter: Payer: Self-pay | Admitting: Adult Health

## 2020-07-28 VITALS — BP 150/78 | HR 62 | Temp 97.5°F | Resp 16 | Ht 71.0 in | Wt 216.0 lb

## 2020-07-28 DIAGNOSIS — L989 Disorder of the skin and subcutaneous tissue, unspecified: Secondary | ICD-10-CM

## 2020-07-28 DIAGNOSIS — N401 Enlarged prostate with lower urinary tract symptoms: Secondary | ICD-10-CM

## 2020-07-28 DIAGNOSIS — N528 Other male erectile dysfunction: Secondary | ICD-10-CM

## 2020-07-28 DIAGNOSIS — E782 Mixed hyperlipidemia: Secondary | ICD-10-CM

## 2020-07-28 DIAGNOSIS — Z0001 Encounter for general adult medical examination with abnormal findings: Secondary | ICD-10-CM

## 2020-07-28 DIAGNOSIS — R300 Dysuria: Secondary | ICD-10-CM

## 2020-07-28 DIAGNOSIS — Z79899 Other long term (current) drug therapy: Secondary | ICD-10-CM

## 2020-07-28 DIAGNOSIS — J449 Chronic obstructive pulmonary disease, unspecified: Secondary | ICD-10-CM | POA: Diagnosis not present

## 2020-07-28 DIAGNOSIS — R972 Elevated prostate specific antigen [PSA]: Secondary | ICD-10-CM

## 2020-07-28 DIAGNOSIS — R351 Nocturia: Secondary | ICD-10-CM

## 2020-07-28 MED ORDER — ATORVASTATIN CALCIUM 40 MG PO TABS: 40.0000 mg | ORAL_TABLET | Freq: Every day | ORAL | 1 refills | Status: DC

## 2020-07-28 MED ORDER — TAMSULOSIN HCL 0.4 MG PO CAPS: 0.8000 mg | ORAL_CAPSULE | Freq: Every day | ORAL | 1 refills | Status: DC

## 2020-07-28 MED ORDER — SILDENAFIL CITRATE 100 MG PO TABS: 100.0000 mg | ORAL_TABLET | Freq: Every day | ORAL | 0 refills | Status: DC | PRN

## 2020-07-28 NOTE — Progress Notes (Signed)
[facility] [place], [place] [address]  Internal MEDICINE  Office Visit Note  Patient Name: Mark Wyatt  [id number]  [id number]  Date of Service: 07/28/2020  Chief Complaint  Patient presents with  . Medicare Wellness    frequency, urgency, tamsolusion not helping, viagra, spot on arm  . Hyperlipidemia  . Quality Metric Gaps    Hep C, TDAP     HPI Pt is here for routine health maintenance examination.  He is a well appearing 69 yo male.  He has a history of copd, HLD, and bph.  He has been taking flomax 0.4 mg nightly.  He continues to report frequent nocturnal urination. His bp is slightly elevated today. He is also complaining of two small spots on his right arm.  They have been present for a few years.  He frequently scratches them and causes them to bleed.   Current Medication: Outpatient Encounter Medications as of 07/28/2020  Medication Sig  . aspirin 81 MG chewable tablet Chew 1 tablet (81 mg total) by mouth daily.  Marland Kitchen atorvastatin (LIPITOR) 40 MG tablet Take 1 tablet (40 mg total) by mouth daily at 6 PM.  . nitroGLYCERIN (NITROSTAT) 0.4 MG SL tablet Place 1 tablet (0.4 mg total) under the tongue every 5 (five) minutes as needed for chest pain.  . tamsulosin (FLOMAX) 0.4 MG CAPS capsule Take 0.4 mg by mouth daily after supper.  . [DISCONTINUED] metoprolol tartrate (LOPRESSOR) 25 MG tablet Take 1 tablet (25 mg total) by mouth 2 (two) times daily.   No facility-administered encounter medications on file as of 07/28/2020.    Surgical History: Past Surgical History:  Procedure Laterality Date  . ANAL FISTULOTOMY    . CORONARY STENT INTERVENTION N/A 02/24/2017   Procedure: Coronary Stent Intervention;  Surgeon: [name], MD;  Location: [facility];  Service: Cardiovascular;  Laterality: N/A;  . LEFT HEART CATH AND CORONARY ANGIOGRAPHY N/A 02/24/2017   Procedure: Left Heart Cath and Coronary Angiography;  Surgeon: [name], MD;   Location: [facility];  Service: Cardiovascular;  Laterality: N/A;    Medical History: Past Medical History:  Diagnosis Date  . Hyperlipidemia     Family History: Family History  Problem Relation Age of Onset  . Diabetes Mother       Review of Systems  Constitutional: Negative.  Negative for chills, fatigue and unexpected weight change.  HENT: Negative.  Negative for congestion, rhinorrhea, sneezing and sore throat.   Eyes: Negative for redness.  Respiratory: Negative.  Negative for cough, chest tightness and shortness of breath.   Cardiovascular: Negative.  Negative for chest pain and palpitations.  Gastrointestinal: Negative.  Negative for abdominal pain, constipation, diarrhea, nausea and vomiting.  Endocrine: Negative.   Genitourinary: Negative.  Negative for dysuria and frequency.  Musculoskeletal: Negative.  Negative for arthralgias, back pain, joint swelling and neck pain.  Skin: Negative.  Negative for rash.  Allergic/Immunologic: Negative.   Neurological: Negative.  Negative for tremors and numbness.  Hematological: Negative for adenopathy. Does not bruise/bleed easily.  Psychiatric/Behavioral: Negative.  Negative for behavioral problems, sleep disturbance and suicidal ideas. The patient is not nervous/anxious.      Vital Signs: BP (!) 150/78   Pulse 62   Temp (!) 97.5 F (36.4 C)   Resp 16   Ht 5\' 11"  (1.803 m)   Wt 216 lb (98 kg)   SpO2 98%   BMI 30.13 kg/m    Physical Exam Vitals  and nursing note reviewed.  Constitutional:      General: He is not in acute distress.    Appearance: He is well-developed. He is not diaphoretic.  HENT:     Head: Normocephalic and atraumatic.     Mouth/Throat:     Pharynx: No oropharyngeal exudate.  Eyes:     Pupils: Pupils are equal, round, and reactive to light.  Neck:     Thyroid: No thyromegaly.     Vascular: No JVD.     Trachea: No tracheal deviation.  Cardiovascular:     Rate and Rhythm: Normal rate  and regular rhythm.     Heart sounds: Normal heart sounds. No murmur heard.  No friction rub. No gallop.   Pulmonary:     Effort: Pulmonary effort is normal. No respiratory distress.     Breath sounds: Normal breath sounds. No wheezing or rales.  Chest:     Chest wall: No tenderness.  Abdominal:     Palpations: Abdomen is soft.     Tenderness: There is no abdominal tenderness. There is no guarding.  Musculoskeletal:        General: Normal range of motion.     Cervical back: Normal range of motion and neck supple.  Lymphadenopathy:     Cervical: No cervical adenopathy.  Skin:    General: Skin is warm and dry.  Neurological:     Mental Status: He is alert and oriented to person, place, and time.     Cranial Nerves: No cranial nerve deficit.  Psychiatric:        Behavior: Behavior normal.        Thought Content: Thought content normal.        Judgment: Judgment normal.      LABS: No results found for this or any previous visit (from the past 2160 hour(s)).    Assessment/Plan: 1. Encounter for general adult medical examination with abnormal findings - CBC with Differential/Platelet - Lipid Panel With LDL/HDL Ratio - TSH - T4, free - Comprehensive metabolic panel - PSA  2. Chronic obstructive pulmonary disease, unspecified COPD type ([place]) Stable, continue present management with inhalers.   3. Mixed hyperlipidemia Recheck lipid panel, and continues lipitor as prescribed.   4. Skin lesion of right arm Referral for Derm - Ambulatory referral to Dermatology  5. Other male erectile dysfunction Refilled sildenafil, and discussed safety.  - sildenafil (VIAGRA) 100 MG tablet; Take 1 tablet (100 mg total) by mouth daily as needed for erectile dysfunction.  Dispense: 10 tablet; Refill: 0  6. Benign prostatic hyperplasia with nocturia Increase dose of Flomax to 0.8mg  daily.  - tamsulosin (FLOMAX) 0.4 MG CAPS capsule; Take 2 capsules (0.8 mg total) by mouth daily after  supper.  Dispense: 180 capsule; Refill: 1  7. Dysuria - UA/M w/rflx Culture, Routine  8. Encounter for long-term (current) use of high-risk medication - CBC with Differential/Platelet - Lipid Panel With LDL/HDL Ratio - TSH - T4, free - Comprehensive metabolic panel  9. Elevated prostate specific antigen (PSA)  - PSA  General Counseling: Courtney verbalizes understanding of the findings of todays visit and agrees with plan of treatment. I have discussed any further diagnostic evaluation that may be needed or ordered today. We also reviewed his medications today. he has been encouraged to call the office with any questions or concerns that should arise related to todays visit.   Orders Placed This Encounter  Procedures  . UA/M w/rflx Culture, Routine    No orders of the  defined types were placed in this encounter.   Time spent: 30 Minutes   This patient was seen by Orson Gear AGNP-C in Collaboration with Dr Lavera Guise as a part of collaborative care agreement    Kendell Bane AGNP-C Internal Medicine

## 2020-07-29 LAB — UA/M W/RFLX CULTURE, ROUTINE
Bilirubin, UA: NEGATIVE
Glucose, UA: NEGATIVE
Ketones, UA: NEGATIVE
Leukocytes,UA: NEGATIVE
Nitrite, UA: NEGATIVE
Protein,UA: NEGATIVE
RBC, UA: NEGATIVE
Specific Gravity, UA: 1.014 (ref 1.005–1.030)
Urobilinogen, Ur: 0.2 mg/dL (ref 0.2–1.0)
pH, UA: 6 (ref 5.0–7.5)

## 2020-07-29 LAB — MICROSCOPIC EXAMINATION
Bacteria, UA: NONE SEEN
Casts: NONE SEEN /lpf
Epithelial Cells (non renal): NONE SEEN /hpf (ref 0–10)
WBC, UA: NONE SEEN /hpf (ref 0–5)

## 2020-08-02 LAB — COMPREHENSIVE METABOLIC PANEL
ALT: 27 IU/L (ref 0–44)
AST: 25 IU/L (ref 0–40)
Albumin/Globulin Ratio: 1.3 (ref 1.2–2.2)
Albumin: 4.2 g/dL (ref 3.8–4.8)
Alkaline Phosphatase: 132 IU/L — ABNORMAL HIGH (ref 44–121)
BUN/Creatinine Ratio: 11 (ref 10–24)
BUN: 11 mg/dL (ref 8–27)
Bilirubin Total: 0.6 mg/dL (ref 0.0–1.2)
CO2: 25 mmol/L (ref 20–29)
Calcium: 9.6 mg/dL (ref 8.6–10.2)
Chloride: 102 mmol/L (ref 96–106)
Creatinine, Ser: 1 mg/dL (ref 0.76–1.27)
GFR calc Af Amer: 88 mL/min/{1.73_m2} (ref >59)
GFR calc non Af Amer: 76 mL/min/{1.73_m2} (ref >59)
Globulin, Total: 3.3 g/dL (ref 1.5–4.5)
Glucose: 139 mg/dL — ABNORMAL HIGH (ref 65–99)
Potassium: 4.5 mmol/L (ref 3.5–5.2)
Sodium: 141 mmol/L (ref 134–144)
Total Protein: 7.5 g/dL (ref 6.0–8.5)

## 2020-08-02 LAB — CBC WITH DIFFERENTIAL/PLATELET
Basophils Absolute: 0 10*3/uL (ref 0.0–0.2)
Basos: 1 %
EOS (ABSOLUTE): 0.2 10*3/uL (ref 0.0–0.4)
Eos: 3 %
Hematocrit: 46.6 % (ref 37.5–51.0)
Hemoglobin: 15.4 g/dL (ref 13.0–17.7)
Immature Grans (Abs): 0 10*3/uL (ref 0.0–0.1)
Immature Granulocytes: 0 %
Lymphocytes Absolute: 1.6 10*3/uL (ref 0.7–3.1)
Lymphs: 26 %
MCH: 28.5 pg (ref 26.6–33.0)
MCHC: 33 g/dL (ref 31.5–35.7)
MCV: 86 fL (ref 79–97)
Monocytes Absolute: 0.7 10*3/uL (ref 0.1–0.9)
Monocytes: 11 %
Neutrophils Absolute: 3.7 10*3/uL (ref 1.4–7.0)
Neutrophils: 59 %
Platelets: 250 10*3/uL (ref 150–450)
RBC: 5.4 x10E6/uL (ref 4.14–5.80)
RDW: 12.5 % (ref 11.6–15.4)
WBC: 6.2 10*3/uL (ref 3.4–10.8)

## 2020-08-02 LAB — LIPID PANEL WITH LDL/HDL RATIO
Cholesterol, Total: 103 mg/dL (ref 100–199)
HDL: 36 mg/dL — ABNORMAL LOW (ref >39)
LDL Chol Calc (NIH): 51 mg/dL (ref 0–99)
LDL/HDL Ratio: 1.4 ratio (ref 0.0–3.6)
Triglycerides: 78 mg/dL (ref 0–149)
VLDL Cholesterol Cal: 16 mg/dL (ref 5–40)

## 2020-08-02 LAB — TSH: TSH: 2.25 u[IU]/mL (ref 0.450–4.500)

## 2020-08-02 LAB — T4, FREE: Free T4: 1.12 ng/dL (ref 0.82–1.77)

## 2020-08-02 LAB — PSA: Prostate Specific Ag, Serum: 1.8 ng/mL (ref 0.0–4.0)

## 2020-09-04 ENCOUNTER — Telehealth: Payer: Self-pay

## 2020-09-04 ENCOUNTER — Other Ambulatory Visit: Payer: Self-pay

## 2020-09-04 ENCOUNTER — Encounter: Payer: Self-pay | Admitting: Hospice and Palliative Medicine

## 2020-09-04 ENCOUNTER — Ambulatory Visit (INDEPENDENT_AMBULATORY_CARE_PROVIDER_SITE_OTHER): Payer: Medicare Other | Admitting: Internal Medicine

## 2020-09-04 DIAGNOSIS — I6523 Occlusion and stenosis of bilateral carotid arteries: Secondary | ICD-10-CM

## 2020-09-04 DIAGNOSIS — I10 Essential (primary) hypertension: Secondary | ICD-10-CM

## 2020-09-04 DIAGNOSIS — R350 Frequency of micturition: Secondary | ICD-10-CM

## 2020-09-04 DIAGNOSIS — R7301 Impaired fasting glucose: Secondary | ICD-10-CM

## 2020-09-04 DIAGNOSIS — N401 Enlarged prostate with lower urinary tract symptoms: Secondary | ICD-10-CM | POA: Diagnosis not present

## 2020-09-04 DIAGNOSIS — I2510 Atherosclerotic heart disease of native coronary artery without angina pectoris: Secondary | ICD-10-CM

## 2020-09-04 DIAGNOSIS — Z9862 Peripheral vascular angioplasty status: Secondary | ICD-10-CM

## 2020-09-04 DIAGNOSIS — Z23 Encounter for immunization: Secondary | ICD-10-CM | POA: Diagnosis not present

## 2020-09-04 DIAGNOSIS — I2583 Coronary atherosclerosis due to lipid rich plaque: Secondary | ICD-10-CM

## 2020-09-04 LAB — POCT GLYCOSYLATED HEMOGLOBIN (HGB A1C): Hemoglobin A1C: 6.2 % — AB (ref 4.0–5.6)

## 2020-09-04 LAB — POCT CBG (FASTING - GLUCOSE)-MANUAL ENTRY: Glucose Fasting, POC: 167 mg/dL — AB (ref 70–99)

## 2020-09-04 MED ORDER — AMLODIPINE BESYLATE 2.5 MG PO TABS: 2.5000 mg | ORAL_TABLET | Freq: Every day | ORAL | 3 refills | Status: DC

## 2020-09-04 MED ORDER — ACCU-CHEK GUIDE VI STRP
ORAL_STRIP | 1 refills | Status: DC
Start: 2020-09-04 — End: 2020-09-08

## 2020-09-04 MED ORDER — ACCU-CHEK SOFTCLIX LANCETS MISC
1 refills | Status: DC
Start: 2020-09-04 — End: 2020-09-08

## 2020-09-04 NOTE — Progress Notes (Signed)
[facility] [place], [place] [address]  Internal MEDICINE  Office Visit Note  Patient Name: Mark Wyatt  [id number]  [id number]  Date of Service: 09/04/2020  Chief Complaint  Patient presents with  . Follow-up    nose bleeds, urine frequency at night  . Hyperlipidemia  . policy update form    received    HPI  Pt is here with multiple complaints 1. Elevated blood sugar, no hx of diabetes, numbers are between 140-180. don't have a glucometer  2. Hx of CAD s/p RCA angioplasty, has not been seen by cardiology due to covid. Is not any Plavix.Lately he has been more SOB 3. Has increased urination during the day and at night, Flomax is not helping ( ?? Elevated glucose), has not seen by urology  4. Blood pressure is elevated as well 5. Colonoscopy in 2020  Current Medication: Outpatient Encounter Medications as of 09/04/2020  Medication Sig  . amLODipine (NORVASC) 2.5 MG tablet Take 1 tablet (2.5 mg total) by mouth daily.  Marland Kitchen aspirin 81 MG chewable tablet Chew 1 tablet (81 mg total) by mouth daily.  Marland Kitchen atorvastatin (LIPITOR) 40 MG tablet Take 1 tablet (40 mg total) by mouth daily at 6 PM.  . nitroGLYCERIN (NITROSTAT) 0.4 MG SL tablet Place 1 tablet (0.4 mg total) under the tongue every 5 (five) minutes as needed for chest pain.  . sildenafil (VIAGRA) 100 MG tablet Take 1 tablet (100 mg total) by mouth daily as needed for erectile dysfunction.  . tamsulosin (FLOMAX) 0.4 MG CAPS capsule Take 2 capsules (0.8 mg total) by mouth daily after supper.  . [DISCONTINUED] metoprolol tartrate (LOPRESSOR) 25 MG tablet Take 1 tablet (25 mg total) by mouth 2 (two) times daily.   No facility-administered encounter medications on file as of 09/04/2020.    Surgical History: Past Surgical History:  Procedure Laterality Date  . ANAL FISTULOTOMY    . CORONARY STENT INTERVENTION N/A 02/24/2017   Procedure: Coronary Stent Intervention;  Surgeon: [name], MD;   Location: [facility];  Service: Cardiovascular;  Laterality: N/A;  . LEFT HEART CATH AND CORONARY ANGIOGRAPHY N/A 02/24/2017   Procedure: Left Heart Cath and Coronary Angiography;  Surgeon: [name], MD;  Location: [facility];  Service: Cardiovascular;  Laterality: N/A;    Medical History: Past Medical History:  Diagnosis Date  . Hyperlipidemia     Family History: Family History  Problem Relation Age of Onset  . Diabetes Mother     Social History   Socioeconomic History  . Marital status: Widowed    Spouse name: Not on file  . Number of children: Not on file  . Years of education: Not on file  . Highest education level: Not on file  Occupational History  . Not on file  Tobacco Use  . Smoking status: Former Research scientist (life sciences)  . Smokeless tobacco: Never Used  Substance and Sexual Activity  . Alcohol use: Yes    Alcohol/week: 6.0 standard drinks    Types: 6 Cans of beer per week    Comment: 1 or 2 with dinner  . Drug use: No  . Sexual activity: Not on file  Other Topics Concern  . Not on file  Social History Narrative  . Not on file   Social Determinants of Health   Financial Resource Strain:   . Difficulty of Paying Living Expenses: Not on file  Food Insecurity:   . Worried About Charity fundraiser  in the Last Year: Not on file  . Ran Out of Food in the Last Year: Not on file  Transportation Needs:   . Lack of Transportation (Medical): Not on file  . Lack of Transportation (Non-Medical): Not on file  Physical Activity:   . Days of Exercise per Week: Not on file  . Minutes of Exercise per Session: Not on file  Stress:   . Feeling of Stress : Not on file  Social Connections:   . Frequency of Communication with Friends and Family: Not on file  . Frequency of Social Gatherings with Friends and Family: Not on file  . Attends Religious Services: Not on file  . Active Member of Clubs or Organizations: Not on file  . Attends Archivist  Meetings: Not on file  . Marital Status: Not on file  Intimate Partner Violence:   . Fear of Current or Ex-Partner: Not on file  . Emotionally Abused: Not on file  . Physically Abused: Not on file  . Sexually Abused: Not on file      Review of Systems  Constitutional: Negative for chills, fatigue and unexpected weight change.  HENT: Negative for congestion, rhinorrhea, sneezing and sore throat.   Eyes: Negative for redness.  Respiratory: Positive for shortness of breath. Negative for cough and chest tightness.   Cardiovascular: Negative for chest pain and palpitations.       Elevated blood pressure   Gastrointestinal: Negative for abdominal pain, constipation, diarrhea, nausea and vomiting.  Genitourinary: Positive for urgency. Negative for dysuria and frequency.  Musculoskeletal: Negative for arthralgias, back pain, joint swelling and neck pain.  Skin: Negative for rash.  Neurological: Negative.  Negative for tremors and numbness.  Hematological: Negative for adenopathy. Does not bruise/bleed easily.  Psychiatric/Behavioral: Negative for behavioral problems (Depression), sleep disturbance and suicidal ideas. The patient is not nervous/anxious.     Vital Signs: BP (!) 146/90   Pulse 71   Temp 97.6 F (36.4 C)   Resp 16   Ht 5\' 11"  (1.803 m)   Wt 215 lb 3.2 oz (97.6 kg)   SpO2 98%   BMI 30.01 kg/m    Physical Exam Constitutional:      General: He is not in acute distress.    Appearance: He is well-developed. He is not diaphoretic.  HENT:     Head: Normocephalic and atraumatic.     Mouth/Throat:     Pharynx: No oropharyngeal exudate.  Eyes:     Pupils: Pupils are equal, round, and reactive to light.  Neck:     Thyroid: No thyromegaly.     Vascular: Carotid bruit present. No JVD.     Trachea: No tracheal deviation.  Cardiovascular:     Rate and Rhythm: Normal rate and regular rhythm.     Heart sounds: Normal heart sounds. No murmur heard.  No friction rub. No  gallop.   Pulmonary:     Effort: Pulmonary effort is normal. No respiratory distress.     Breath sounds: No wheezing or rales.  Chest:     Chest wall: No tenderness.  Abdominal:     General: Bowel sounds are normal.     Palpations: Abdomen is soft.  Musculoskeletal:        General: Normal range of motion.     Cervical back: Normal range of motion and neck supple.  Lymphadenopathy:     Cervical: No cervical adenopathy.  Skin:    General: Skin is warm and dry.  Neurological:  Mental Status: He is alert and oriented to person, place, and time.     Cranial Nerves: No cranial nerve deficit.  Psychiatric:        Behavior: Behavior normal.        Thought Content: Thought content normal.        Judgment: Judgment normal.    Assessment/Plan: 1. Benign hypertension Start Norvasc 2.5 mg po qd, unable to tolerate metoprolol due to bradycardia   2. Occlusion and stenosis of bilateral carotid arteries Bruit on the right side, will get carotid dopplers  - US Carotid Bilateral; Future  3. Impaired fasting glucose glucometer is given so CBG can be monitored at home  - POCT CBG (Fasting - Glucose) - POCT HgB A1C ( 6.2), samples of Steglatro is given ( 5 mg )   4. Benign prostatic hyperplasia with urinary frequency Continue Flomax, will need to see urology  - Ambulatory referral to Urology  5. Flu vaccine need - Flu Vaccine MDCK QUAD PF  6. Coronary artery disease due to lipid rich plaque Pt needs to see cardiology, worsening sob hx of angioplasty   7. Hx of angioplasty Per cardiology ( RCA) takes asa only   General Counseling: Avelino verbalizes understanding of the findings of todays visit and agrees with plan of treatment. I have discussed any further diagnostic evaluation that may be needed or ordered today. We also reviewed his medications today. he has been encouraged to call the office with any questions or concerns that should arise related to todays visit.    Orders  Placed This Encounter  Procedures  . US Carotid Bilateral  . Flu Vaccine MDCK QUAD PF  . Ambulatory referral to Urology  . POCT CBG (Fasting - Glucose)  . POCT HgB A1C    Meds ordered this encounter  Medications  . amLODipine (NORVASC) 2.5 MG tablet    Sig: Take 1 tablet (2.5 mg total) by mouth daily.    Dispense:  90 tablet    Refill:  3    Total time spent: 35Minutes Time spent includes review of chart, medications, test results, and follow up plan with the patient.      Dr Lavera Guise Internal medicine

## 2020-09-08 ENCOUNTER — Telehealth: Payer: Self-pay

## 2020-09-08 ENCOUNTER — Other Ambulatory Visit: Payer: Self-pay

## 2020-09-08 ENCOUNTER — Ambulatory Visit (INDEPENDENT_AMBULATORY_CARE_PROVIDER_SITE_OTHER): Payer: Medicare Other

## 2020-09-08 DIAGNOSIS — I6523 Occlusion and stenosis of bilateral carotid arteries: Secondary | ICD-10-CM | POA: Diagnosis not present

## 2020-09-08 DIAGNOSIS — I779 Disorder of arteries and arterioles, unspecified: Secondary | ICD-10-CM

## 2020-09-08 HISTORY — DX: Disorder of arteries and arterioles, unspecified: I77.9

## 2020-09-08 MED ORDER — FREESTYLE LITE TEST VI STRP
ORAL_STRIP | 1 refills | Status: DC
Start: 2020-09-08 — End: 2021-02-06

## 2020-09-08 MED ORDER — LANCETS 30G MISC
1 refills | Status: DC
Start: 2020-09-08 — End: 2023-03-03

## 2020-09-08 MED ORDER — FREESTYLE LITE W/DEVICE KIT
1.0000 | PACK | Freq: Every day | 0 refills | Status: DC
Start: 2020-09-08 — End: 2023-03-03

## 2020-09-08 NOTE — Telephone Encounter (Signed)
Spoke with pt, discussed the accu-chek test strips were not covered by insurance so we ordered a device that was covered along with test strips and lancets. Confirmed pharmacy. Pt aware of change.

## 2020-09-12 ENCOUNTER — Ambulatory Visit: Payer: Self-pay | Admitting: Urology

## 2020-09-13 ENCOUNTER — Other Ambulatory Visit: Payer: Medicare Other

## 2020-09-13 NOTE — Telephone Encounter (Signed)
Diabetic meds were called in.

## 2020-09-19 DIAGNOSIS — I10 Essential (primary) hypertension: Secondary | ICD-10-CM | POA: Insufficient documentation

## 2020-09-21 ENCOUNTER — Encounter: Payer: Self-pay | Admitting: Urology

## 2020-09-21 ENCOUNTER — Ambulatory Visit (INDEPENDENT_AMBULATORY_CARE_PROVIDER_SITE_OTHER): Payer: Medicare Other | Admitting: Urology

## 2020-09-21 ENCOUNTER — Other Ambulatory Visit: Payer: Self-pay

## 2020-09-21 VITALS — BP 123/78 | HR 80 | Ht 71.0 in | Wt 198.0 lb

## 2020-09-21 DIAGNOSIS — R351 Nocturia: Secondary | ICD-10-CM

## 2020-09-21 DIAGNOSIS — N400 Enlarged prostate without lower urinary tract symptoms: Secondary | ICD-10-CM | POA: Diagnosis not present

## 2020-09-21 DIAGNOSIS — N401 Enlarged prostate with lower urinary tract symptoms: Secondary | ICD-10-CM

## 2020-09-21 DIAGNOSIS — N5201 Erectile dysfunction due to arterial insufficiency: Secondary | ICD-10-CM | POA: Diagnosis not present

## 2020-09-21 LAB — BLADDER SCAN AMB NON-IMAGING: Scan Result: 64

## 2020-09-21 NOTE — Progress Notes (Signed)
09/21/2020 10:59 AM   Mark Wyatt 1950/12/09 [id number]  Referring provider: Lavera Guise, [place] [place],  [place] [address]  Chief Complaint  Patient presents with  . Benign Prostatic Hypertrophy    HPI: Mark Wyatt is a 69 y.o. male seen at request of Dr. Clayborn Bigness for evaluation of lower urinary tract symptoms.   1.5-year history bothersome LUTS  Most bothersome symptoms sensation incomplete emptying, frequency, urgency, urge incontinence and nocturia 3-5  Voids with good stream and no hesitancy  IPSS 21/35  Started on tamsulosin approximately 1 year ago; titrated to 0.8 mg 3 months ago without significant improvement in symptoms  Denies dysuria, gross hematuria  Denies flank, abdominal or pelvic pain  No history of sleep apnea/snoring  Recently diagnosed with hyperglycemia and has follow-up next week for treatment recommendations  PSA October 2021 1.8  Also complains of difficulty achieving and maintaining an erection  Has been on sildenafil 100 mg which he states has not been effective  Organic risk factors include coronary artery disease hypercholesterolemia, antihypertensive medication and previous tobacco history     PMH: Past Medical History:  Diagnosis Date  . Hyperlipidemia     Surgical History: Past Surgical History:  Procedure Laterality Date  . ANAL FISTULOTOMY    . CORONARY STENT INTERVENTION N/A 02/24/2017   Procedure: Coronary Stent Intervention;  Surgeon: [name], MD;  Location: [facility];  Service: Cardiovascular;  Laterality: N/A;  . LEFT HEART CATH AND CORONARY ANGIOGRAPHY N/A 02/24/2017   Procedure: Left Heart Cath and Coronary Angiography;  Surgeon: [name], MD;  Location: [facility];  Service: Cardiovascular;  Laterality: N/A;    Home Medications:  Allergies as of 09/21/2020      Reactions   Penicillins Other (See Comments)   Headache Headache   Aspirin Other (See Comments)    02/05/2020-pt has been taking baby aspirin before bed and tolerating well.      Medication List       Accurate as of September 21, 2020 10:59 AM. If you have any questions, ask your nurse or doctor.        amLODipine 2.5 MG tablet Commonly known as: NORVASC Take 1 tablet (2.5 mg total) by mouth daily.   aspirin 81 MG chewable tablet Chew 1 tablet (81 mg total) by mouth daily.   atorvastatin 40 MG tablet Commonly known as: LIPITOR Take 1 tablet (40 mg total) by mouth daily at 6 PM.   FREESTYLE LITE test strip Generic drug: glucose blood Use as instructed as needed once a day DX impaired fasting glucose   FreeStyle Lite w/Device Kit 1 each by Does not apply route daily. Use as directed   Lancets 30G Misc Use as directed once a day DX impaired fasting glucose   nitroGLYCERIN 0.4 MG SL tablet Commonly known as: NITROSTAT Place 1 tablet (0.4 mg total) under the tongue every 5 (five) minutes as needed for chest pain.   sildenafil 100 MG tablet Commonly known as: Viagra Take 1 tablet (100 mg total) by mouth daily as needed for erectile dysfunction.   tamsulosin 0.4 MG Caps capsule Commonly known as: FLOMAX Take 2 capsules (0.8 mg total) by mouth daily after supper.       Allergies:  Allergies  Allergen Reactions  . Penicillins Other (See Comments)    Headache  Headache  . Aspirin Other (See Comments)    02/05/2020-pt has been taking baby aspirin before bed and tolerating well.  Family History: Family History  Problem Relation Age of Onset  . Diabetes Mother     Social History:  reports that he has quit smoking. He has never used smokeless tobacco. He reports current alcohol use of about 6.0 standard drinks of alcohol per week. He reports that he does not use drugs.   Physical Exam: BP 123/78   Pulse 80   Ht _0  (1.803 m)   Wt 198 lb (89.8 kg)   BMI 27.62 kg/m   Constitutional:  Alert, No acute distress. HEENT: [place] AT, moist mucus membranes.  Trachea  midline, no masses. Cardiovascular: No clubbing, cyanosis, or edema. Respiratory: Normal respiratory effort, no increased work of breathing. GU: Prostate 40 g, smooth without nodules Skin: No rashes, bruises or suspicious lesions. Neurologic: Grossly intact, no focal deficits, moving all 4 extremities. Psychiatric: Normal mood and affect.   Assessment & Plan:    1. Benign prostatic hyperplasia with LUTS  Bladder scan PVR 64 mL  Most bothersome symptoms are storage related without significant obstructive symptoms  No improvement on tamsulosin  Recommend trial Myrbetriq 50 mg  PA follow-up 1 month symptom reassessment  2.  Nocturia  We discussed the multiple potential etiologies of nocturia including nocturnal polyuria, sleep apnea, BPH and overactive bladder.  We also discussed that this particular symptom may be difficult to treat  Reassess after Myrbetriq trial  3.  Erectile dysfunction  He desired a trial of an alternate PDE 5 inhibitor.  We discussed that efficacy is similar of all medications in this class and 1 is not considered more effective than the other  Rx tadalafil 20 mg sent to [place] [place] [address], [place], [place] [address] [phone]

## 2020-09-22 ENCOUNTER — Encounter: Payer: Self-pay | Admitting: Urology

## 2020-09-22 LAB — URINALYSIS, COMPLETE
Bilirubin, UA: NEGATIVE
Glucose, UA: NEGATIVE
Ketones, UA: NEGATIVE
Leukocytes,UA: NEGATIVE
Nitrite, UA: NEGATIVE
Protein,UA: NEGATIVE
Specific Gravity, UA: 1.01 (ref 1.005–1.030)
Urobilinogen, Ur: 0.2 mg/dL (ref 0.2–1.0)
pH, UA: 6 (ref 5.0–7.5)

## 2020-09-22 LAB — MICROSCOPIC EXAMINATION: Bacteria, UA: NONE SEEN

## 2020-09-22 MED ORDER — TADALAFIL 20 MG PO TABS: ORAL_TABLET | ORAL | 0 refills | Status: DC

## 2020-09-22 NOTE — Procedures (Signed)
[place], [place] [address]  DATE OF SERVICE: 09/08/2020  CAROTID DOPPLER INTERPRETATION:  Bilateral Carotid Ultrsasound and Color Doppler Examination was performed. The RIGHT CCA shows moderate to severe plaque plaque in the vessel. The LEFT CCA shows mild plaque in the vessel. There was no significant intimal thickening noted in the RIGHT carotid artery. There was no significant intimal thickening in the LEFT carotid artery.  The RIGHT CCA shows peak systolic velocity of 98 cm per second. The end diastolic velocity is 16 cm per second on the RIGHT side. The RIGHT ICA shows peak systolic velocity of 382 per second. RIGHT sided ICA end diastolic velocity is 56 cm per second. The RIGHT ECA shows a peak systolic velocity of 76 cm per second. The ICA/CCA ratio is calculated to be 2.0. This suggests 50 to 70% stenosis. The Vertebral Artery shows antegrade flow.  The LEFT CCA shows peak systolic velocity of 505 cm per second. The end diastolic velocity is 20 cm per second on the LEFT side. The LEFT ICA shows peak systolic velocity of 72 per second. LEFT sided ICA end diastolic velocity is 20 cm per second. The LEFT ECA shows a peak systolic velocity of 97 cm per second. The ICA/CCA ratio is calculated to be 0.6. This suggests less than 50% stenosis. The Vertebral Artery shows antegrade flow.   Impression:    The RIGHT CAROTID shows 50 to 70% stenosis. The LEFT CAROTID shows less than 50% stenosis.  There is mild plaque formation noted on the LEFT and moderate to severe plaque on the RIGHT  side. Consider a repeat Carotid doppler if clinical situation and symptoms warrant in 6-12 months. Patient should be encouraged to change lifestyles such as smoking cessation, regular exercise and dietary modification. Use of statins in the right clinical setting and ASA is encouraged.  Allyne Gee, MD [facility] Pulmonary Critical Care Medicine

## 2020-09-25 ENCOUNTER — Ambulatory Visit (INDEPENDENT_AMBULATORY_CARE_PROVIDER_SITE_OTHER): Payer: Medicare Other | Admitting: Internal Medicine

## 2020-09-25 ENCOUNTER — Encounter: Payer: Self-pay | Admitting: Internal Medicine

## 2020-09-25 ENCOUNTER — Other Ambulatory Visit: Payer: Self-pay

## 2020-09-25 VITALS — BP 152/78 | HR 72 | Temp 97.9°F | Resp 16 | Ht 71.0 in | Wt 209.0 lb

## 2020-09-25 DIAGNOSIS — I700 Atherosclerosis of aorta: Secondary | ICD-10-CM

## 2020-09-25 DIAGNOSIS — E1165 Type 2 diabetes mellitus with hyperglycemia: Secondary | ICD-10-CM | POA: Diagnosis not present

## 2020-09-25 DIAGNOSIS — I6523 Occlusion and stenosis of bilateral carotid arteries: Secondary | ICD-10-CM | POA: Diagnosis not present

## 2020-09-25 DIAGNOSIS — I2583 Coronary atherosclerosis due to lipid rich plaque: Secondary | ICD-10-CM

## 2020-09-25 DIAGNOSIS — I2510 Atherosclerotic heart disease of native coronary artery without angina pectoris: Secondary | ICD-10-CM

## 2020-09-25 DIAGNOSIS — I10 Essential (primary) hypertension: Secondary | ICD-10-CM

## 2020-09-25 MED ORDER — EMPAGLIFLOZIN 10 MG PO TABS: 10.0000 mg | ORAL_TABLET | Freq: Every day | ORAL | 3 refills | Status: DC

## 2020-09-25 MED ORDER — AMLODIPINE BESYLATE 2.5 MG PO TABS
2.5000 mg | ORAL_TABLET | Freq: Every day | ORAL | 3 refills | Status: DC
Start: 2020-09-25 — End: 2021-07-05

## 2020-09-25 NOTE — Progress Notes (Signed)
[facility] [address], [place] [address]  Internal MEDICINE  Office Visit Note  Patient Name: Mark Wyatt  [id number]  [id number]  Date of Service: 09/25/2020  Chief Complaint  Patient presents with  . Follow-up  . Hyperlipidemia  . policy update form    scanned    HPI  Pt is here for routine follow up 1. BP is better, keeping a log at home. Was started on Norvasc 2.5 mg po qd, stopped lopressor due to bradycardia  2. Monitoring his blood sugars at home, has been above 124 most of the time, worse after eating 3. Carotid dopplers did show plaque on the right side 50/70% pt is asymptomatic 4. Cardiology app, no recent echo. Echo in 2018 showed hypokinesis of anterolateral wall,NSTEMI s/p PCI with an Alpine 2.5 x 60m DES to the distal RCA and PDA in 2018   Current Medication: Outpatient Encounter Medications as of 09/25/2020  Medication Sig  . amLODipine (NORVASC) 2.5 MG tablet Take 1 tablet (2.5 mg total) by mouth daily.  .Marland Kitchenaspirin 81 MG chewable tablet Chew 1 tablet (81 mg total) by mouth daily.  .Marland Kitchenatorvastatin (LIPITOR) 40 MG tablet Take 1 tablet (40 mg total) by mouth daily at 6 PM.  . Blood Glucose Monitoring Suppl (FREESTYLE LITE) w/Device KIT 1 each by Does not apply route daily. Use as directed  . glucose blood (FREESTYLE LITE) test strip Use as instructed as needed once a day DX impaired fasting glucose  . Lancets 30G MISC Use as directed once a day DX impaired fasting glucose  . mirabegron ER (MYRBETRIQ) 50 MG TB24 tablet Take 50 mg by mouth daily.  . nitroGLYCERIN (NITROSTAT) 0.4 MG SL tablet Place 1 tablet (0.4 mg total) under the tongue every 5 (five) minutes as needed for chest pain.  . sildenafil (VIAGRA) 100 MG tablet Take 1 tablet (100 mg total) by mouth daily as needed for erectile dysfunction.  . tadalafil (CIALIS) 20 MG tablet 1 tab 1 hour prior to intercourse  . tamsulosin (FLOMAX) 0.4 MG CAPS capsule Take 2 capsules (0.8 mg total) by  mouth daily after supper. (Patient taking differently: Take 0.8 mg by mouth daily after supper. Takes one tablet daily)  . [DISCONTINUED] amLODipine (NORVASC) 2.5 MG tablet Take 1 tablet (2.5 mg total) by mouth daily.  . empagliflozin (JARDIANCE) 10 MG TABS tablet Take 1 tablet (10 mg total) by mouth daily before breakfast.  . [DISCONTINUED] metoprolol tartrate (LOPRESSOR) 25 MG tablet Take 1 tablet (25 mg total) by mouth 2 (two) times daily.   No facility-administered encounter medications on file as of 09/25/2020.    Surgical History: Past Surgical History:  Procedure Laterality Date  . ANAL FISTULOTOMY    . CORONARY STENT INTERVENTION N/A 02/24/2017   Procedure: Coronary Stent Intervention;  Surgeon: CYolonda Kida MD;  [facility];  Service: Cardiovascular;  Laterality: N/A;  . LEFT HEART CATH AND CORONARY ANGIOGRAPHY N/A 02/24/2017   Procedure: Left Heart Cath and Coronary Angiography;  Surgeon: FTeodoro Spray MD;  [facility];  Service: Cardiovascular;  Laterality: N/A;    Medical History: Past Medical History:  Diagnosis Date  . Hyperlipidemia   . Overactive bladder    [facility]    Family History: Family History  Problem Relation Age of Onset  . Diabetes Mother     Social History   Socioeconomic History  . Marital status: Married    Spouse name: Not on file  .  Number of children: Not on file  . Years of education: Not on file  . Highest education level: Not on file  Occupational History  . Not on file  Tobacco Use  . Smoking status: Former Research scientist (life sciences)  . Smokeless tobacco: Never Used  Substance and Sexual Activity  . Alcohol use: Yes    Alcohol/week: 6.0 standard drinks    Types: 6 Cans of beer per week    Comment: 1 or 2 with dinner  . Drug use: No  . Sexual activity: Not on file  Other Topics Concern  . Not on file  Social History Narrative  . Not on file   Social Determinants of Health   Financial  Resource Strain:   . Difficulty of Paying Living Expenses: Not on file  Food Insecurity:   . Worried About Charity fundraiser in the Last Year: Not on file  . Ran Out of Food in the Last Year: Not on file  Transportation Needs:   . Lack of Transportation (Medical): Not on file  . Lack of Transportation (Non-Medical): Not on file  Physical Activity:   . Days of Exercise per Week: Not on file  . Minutes of Exercise per Session: Not on file  Stress:   . Feeling of Stress : Not on file  Social Connections:   . Frequency of Communication with Friends and Family: Not on file  . Frequency of Social Gatherings with Friends and Family: Not on file  . Attends Religious Services: Not on file  . Active Member of Clubs or Organizations: Not on file  . Attends Archivist Meetings: Not on file  . Marital Status: Not on file  Intimate Partner Violence:   . Fear of Current or Ex-Partner: Not on file  . Emotionally Abused: Not on file  . Physically Abused: Not on file  . Sexually Abused: Not on file      Review of Systems  Constitutional: Negative for chills, fatigue and unexpected weight change.  HENT: Negative for congestion, postnasal drip, rhinorrhea, sneezing and sore throat.   Eyes: Negative for redness.  Respiratory: Negative for cough, chest tightness and shortness of breath.   Cardiovascular: Negative for chest pain and palpitations.  Gastrointestinal: Negative for abdominal pain, constipation, diarrhea, nausea and vomiting.  Genitourinary: Negative for dysuria and frequency.  Musculoskeletal: Negative for arthralgias, back pain, joint swelling and neck pain.  Skin: Negative for rash.  Neurological: Negative.  Negative for tremors and numbness.  Hematological: Negative for adenopathy. Does not bruise/bleed easily.  Psychiatric/Behavioral: Negative for behavioral problems (Depression), sleep disturbance and suicidal ideas. The patient is not nervous/anxious.     Vital  Signs: BP (!) 152/78   Pulse 72   Temp 97.9 F (36.6 C)   Resp 16   Ht '5\' 11"'  (1.803 m)   Wt 209 lb (94.8 kg)   SpO2 97%   BMI 29.15 kg/m    Physical Exam Constitutional:      General: He is not in acute distress.    Appearance: He is well-developed. He is not diaphoretic.  HENT:     Head: Normocephalic and atraumatic.     Mouth/Throat:     Pharynx: No oropharyngeal exudate.  Eyes:     Pupils: Pupils are equal, round, and reactive to light.  Neck:     Thyroid: No thyromegaly.     Vascular: No JVD.     Trachea: No tracheal deviation.  Cardiovascular:     Rate and Rhythm: Normal  rate and regular rhythm.     Heart sounds: Normal heart sounds. No murmur heard.  No friction rub. No gallop.   Pulmonary:     Effort: Pulmonary effort is normal. No respiratory distress.     Breath sounds: No wheezing or rales.  Chest:     Chest wall: No tenderness.  Abdominal:     General: Bowel sounds are normal.     Palpations: Abdomen is soft.  Musculoskeletal:        General: Normal range of motion.     Cervical back: Normal range of motion and neck supple.  Lymphadenopathy:     Cervical: No cervical adenopathy.  Skin:    General: Skin is warm and dry.  Neurological:     Mental Status: He is alert and oriented to person, place, and time.     Cranial Nerves: No cranial nerve deficit.  Psychiatric:        Behavior: Behavior normal.        Thought Content: Thought content normal.        Judgment: Judgment normal.     Assessment/Plan: 1. Coronary atherosclerosis due to lipid rich plaque Followed by cardiology, pt should have an echo due to previous hypokinesis   2. Atherosclerosis of aorta (HCC) Continue Lipitor and ASA  3. Occlusion and stenosis of bilateral carotid arteries Asymptomatic, will continue to monitor   4. Uncontrolled type 2 diabetes mellitus with hyperglycemia (HCC) Start Jardiance 10 mg po qd, monitor blood sugars  5. Benign hypertension Continue Norvasc    General Counseling: Murice verbalizes understanding of the findings of todays visit and agrees with plan of treatment. I have discussed any further diagnostic evaluation that may be needed or ordered today. We also reviewed his medications today. he has been encouraged to call the office with any questions or concerns that should arise related to todays visit.   Meds ordered this encounter  Medications  . empagliflozin (JARDIANCE) 10 MG TABS tablet    Sig: Take 1 tablet (10 mg total) by mouth daily before breakfast.    Dispense:  90 tablet    Refill:  3  . amLODipine (NORVASC) 2.5 MG tablet    Sig: Take 1 tablet (2.5 mg total) by mouth daily.    Dispense:  90 tablet    Refill:  3    Total time spent: 35 Minutes Time spent includes review of chart, medications, test results, and follow up plan with the patient.      Dr Lavera Guise Internal medicine

## 2020-09-25 NOTE — Progress Notes (Signed)
Discussed.

## 2020-09-27 ENCOUNTER — Other Ambulatory Visit: Payer: Self-pay | Admitting: *Deleted

## 2020-09-27 MED ORDER — TADALAFIL 20 MG PO TABS
ORAL_TABLET | ORAL | 0 refills | Status: DC
Start: 2020-09-27 — End: 2020-10-06

## 2020-10-04 ENCOUNTER — Telehealth: Payer: Self-pay

## 2020-10-04 NOTE — Telephone Encounter (Signed)
done

## 2020-10-06 MED ORDER — TADALAFIL 20 MG PO TABS
ORAL_TABLET | ORAL | 0 refills | Status: DC
Start: 2020-10-06 — End: 2021-05-23

## 2020-10-06 NOTE — Addendum Note (Signed)
Addended by: Despina Hidden on: 10/06/2020 04:30 PM   Modules accepted: Orders

## 2020-10-09 ENCOUNTER — Telehealth: Payer: Self-pay

## 2020-10-09 ENCOUNTER — Other Ambulatory Visit: Payer: Self-pay

## 2020-10-09 MED ORDER — METFORMIN HCL ER 500 MG PO TB24: 500.0000 mg | ORAL_TABLET | Freq: Every day | ORAL | 1 refills | Status: DC

## 2020-10-09 NOTE — Telephone Encounter (Signed)
DFK approved for Metformin 500 mg XR once daily.  New prescription sent to pharmacy and pt notified

## 2020-10-09 NOTE — Telephone Encounter (Signed)
Pt wife advised samples ready for pickup for jardiance 10 mg

## 2020-10-23 ENCOUNTER — Encounter: Payer: Self-pay | Admitting: Physician Assistant

## 2020-10-23 ENCOUNTER — Other Ambulatory Visit: Payer: Self-pay | Admitting: Physician Assistant

## 2020-10-23 ENCOUNTER — Ambulatory Visit (INDEPENDENT_AMBULATORY_CARE_PROVIDER_SITE_OTHER): Payer: Medicare Other | Admitting: Physician Assistant

## 2020-10-23 ENCOUNTER — Other Ambulatory Visit: Payer: Self-pay

## 2020-10-23 VITALS — BP 134/85 | HR 87 | Ht 71.0 in | Wt 195.0 lb

## 2020-10-23 DIAGNOSIS — N401 Enlarged prostate with lower urinary tract symptoms: Secondary | ICD-10-CM

## 2020-10-23 DIAGNOSIS — R351 Nocturia: Secondary | ICD-10-CM | POA: Diagnosis not present

## 2020-10-23 DIAGNOSIS — N5201 Erectile dysfunction due to arterial insufficiency: Secondary | ICD-10-CM | POA: Diagnosis not present

## 2020-10-23 LAB — BLADDER SCAN AMB NON-IMAGING: Scan Result: 65

## 2020-10-23 MED ORDER — TROSPIUM CHLORIDE 20 MG PO TABS: 20.0000 mg | ORAL_TABLET | Freq: Two times a day (BID) | ORAL | 0 refills | Status: DC

## 2020-10-23 NOTE — Progress Notes (Signed)
10/23/2020 10:31 AM   [name] 1950-12-25 [id number]  CC: Chief Complaint  Patient presents with  . Benign Prostatic Hypertrophy    HPI: Stone Spirito is a 70 y.o. male with PMH BPH with LUTS on Flomax 0.4 mg daily and Myrbetriq 50 mg daily, nocturia, and erectile dysfunction on tadalafil who presents today for symptom recheck and PVR. He was seen in clinic most recently by Dr. Bernardo Heater 1 month ago and started on Myrbetriq and switched from sildenafil to tadalafil at that time.  Regarding his LUTS, today he reports no change in his urinary symptoms on Myrbetriq. IPSS 21/5 as below, previously 21/6. PVR 65 mL.  He does not believe Flomax helped either.  He denies a history of constipation, dry mouth, and dry eye.  He states he would possibly be interested in pursuing an outlet procedure, however he is concerned about possible worsening of his OAB symptoms with this.  Regarding his ED, SHIM 13 today. He reports that the tadalafil is working and has improved his erectile dysfunction.   IPSS    Row Name 10/23/20 1000         International Prostate Symptom Score   How often have you had the sensation of not emptying your bladder? More than half the time     How often have you had to urinate less than every two hours? More than half the time     How often have you found you stopped and started again several times when you urinated? About half the time     How often have you found it difficult to postpone urination? Almost always     How often have you had a weak urinary stream? Not at All     How often have you had to strain to start urination? Not at All     How many times did you typically get up at night to urinate? 5 Times     Total IPSS Score 21           Quality of Life due to urinary symptoms   If you were to spend the rest of your life with your urinary condition just the way it is now how would you feel about that? Unhappy             PMH: Past Medical History:   Diagnosis Date  . Hyperlipidemia   . Overactive bladder    [facility]    Surgical History: Past Surgical History:  Procedure Laterality Date  . ANAL FISTULOTOMY    . CORONARY STENT INTERVENTION N/A 02/24/2017   Procedure: Coronary Stent Intervention;  Surgeon: [name], MD;  Location: [facility];  Service: Cardiovascular;  Laterality: N/A;  . LEFT HEART CATH AND CORONARY ANGIOGRAPHY N/A 02/24/2017   Procedure: Left Heart Cath and Coronary Angiography;  Surgeon: [name], MD;  Location: [facility];  Service: Cardiovascular;  Laterality: N/A;    Home Medications:  Allergies as of 10/23/2020      Reactions   Penicillins Other (See Comments)   Headache Headache   Aspirin Other (See Comments)   02/05/2020-pt has been taking baby aspirin before bed and tolerating well.      Medication List       Accurate as of October 23, 2020 10:31 AM. If you have any questions, ask your nurse or doctor.        amLODipine 2.5 MG tablet Commonly known as: NORVASC Take 1 tablet (2.5 mg  total) by mouth daily.   aspirin 81 MG chewable tablet Chew 1 tablet (81 mg total) by mouth daily.   atorvastatin 40 MG tablet Commonly known as: LIPITOR Take 1 tablet (40 mg total) by mouth daily at 6 PM.   empagliflozin 10 MG Tabs tablet Commonly known as: Jardiance Take 1 tablet (10 mg total) by mouth daily before breakfast.   FREESTYLE LITE test strip Generic drug: glucose blood Use as instructed as needed once a day DX impaired fasting glucose   FreeStyle Lite w/Device Kit 1 each by Does not apply route daily. Use as directed   Lancets 30G Misc Use as directed once a day DX impaired fasting glucose   metFORMIN 500 MG 24 hr tablet Commonly known as: GLUCOPHAGE-XR Take 1 tablet (500 mg total) by mouth daily with breakfast.   Myrbetriq 50 MG Tb24 tablet Generic drug: mirabegron ER Take 50 mg by mouth daily.   nitroGLYCERIN 0.4 MG SL tablet Commonly  known as: NITROSTAT Place 1 tablet (0.4 mg total) under the tongue every 5 (five) minutes as needed for chest pain.   tadalafil 20 MG tablet Commonly known as: CIALIS 1 tab 1 hour prior to intercourse   tamsulosin 0.4 MG Caps capsule Commonly known as: FLOMAX Take 2 capsules (0.8 mg total) by mouth daily after supper. What changed: additional instructions       Allergies:  Allergies  Allergen Reactions  . Penicillins Other (See Comments)    Headache  Headache  . Aspirin Other (See Comments)    02/05/2020-pt has been taking baby aspirin before bed and tolerating well.    Family History: Family History  Problem Relation Age of Onset  . Diabetes Mother     Social History:   reports that he has quit smoking. He has never used smokeless tobacco. He reports current alcohol use of about 6.0 standard drinks of alcohol per week. He reports that he does not use drugs.  Physical Exam: BP 134/85   Pulse 87   Ht _0  (1.803 m)   Wt 195 lb (88.5 kg)   BMI 27.20 kg/m   Constitutional:  Alert and oriented, no acute distress, nontoxic appearing HEENT: [place], AT Cardiovascular: No clubbing, cyanosis, or edema Respiratory: Normal respiratory effort, no increased work of breathing Skin: No rashes, bruises or suspicious lesions Neurologic: Grossly intact, no focal deficits, moving all 4 extremities Psychiatric: Normal mood and affect  Laboratory Data: Results for orders placed or performed in visit on 10/23/20  Bladder Scan (Post Void Residual) in office  Result Value Ref Range   Scan Result 65    Assessment & Plan:   1. Benign prostatic hyperplasia with nocturia No symptomatic improvement on Myrbetriq.  Counseled him to stop this, continue Flomax, and will initiate a trial of trospium 20 mg twice daily x1 month with plans for symptom recheck and PVR at his next visit.  We discussed today that his storage type voiding symptoms may be secondary to BPH or OAB.  Recommend cystoscopy  for further evaluation.  Patient is in agreement with this plan - Bladder Scan (Post Void Residual) in office  2. Erectile dysfunction due to arterial insufficiency Continue tadalafil.  Return in about 3 weeks (around 11/13/2020) for Cystoscopy with Dr. Bernardo Heater.  Debroah Loop, PA-C  [facility] [address] [place], [place] [address] [phone]

## 2020-10-23 NOTE — Patient Instructions (Signed)
Continue Flomax. Stop Myrbetriq. Start trospium.  We'll see you back in clinic for cystoscopy with Dr. Lonna Cobb.

## 2020-11-22 ENCOUNTER — Other Ambulatory Visit: Payer: Self-pay

## 2020-11-22 ENCOUNTER — Encounter: Payer: Self-pay | Admitting: Urology

## 2020-11-22 ENCOUNTER — Ambulatory Visit (INDEPENDENT_AMBULATORY_CARE_PROVIDER_SITE_OTHER): Payer: Medicare Other | Admitting: Urology

## 2020-11-22 VITALS — BP 144/76 | HR 82 | Ht 71.0 in | Wt 195.0 lb

## 2020-11-22 DIAGNOSIS — R351 Nocturia: Secondary | ICD-10-CM | POA: Diagnosis not present

## 2020-11-22 DIAGNOSIS — N401 Enlarged prostate with lower urinary tract symptoms: Secondary | ICD-10-CM | POA: Diagnosis not present

## 2020-11-22 NOTE — Progress Notes (Signed)
   11/22/20  CC:  Chief Complaint  Patient presents with  . Cysto    HPI: Initially seen 12/2 for moderate-severe lower urinary tract symptoms with most bothersome symptoms of frequency, urgency, nocturia x4 and sensation of incomplete bladder emptying.  PVR 65 mL.  No improvement on tamsulosin or Myrbetriq.  He has noted improvement in his storage related voiding symptoms with decreasing his water and caffeine intake  There were no vitals taken for this visit. NED. A&Ox3.   No respiratory distress   Abd soft, NT, ND Normal phallus with bilateral descended testicles  Cystoscopy Procedure Note  Patient identification was confirmed, informed consent was obtained, and patient was prepped using Betadine solution.  Lidocaine jelly was administered per urethral meatus.     Pre-Procedure: - Inspection reveals a normal caliber urethral meatus.  Procedure: The flexible cystoscope was introduced without difficulty - No urethral strictures/lesions are present. - Moderate lateral lobe enlargement prostate  - Mild elevation bladder neck - Bilateral ureteral orifices identified - Bladder mucosa  reveals no ulcers, tumors, or lesions - No bladder stones - No trabeculation; small posterior wall bladder diverticulum  Retroflexion shows no lesions or intravesical median lobe   Post-Procedure: - Patient tolerated the procedure well  Assessment/ Plan:  Moderate prostate enlargement  He had indicated in his last visit with S. Vaillancourt he might be interested in outlet surgery however does not desire to pursue with improvement in his symptoms with decreasing fluid/caffeine  I offered him a trial of a second alpha-blocker though he desires to hold off  Follow-up 6 months instructed call earlier for worsening voiding symptoms  Would recommend urodynamics prior to any outlet procedure   Abbie Sons, MD

## 2020-11-23 LAB — MICROSCOPIC EXAMINATION
Bacteria, UA: NONE SEEN
Epithelial Cells (non renal): NONE SEEN /hpf (ref 0–10)

## 2020-11-23 LAB — URINALYSIS, COMPLETE
Bilirubin, UA: NEGATIVE
Glucose, UA: NEGATIVE
Ketones, UA: NEGATIVE
Leukocytes,UA: NEGATIVE
Nitrite, UA: NEGATIVE
Protein,UA: NEGATIVE
Specific Gravity, UA: 1.02 (ref 1.005–1.030)
Urobilinogen, Ur: 1 mg/dL (ref 0.2–1.0)
pH, UA: 5.5 (ref 5.0–7.5)

## 2020-11-29 ENCOUNTER — Other Ambulatory Visit: Payer: Self-pay

## 2020-11-29 ENCOUNTER — Ambulatory Visit (INDEPENDENT_AMBULATORY_CARE_PROVIDER_SITE_OTHER): Payer: Medicare Other | Admitting: Dermatology

## 2020-11-29 DIAGNOSIS — Z1283 Encounter for screening for malignant neoplasm of skin: Secondary | ICD-10-CM

## 2020-11-29 DIAGNOSIS — L578 Other skin changes due to chronic exposure to nonionizing radiation: Secondary | ICD-10-CM

## 2020-11-29 DIAGNOSIS — L820 Inflamed seborrheic keratosis: Secondary | ICD-10-CM

## 2020-11-29 DIAGNOSIS — L821 Other seborrheic keratosis: Secondary | ICD-10-CM

## 2020-11-29 DIAGNOSIS — D229 Melanocytic nevi, unspecified: Secondary | ICD-10-CM

## 2020-11-29 DIAGNOSIS — L814 Other melanin hyperpigmentation: Secondary | ICD-10-CM

## 2020-11-29 DIAGNOSIS — D1800 Hemangioma unspecified site: Secondary | ICD-10-CM

## 2020-11-29 NOTE — Progress Notes (Signed)
   New Patient Visit  Subjective  Mark Wyatt is a 70 y.o. male who presents for the following: Skin Problem (New pt c/o irritated itchy spots on his arms ). The patient presents for Upper Body Skin Exam (UBSE) for skin cancer screening and mole check.  The following portions of the chart were reviewed this encounter and updated as appropriate:   Tobacco  Allergies  Meds  Problems  Med Hx  Surg Hx  Fam Hx     Review of Systems:  No other skin or systemic complaints except as noted in HPI or Assessment and Plan.  Objective  Well appearing patient in no apparent distress; mood and affect are within normal limits.  All skin waist up examined.  Objective  arms x 10, chest x 6 (16): Erythematous keratotic or waxy stuck-on papule or plaque.    Assessment & Plan  Inflamed seborrheic keratosis (16) arms x 10, chest x 6  Destruction of lesion - arms x 10, chest x 6 Complexity: simple   Destruction method: cryotherapy   Informed consent: discussed and consent obtained   Timeout:  patient name, date of birth, surgical site, and procedure verified Lesion destroyed using liquid nitrogen: Yes   Region frozen until ice ball extended beyond lesion: Yes   Outcome: patient tolerated procedure well with no complications   Post-procedure details: wound care instructions given    Skin cancer screening  Lentigines - Scattered tan macules - Discussed due to sun exposure - Benign, observe - Call for any changes  Seborrheic Keratoses - Stuck-on, waxy, tan-brown papules and plaques  - Discussed benign etiology and prognosis. - Observe - Call for any changes  Melanocytic Nevi - Tan-brown and/or pink-flesh-colored symmetric macules and papules - Benign appearing on exam today - Observation - Call clinic for new or changing moles - Recommend daily use of broad spectrum spf 30+ sunscreen to sun-exposed areas.   Hemangiomas - Red papules - Discussed benign nature - Observe - Call  for any changes  Actinic Damage - Chronic, secondary to cumulative UV/sun exposure - diffuse scaly erythematous macules with underlying dyspigmentation - Recommend daily broad spectrum sunscreen SPF 30+ to sun-exposed areas, reapply every 2 hours as needed.  - Call for new or changing lesions.  Skin cancer screening performed today.  Return in about 3 months (around 02/26/2021) for ISK .  I, Marye Round, CMA, am acting as scribe for Sarina Ser, MD .  Documentation: I have reviewed the above documentation for accuracy and completeness, and I agree with the above.  Sarina Ser, MD

## 2020-11-29 NOTE — Patient Instructions (Signed)
Cryotherapy Aftercare  . Wash gently with soap and water everyday.   . Apply Vaseline and Band-Aid daily until healed.  

## 2020-12-02 ENCOUNTER — Encounter: Payer: Self-pay | Admitting: Dermatology

## 2020-12-27 ENCOUNTER — Ambulatory Visit (INDEPENDENT_AMBULATORY_CARE_PROVIDER_SITE_OTHER): Payer: Medicare Other | Admitting: Hospice and Palliative Medicine

## 2020-12-27 ENCOUNTER — Other Ambulatory Visit: Payer: Self-pay | Admitting: Hospice and Palliative Medicine

## 2020-12-27 VITALS — BP 138/74 | HR 60 | Temp 97.9°F | Resp 16 | Ht 71.0 in | Wt 192.0 lb

## 2020-12-27 DIAGNOSIS — I700 Atherosclerosis of aorta: Secondary | ICD-10-CM | POA: Diagnosis not present

## 2020-12-27 DIAGNOSIS — I10 Essential (primary) hypertension: Secondary | ICD-10-CM

## 2020-12-27 DIAGNOSIS — E1165 Type 2 diabetes mellitus with hyperglycemia: Secondary | ICD-10-CM

## 2020-12-27 DIAGNOSIS — E119 Type 2 diabetes mellitus without complications: Secondary | ICD-10-CM | POA: Diagnosis not present

## 2020-12-27 DIAGNOSIS — M109 Gout, unspecified: Secondary | ICD-10-CM | POA: Diagnosis not present

## 2020-12-27 LAB — POCT GLYCOSYLATED HEMOGLOBIN (HGB A1C): Hemoglobin A1C: 6 % — AB (ref 4.0–5.6)

## 2020-12-27 MED ORDER — COLCHICINE 0.6 MG PO TABS: 0.6000 mg | ORAL_TABLET | Freq: Every day | ORAL | 0 refills | Status: DC

## 2020-12-27 MED ORDER — PREDNISONE 10 MG PO TABS: ORAL_TABLET | ORAL | 0 refills | Status: DC

## 2020-12-27 NOTE — Progress Notes (Signed)
[facility] [place], [place] [address]  Internal MEDICINE  Office Visit Note  Patient Name: Mark Wyatt  [id number]  [id number]  Date of Service: 12/28/2020  Chief Complaint  Patient presents with  . Gout    In left foot     HPI Pt is here for a sick visit. Complaining of severe pain to his left big toe--has had three prior gout attacks in the past and the pain is similar to previous attacks Not currently on gout prevention medication Explains that his previous PCP would treat his gout flares with prednisone which would relieve his pain Pain started on Monday evening and has worsened each day--painful to touch which has made it hard for him to wear shoes and walk Denies excessive alcohol intake  DM-due for A1C testing, home glucose readings have been averaging 130-150, currently taking Jardiance and Metformin   Current Medication:  Outpatient Encounter Medications as of 12/27/2020  Medication Sig  . amLODipine (NORVASC) 2.5 MG tablet Take 1 tablet (2.5 mg total) by mouth daily.  Marland Kitchen aspirin 81 MG chewable tablet Chew 1 tablet (81 mg total) by mouth daily.  Marland Kitchen atorvastatin (LIPITOR) 40 MG tablet Take 1 tablet (40 mg total) by mouth daily at 6 PM.  . Blood Glucose Monitoring Suppl (FREESTYLE LITE) w/Device KIT 1 each by Does not apply route daily. Use as directed  . colchicine 0.6 MG tablet Take 1 tablet (0.6 mg total) by mouth daily.  . empagliflozin (JARDIANCE) 10 MG TABS tablet Take 1 tablet (10 mg total) by mouth daily before breakfast.  . glucose blood (FREESTYLE LITE) test strip Use as instructed as needed once a day DX impaired fasting glucose  . Lancets 30G MISC Use as directed once a day DX impaired fasting glucose  . metFORMIN (GLUCOPHAGE-XR) 500 MG 24 hr tablet Take 1 tablet (500 mg total) by mouth daily with breakfast.  . nitroGLYCERIN (NITROSTAT) 0.4 MG SL tablet Place 1 tablet (0.4 mg total) under the tongue every 5 (five) minutes as needed  for chest pain.  . predniSONE (DELTASONE) 10 MG tablet Take 1 tablet three times a day with a meal for three for three days, take 1 tablet by twice daily with a meal for 3 days, take 1 tablet once daily with a meal for 3 days  . tadalafil (CIALIS) 20 MG tablet 1 tab 1 hour prior to intercourse  . trospium (SANCTURA) 20 MG tablet TAKE 1 TABLET(20 MG) BY MOUTH TWICE DAILY  . [DISCONTINUED] metoprolol tartrate (LOPRESSOR) 25 MG tablet Take 1 tablet (25 mg total) by mouth 2 (two) times daily.   No facility-administered encounter medications on file as of 12/27/2020.      Medical History: Past Medical History:  Diagnosis Date  . Hyperlipidemia   . Overactive bladder    [facility]     Vital Signs: BP 138/74   Pulse 60   Temp 97.9 F (36.6 C)   Resp 16   Ht _0  (1.803 m)   Wt 192 lb (87.1 kg)   SpO2 99%   BMI 26.78 kg/m    Review of Systems  Constitutional: Negative for chills, fatigue and unexpected weight change.  HENT: Negative for congestion, postnasal drip, rhinorrhea, sneezing and sore throat.   Eyes: Negative for redness.  Respiratory: Negative for cough, chest tightness and shortness of breath.   Cardiovascular: Negative for chest pain and palpitations.  Gastrointestinal: Negative for abdominal pain, constipation, diarrhea, nausea and vomiting.  Genitourinary: Negative  for dysuria and frequency.  Musculoskeletal: Negative for arthralgias, back pain, joint swelling and neck pain.       Left big toe pain  Skin: Negative for rash.  Neurological: Negative for tremors and numbness.  Hematological: Negative for adenopathy. Does not bruise/bleed easily.  Psychiatric/Behavioral: Negative for behavioral problems (Depression), sleep disturbance and suicidal ideas. The patient is not nervous/anxious.     Physical Exam Vitals reviewed.  Constitutional:      Appearance: Normal appearance. He is normal weight.  Cardiovascular:     Rate and Rhythm: Normal rate and  regular rhythm.     Pulses: Normal pulses.     Heart sounds: Normal heart sounds.  Pulmonary:     Effort: Pulmonary effort is normal.     Breath sounds: Normal breath sounds.  Abdominal:     General: Abdomen is flat.     Palpations: Abdomen is soft.  Musculoskeletal:     Cervical back: Normal range of motion.     Comments: Left great toe//distal//erythema, painful to touch  Skin:    General: Skin is warm.  Neurological:     General: No focal deficit present.     Mental Status: He is alert and oriented to person, place, and time. Mental status is at baseline.  Psychiatric:        Mood and Affect: Mood normal.        Behavior: Behavior normal.        Thought Content: Thought content normal.        Judgment: Judgment normal.    Assessment/Plan: 1. Type 2 diabetes mellitus with hyperglycemia, without long-term current use of insulin (HCC) A1C 6.0, well controlled, continue with current therapy and routine monitoring - POCT glycosylated hemoglobin (Hb A1C)  2. Acute gout involving toe of left foot, unspecified cause Treat acute flare with prednisone for pain and colchicine to lower uric acid levels Will review uric acid levels and adjust therapy as indicated - predniSONE (DELTASONE) 10 MG tablet; Take 1 tablet three times a day with a meal for three for three days, take 1 tablet by twice daily with a meal for 3 days, take 1 tablet once daily with a meal for 3 days  Dispense: 18 tablet; Refill: 0 - Uric acid - colchicine 0.6 MG tablet; Take 1 tablet (0.6 mg total) by mouth daily.  Dispense: 30 tablet; Refill: 0  3. Atherosclerosis of aorta (HCC) Tolerating stain therapy, continue ASA  4. Essential hypertension BP and HR well controlled, continue to monitor  General Counseling: Yazir verbalizes understanding of the findings of todays visit and agrees with plan of treatment. I have discussed any further diagnostic evaluation that may be needed or ordered today. We also reviewed his  medications today. he has been encouraged to call the office with any questions or concerns that should arise related to todays visit.   Orders Placed This Encounter  Procedures  . Uric acid  . POCT glycosylated hemoglobin (Hb A1C)    Meds ordered this encounter  Medications  . predniSONE (DELTASONE) 10 MG tablet    Sig: Take 1 tablet three times a day with a meal for three for three days, take 1 tablet by twice daily with a meal for 3 days, take 1 tablet once daily with a meal for 3 days    Dispense:  18 tablet    Refill:  0  . colchicine 0.6 MG tablet    Sig: Take 1 tablet (0.6 mg total) by mouth daily.  Dispense:  30 tablet    Refill:  0    Time spent: 30 Minutes Time spent includes review of chart, medications, test results and follow-up plan with the patient.  This patient was seen by Theodoro Grist AGNP-C in Collaboration with Dr Lavera Guise as a part of collaborative care agreement.  Tanna Furry [facility] Internal Medicine

## 2020-12-28 ENCOUNTER — Encounter: Payer: Self-pay | Admitting: Hospice and Palliative Medicine

## 2020-12-28 LAB — URIC ACID: Uric Acid: 5.4 mg/dL (ref 3.8–8.4)

## 2021-01-09 ENCOUNTER — Encounter: Payer: Self-pay | Admitting: Hospice and Palliative Medicine

## 2021-01-09 ENCOUNTER — Ambulatory Visit (INDEPENDENT_AMBULATORY_CARE_PROVIDER_SITE_OTHER): Payer: Medicare Other | Admitting: Hospice and Palliative Medicine

## 2021-01-09 ENCOUNTER — Other Ambulatory Visit: Payer: Self-pay

## 2021-01-09 VITALS — BP 134/68 | HR 78 | Temp 97.5°F | Resp 16 | Ht 71.0 in | Wt 200.6 lb

## 2021-01-09 DIAGNOSIS — M109 Gout, unspecified: Secondary | ICD-10-CM | POA: Diagnosis not present

## 2021-01-09 DIAGNOSIS — R079 Chest pain, unspecified: Secondary | ICD-10-CM

## 2021-01-09 DIAGNOSIS — R1013 Epigastric pain: Secondary | ICD-10-CM | POA: Diagnosis not present

## 2021-01-09 LAB — CBC WITH DIFFERENTIAL/PLATELET
Basophils Absolute: 0 10*3/uL (ref 0.0–0.2)
Basos: 0 %
EOS (ABSOLUTE): 0 10*3/uL (ref 0.0–0.4)
Eos: 0 %
Hematocrit: 45.6 % (ref 37.5–51.0)
Hemoglobin: 15.3 g/dL (ref 13.0–17.7)
Immature Grans (Abs): 0.1 10*3/uL (ref 0.0–0.1)
Immature Granulocytes: 1 %
Lymphocytes Absolute: 1.2 10*3/uL (ref 0.7–3.1)
Lymphs: 12 %
MCH: 28.3 pg (ref 26.6–33.0)
MCHC: 33.6 g/dL (ref 31.5–35.7)
MCV: 84 fL (ref 79–97)
Monocytes Absolute: 1.6 10*3/uL — ABNORMAL HIGH (ref 0.1–0.9)
Monocytes: 17 %
Neutrophils Absolute: 6.5 10*3/uL (ref 1.4–7.0)
Neutrophils: 70 %
Platelets: 186 10*3/uL (ref 150–450)
RBC: 5.4 x10E6/uL (ref 4.14–5.80)
RDW: 14.2 % (ref 11.6–15.4)
WBC: 9.4 10*3/uL (ref 3.4–10.8)

## 2021-01-09 LAB — COMPREHENSIVE METABOLIC PANEL
ALT: 18 IU/L (ref 0–44)
AST: 18 IU/L (ref 0–40)
Albumin/Globulin Ratio: 1.4 (ref 1.2–2.2)
Albumin: 4.1 g/dL (ref 3.8–4.8)
Alkaline Phosphatase: 111 IU/L (ref 44–121)
BUN/Creatinine Ratio: 12 (ref 10–24)
BUN: 12 mg/dL (ref 8–27)
Bilirubin Total: 1.6 mg/dL — ABNORMAL HIGH (ref 0.0–1.2)
CO2: 24 mmol/L (ref 20–29)
Calcium: 8.9 mg/dL (ref 8.6–10.2)
Chloride: 96 mmol/L (ref 96–106)
Creatinine, Ser: 1.01 mg/dL (ref 0.76–1.27)
Globulin, Total: 3 g/dL (ref 1.5–4.5)
Glucose: 115 mg/dL — ABNORMAL HIGH (ref 65–99)
Potassium: 4 mmol/L (ref 3.5–5.2)
Sodium: 134 mmol/L (ref 134–144)
Total Protein: 7.1 g/dL (ref 6.0–8.5)
eGFR: 80 mL/min/{1.73_m2} (ref >59)

## 2021-01-09 LAB — LIPASE: Lipase: 23 U/L (ref 13–78)

## 2021-01-09 LAB — AMYLASE: Amylase: 48 U/L (ref 31–110)

## 2021-01-09 MED ORDER — PANTOPRAZOLE SODIUM 40 MG PO TBEC: 40.0000 mg | DELAYED_RELEASE_TABLET | Freq: Every day | ORAL | 3 refills | Status: DC

## 2021-01-09 NOTE — Progress Notes (Signed)
[facility] [place], [place] [address]  Internal MEDICINE  Office Visit Note  Patient Name: Mark Wyatt  [id number]  [id number]  Date of Service: 01/10/2021  Chief Complaint  Patient presents with  . Acute Visit    Burning in stomach and esophagus, water makes it worse but anything he eat and drinks causes this pain, started about a couple weeks ago but last week was worse, pt has chills since Saturday, abd and back hot to touch     HPI Pt is here for a sick visit. C/o of burning in his stomach that started about a week ago--since the weekend burning has intensified and is now severe Burning is exacerbated by anything he eats or drinks Burning is epigastric area and burns down mid stomach--when burning is severe also feels brining in his mid back Also having chills when he feels the burning, woke up this morning covered in sweat--has remained afebrile Having increased saliva production and nausea Denies vomiting and has not noticed any blood in his stools   Current Medication:  Outpatient Encounter Medications as of 01/09/2021  Medication Sig  . pantoprazole (PROTONIX) 40 MG tablet Take 1 tablet (40 mg total) by mouth daily.  Marland Kitchen amLODipine (NORVASC) 2.5 MG tablet Take 1 tablet (2.5 mg total) by mouth daily.  Marland Kitchen aspirin 81 MG chewable tablet Chew 1 tablet (81 mg total) by mouth daily.  Marland Kitchen atorvastatin (LIPITOR) 40 MG tablet Take 1 tablet (40 mg total) by mouth daily at 6 PM.  . Blood Glucose Monitoring Suppl (FREESTYLE LITE) w/Device KIT 1 each by Does not apply route daily. Use as directed  . empagliflozin (JARDIANCE) 10 MG TABS tablet Take 1 tablet (10 mg total) by mouth daily before breakfast.  . glucose blood (FREESTYLE LITE) test strip Use as instructed as needed once a day DX impaired fasting glucose  . Lancets 30G MISC Use as directed once a day DX impaired fasting glucose  . metFORMIN (GLUCOPHAGE-XR) 500 MG 24 hr tablet Take 1 tablet (500 mg total)  by mouth daily with breakfast.  . nitroGLYCERIN (NITROSTAT) 0.4 MG SL tablet Place 1 tablet (0.4 mg total) under the tongue every 5 (five) minutes as needed for chest pain.  . tadalafil (CIALIS) 20 MG tablet 1 tab 1 hour prior to intercourse  . trospium (SANCTURA) 20 MG tablet TAKE 1 TABLET(20 MG) BY MOUTH TWICE DAILY  . [DISCONTINUED] colchicine 0.6 MG tablet Take 1 tablet (0.6 mg total) by mouth daily.  . [DISCONTINUED] metoprolol tartrate (LOPRESSOR) 25 MG tablet Take 1 tablet (25 mg total) by mouth 2 (two) times daily.  . [DISCONTINUED] predniSONE (DELTASONE) 10 MG tablet Take 1 tablet three times a day with a meal for three for three days, take 1 tablet by twice daily with a meal for 3 days, take 1 tablet once daily with a meal for 3 days   No facility-administered encounter medications on file as of 01/09/2021.      Medical History: Past Medical History:  Diagnosis Date  . Gout   . Hyperlipidemia   . Overactive bladder    [facility]     Vital Signs: BP 134/68   Pulse 78   Temp (!) 97.5 F (36.4 C)   Resp 16   Ht '5\' 11"'  (1.803 m)   Wt 200 lb 9.6 oz (91 kg)   SpO2 98%   BMI 27.98 kg/m    Review of Systems  Constitutional: Positive for chills. Negative for fatigue and  unexpected weight change.  HENT: Negative for congestion, postnasal drip, rhinorrhea, sneezing and sore throat.   Eyes: Negative for redness.  Respiratory: Negative for cough, chest tightness and shortness of breath.   Cardiovascular: Negative for chest pain and palpitations.  Gastrointestinal: Positive for abdominal pain. Negative for constipation, diarrhea, nausea and vomiting.       Epigastric burning  Genitourinary: Negative for dysuria and frequency.  Musculoskeletal: Negative for arthralgias, back pain, joint swelling and neck pain.  Skin: Negative for rash.  Neurological: Negative for tremors and numbness.  Hematological: Negative for adenopathy. Does not bruise/bleed easily.   Psychiatric/Behavioral: Negative for behavioral problems (Depression), sleep disturbance and suicidal ideas. The patient is not nervous/anxious.     Physical Exam Vitals reviewed.  Constitutional:      Appearance: Normal appearance. He is normal weight.  Cardiovascular:     Rate and Rhythm: Normal rate and regular rhythm.     Pulses: Normal pulses.     Heart sounds: Normal heart sounds.  Pulmonary:     Effort: Pulmonary effort is normal.     Breath sounds: Normal breath sounds.  Abdominal:     General: Abdomen is flat.     Palpations: Abdomen is soft.     Tenderness: There is abdominal tenderness in the epigastric area.  Skin:    General: Skin is warm.  Neurological:     General: No focal deficit present.     Mental Status: He is alert and oriented to person, place, and time. Mental status is at baseline.  Psychiatric:        Mood and Affect: Mood normal.        Behavior: Behavior normal.        Thought Content: Thought content normal.        Judgment: Judgment normal.    Assessment/Plan: 1. Acute epigastric pain EKG to r/o cardiac etiology Start pantoprazole for symptoms and possible ulcer Will review labs and stat upper GI - EKG 12-Lead - pantoprazole (PROTONIX) 40 MG tablet; Take 1 tablet (40 mg total) by mouth daily.  Dispense: 30 tablet; Refill: 3 - CBC with Differential/Platelet - Comprehensive metabolic panel - Amylase - Lipase  2. Acute gout involving toe of left foot, unspecified cause Has resolved at this time, did recently complete course of colchicine--possibly contributing to his symptoms  General Counseling: Dmonte verbalizes understanding of the findings of todays visit and agrees with plan of treatment. I have discussed any further diagnostic evaluation that may be needed or ordered today. We also reviewed his medications today. he has been encouraged to call the office with any questions or concerns that should arise related to todays visit.   Orders  Placed This Encounter  Procedures  . CBC with Differential/Platelet  . Comprehensive metabolic panel  . Amylase  . Lipase  . EKG 12-Lead    Meds ordered this encounter  Medications  . pantoprazole (PROTONIX) 40 MG tablet    Sig: Take 1 tablet (40 mg total) by mouth daily.    Dispense:  30 tablet    Refill:  3    Time spent: 30 Minutes Time spent includes review of chart, medications, test results and follow-up plan with the patient.  This patient was seen by Theodoro Grist AGNP-C in Collaboration with Dr Lavera Guise as a part of collaborative care agreement.  Tanna Furry [facility] Internal Medicine

## 2021-01-10 ENCOUNTER — Encounter: Payer: Self-pay | Admitting: Hospice and Palliative Medicine

## 2021-01-10 ENCOUNTER — Encounter: Payer: Self-pay | Admitting: Internal Medicine

## 2021-01-10 ENCOUNTER — Telehealth: Payer: Self-pay

## 2021-01-10 ENCOUNTER — Ambulatory Visit
Admission: RE | Admit: 2021-01-10 | Discharge: 2021-01-10 | Disposition: A | Discharge status: Home / Self Care | Payer: Medicare Other | Source: Ambulatory Visit | Attending: Internal Medicine | Admitting: Internal Medicine

## 2021-01-10 ENCOUNTER — Other Ambulatory Visit: Payer: Self-pay | Admitting: Internal Medicine

## 2021-01-10 DIAGNOSIS — R1013 Epigastric pain: Secondary | ICD-10-CM | POA: Insufficient documentation

## 2021-01-10 NOTE — Telephone Encounter (Signed)
-----   Message from Lavera Guise, MD sent at 01/10/2021  7:09 AM EDT ----- Please advise pt his labs were not significantly abnormal, and how is his pain

## 2021-01-10 NOTE — Progress Notes (Signed)
Please advise pt his labs were not significantly abnormal, and how is his pain

## 2021-01-11 ENCOUNTER — Other Ambulatory Visit: Payer: Self-pay | Admitting: Hospice and Palliative Medicine

## 2021-01-11 DIAGNOSIS — K2980 Duodenitis without bleeding: Secondary | ICD-10-CM

## 2021-01-11 MED ORDER — METRONIDAZOLE 500 MG PO TABS: 500.0000 mg | ORAL_TABLET | Freq: Three times a day (TID) | ORAL | 0 refills | Status: DC

## 2021-01-11 MED ORDER — CIPROFLOXACIN HCL 500 MG PO TABS: 500.0000 mg | ORAL_TABLET | Freq: Two times a day (BID) | ORAL | 0 refills | Status: DC

## 2021-01-12 ENCOUNTER — Telehealth: Payer: Self-pay

## 2021-01-12 NOTE — Telephone Encounter (Signed)
Patient was scheduled with [facility] on 01/15/2021 @10 :30am for their urgent referral. Still unable to work in the [employer]. For documenting purposes. Mark Wyatt

## 2021-01-15 ENCOUNTER — Ambulatory Visit (INDEPENDENT_AMBULATORY_CARE_PROVIDER_SITE_OTHER): Payer: Medicare Other | Admitting: Gastroenterology

## 2021-01-15 ENCOUNTER — Encounter: Payer: Self-pay | Admitting: Gastroenterology

## 2021-01-15 ENCOUNTER — Other Ambulatory Visit: Payer: Self-pay

## 2021-01-15 VITALS — BP 132/73 | HR 65 | Temp 97.0°F | Ht 71.0 in | Wt 200.0 lb

## 2021-01-15 DIAGNOSIS — R17 Unspecified jaundice: Secondary | ICD-10-CM | POA: Diagnosis not present

## 2021-01-15 DIAGNOSIS — R1319 Other dysphagia: Secondary | ICD-10-CM

## 2021-01-15 DIAGNOSIS — R1013 Epigastric pain: Secondary | ICD-10-CM

## 2021-01-15 DIAGNOSIS — K2980 Duodenitis without bleeding: Secondary | ICD-10-CM

## 2021-01-15 NOTE — Progress Notes (Signed)
Mark Wyatt [address]  [place]  [place], [place] [address]  Main: [phone]  Fax: [phone]   Gastroenterology Consultation  Referring Provider:     Luiz Ochoa, NP Primary Care Physician:  Luiz Ochoa, NP Reason for Consultation:    duodenitis        HPI:    Chief Complaint  Patient presents with  . Duodenitis    Mark Wyatt is a 70 y.o. y/o male referred for consultation & management  by Dr. Kenton Kingfisher, Tanna Furry, NP.  Patient reports epigastric burning pain, 5 or 10, nonradiating, associated with burning sensation in chest for the last few weeks.  Patient was recently on prednisone and colchicine combination for gout.  Symptoms started 2 days after completion of therapy.  Upper GI study reports mild mucosal thickening of the duodenal bulb, duodenitis cannot be excluded.  Patient has never had an upper endoscopy.  Denies any altered bowel habits or blood in stool.  Reports having regular colonoscopies, at [facility].  Procedure report not available.  States last 1 was in 2020 or 2021 and no polyps were found.  Has had previous colonoscopy with polyps.  Reports that he was told that his next one should be in 5 years.  Patient is also reporting intermittent dysphagia to solids.  No episodes of food impaction.  Past Medical History:  Diagnosis Date  . Gout   . Hyperlipidemia   . Overactive bladder    [facility]    Past Surgical History:  Procedure Laterality Date  . ANAL FISTULOTOMY    . CORONARY STENT INTERVENTION N/A 02/24/2017   Procedure: Coronary Stent Intervention;  Surgeon: [name], MD;  Location: [facility];  Service: Cardiovascular;  Laterality: N/A;  . LEFT HEART CATH AND CORONARY ANGIOGRAPHY N/A 02/24/2017   Procedure: Left Heart Cath and Coronary Angiography;  Surgeon: [name], MD;  Location: [facility];  Service: Cardiovascular;  Laterality: N/A;    Prior to Admission medications    Medication Sig Start Date End Date Taking? Authorizing Provider  amLODipine (NORVASC) 2.5 MG tablet Take 1 tablet (2.5 mg total) by mouth daily. 09/25/20  Yes Lavera Guise, MD  aspirin 81 MG chewable tablet Chew 1 tablet (81 mg total) by mouth daily. 02/25/17  Yes Bettey Costa, MD  atorvastatin (LIPITOR) 40 MG tablet Take 1 tablet (40 mg total) by mouth daily at 6 PM. 07/28/20  Yes Scarboro, Audie Clear, NP  Blood Glucose Monitoring Suppl (FREESTYLE LITE) w/Device KIT 1 each by Does not apply route daily. Use as directed 09/08/20  Yes Lavera Guise, MD  ciprofloxacin (CIPRO) 500 MG tablet Take 1 tablet (500 mg total) by mouth 2 (two) times daily. 01/11/21  Yes Luiz Ochoa, NP  glucose blood (FREESTYLE LITE) test strip Use as instructed as needed once a day DX impaired fasting glucose 09/08/20  Yes Lavera Guise, MD  Lancets 30G MISC Use as directed once a day DX impaired fasting glucose 09/08/20  Yes Lavera Guise, MD  metFORMIN (GLUCOPHAGE-XR) 500 MG 24 hr tablet Take 1 tablet (500 mg total) by mouth daily with breakfast. 10/09/20  Yes Luiz Ochoa, NP  metroNIDAZOLE (FLAGYL) 500 MG tablet Take 1 tablet (500 mg total) by mouth 3 (three) times daily. 01/11/21  Yes Luiz Ochoa, NP  nitroGLYCERIN (NITROSTAT) 0.4 MG SL tablet Place 1 tablet (0.4 mg total) under the tongue every 5 (five) minutes as needed  for chest pain. 02/25/17  Yes Bettey Costa, MD  tadalafil (CIALIS) 20 MG tablet 1 tab 1 hour prior to intercourse 10/06/20  Yes Stoioff, Ronda Fairly, MD  trospium (SANCTURA) 20 MG tablet TAKE 1 TABLET(20 MG) BY MOUTH TWICE DAILY 10/23/20  Yes Vaillancourt, Samantha, PA-C  empagliflozin (JARDIANCE) 10 MG TABS tablet Take 1 tablet (10 mg total) by mouth daily before breakfast. Patient not taking: Reported on 01/15/2021 09/25/20   Lavera Guise, MD  pantoprazole (PROTONIX) 40 MG tablet Take 1 tablet (40 mg total) by mouth daily. Patient not taking: Reported on 01/15/2021 01/09/21   Lavera Guise, MD  predniSONE  (DELTASONE) 20 MG tablet  11/25/19   [provider]  sildenafil (VIAGRA) 100 MG tablet  05/31/20   [provider]  tamsulosin (FLOMAX) 0.4 MG CAPS capsule  05/31/19   [provider]  metoprolol tartrate (LOPRESSOR) 25 MG tablet Take 1 tablet (25 mg total) by mouth 2 (two) times daily. 02/25/17 09/06/19  Bettey Costa, MD    Family History  Problem Relation Age of Onset  . Diabetes Mother      Social History   Tobacco Use  . Smoking status: Former Research scientist (life sciences)  . Smokeless tobacco: Never Used  Substance Use Topics  . Alcohol use: Yes    Alcohol/week: 6.0 standard drinks    Types: 6 Cans of beer per week    Comment: 1 or 2 with dinner  . Drug use: No    Allergies as of 01/15/2021 - Review Complete 01/15/2021  Allergen Reaction Noted  . Penicillins Other (See Comments) 01/16/2011  . Aspirin Other (See Comments) 02/05/2012    Review of Systems:    All systems reviewed and negative except where noted in HPI.   Physical Exam:  BP 132/73   Pulse 65   Temp (!) 97 F (36.1 C) (Oral)   Ht '5\' 11"'  (1.803 m)   Wt 200 lb (90.7 kg)   BMI 27.89 kg/m  No LMP for male patient. Psych:  Alert and cooperative. Normal mood and affect. General:   Alert,  Well-developed, well-nourished, pleasant and cooperative in NAD Head:  Normocephalic and atraumatic. Eyes:  Sclera clear, no icterus.   Conjunctiva pink. Ears:  Normal auditory acuity. Nose:  No deformity, discharge, or lesions. Mouth:  No deformity or lesions,oropharynx pink & moist. Neck:  Supple; no masses or thyromegaly. Abdomen:  Normal bowel sounds.  No bruits.  Soft, non-tender and non-distended without masses, hepatosplenomegaly or hernias noted.  No guarding or rebound tenderness.    Msk:  Symmetrical without gross deformities. Good, equal movement & strength bilaterally. Pulses:  Normal pulses noted. Extremities:  No clubbing or edema.  No cyanosis. Neurologic:  Alert and oriented x3;  grossly normal  neurologically. Skin:  Intact without significant lesions or rashes. No jaundice. Lymph Nodes:  No significant cervical adenopathy. Psych:  Alert and cooperative. Normal mood and affect.   Labs: CBC    Component Value Date/Time   WBC 9.4 01/09/2021 1008   WBC 5.5 02/05/2020 0854   RBC 5.40 01/09/2021 1008   RBC 5.23 02/05/2020 0854   HGB 15.3 01/09/2021 1008   HCT 45.6 01/09/2021 1008   PLT 186 01/09/2021 1008   MCV 84 01/09/2021 1008   MCH 28.3 01/09/2021 1008   MCH 28.5 02/05/2020 0854   MCHC 33.6 01/09/2021 1008   MCHC 33.2 02/05/2020 0854   RDW 14.2 01/09/2021 1008   LYMPHSABS 1.2 01/09/2021 1008   MONOABS 1.1 (H) 09/06/2019  2041   EOSABS 0.0 01/09/2021 1008   BASOSABS 0.0 01/09/2021 1008   CMP     Component Value Date/Time   NA 134 01/09/2021 1008   K 4.0 01/09/2021 1008   CL 96 01/09/2021 1008   CO2 24 01/09/2021 1008   GLUCOSE 115 (H) 01/09/2021 1008   GLUCOSE 148 (H) 02/05/2020 0854   BUN 12 01/09/2021 1008   CREATININE 1.01 01/09/2021 1008   CALCIUM 8.9 01/09/2021 1008   PROT 7.1 01/09/2021 1008   ALBUMIN 4.1 01/09/2021 1008   AST 18 01/09/2021 1008   ALT 18 01/09/2021 1008   ALKPHOS 111 01/09/2021 1008   BILITOT 1.6 (H) 01/09/2021 1008   GFRNONAA 76 08/01/2020 1113   GFRAA 88 08/01/2020 1113    Imaging Studies: DG UGI W DOUBLE CM (HD BA)  Result Date: 01/10/2021 CLINICAL DATA:  Epigastric pain. EXAM: UPPER GI SERIES WITHOUT KUB TECHNIQUE: Routine upper GI series was performed with high density barium. FLUOROSCOPY TIME:  Fluoroscopy Time:  2 minutes 0 seconds Radiation Exposure Index (if provided by the fluoroscopic device): 64.2 mGy COMPARISON:  CT chest 02/05/2020. FINDINGS: Esophagus is widely patent. No focal esophageal abnormality identified. No significant reflux noted. Stomach appears normal. Mild mucosal thickening of the duodenal bulb be exclude. Duodenitis cannot be excluded. No focal ulceration. C-loop is normal. Visualized proximal small bowel  normal. IMPRESSION: Mild mucosal thickening of the duodenal bulb cannot be excluded. Duodenitis cannot be excluded. No evidence of focal ulceration. Electronically Signed   By: Marcello Moores  Register   On: 01/10/2021 08:36    Assessment and Plan:   Mark Wyatt is a 70 y.o. y/o male has been referred for duodenitis  Patient has dysphagia and burning sensation in epigastric region, with possible duodenitis seen on upper GI study.  EGD indicated for further evaluation of above symptoms.  Rule out peptic ulcer disease, esophageal strictures  I have discussed alternative options, risks & benefits,  which include, but are not limited to, bleeding, infection, perforation,respiratory complication & drug reaction.  The patient agrees with this plan & written consent will be obtained.    Total bilirubin intermittently elevated before.  Obtain fractionation   Dr Mark Wyatt  Speech recognition software was used to dictate the above note.

## 2021-01-23 ENCOUNTER — Other Ambulatory Visit: Payer: Self-pay

## 2021-01-23 ENCOUNTER — Other Ambulatory Visit
Admission: RE | Admit: 2021-01-23 | Discharge: 2021-01-23 | Disposition: A | Discharge status: Home / Self Care | Payer: Medicare Other | Source: Ambulatory Visit | Attending: Gastroenterology | Admitting: Gastroenterology

## 2021-01-23 DIAGNOSIS — Z01812 Encounter for preprocedural laboratory examination: Secondary | ICD-10-CM | POA: Diagnosis present

## 2021-01-23 DIAGNOSIS — Z20822 Contact with and (suspected) exposure to covid-19: Secondary | ICD-10-CM | POA: Diagnosis not present

## 2021-01-23 LAB — SARS CORONAVIRUS 2 (TAT 6-24 HRS): SARS Coronavirus 2: NEGATIVE

## 2021-01-25 ENCOUNTER — Encounter: Payer: Self-pay | Admitting: Gastroenterology

## 2021-01-25 ENCOUNTER — Other Ambulatory Visit: Payer: Self-pay

## 2021-01-25 ENCOUNTER — Ambulatory Visit: Payer: Medicare Other | Admitting: Hospice and Palliative Medicine

## 2021-01-25 ENCOUNTER — Ambulatory Visit: Payer: Medicare Other | Admitting: Registered Nurse

## 2021-01-25 ENCOUNTER — Ambulatory Visit
Admission: RE | Admit: 2021-01-25 | Discharge: 2021-01-25 | Disposition: A | Discharge status: Home / Self Care | Payer: Medicare Other | Attending: Gastroenterology | Admitting: Gastroenterology

## 2021-01-25 ENCOUNTER — Encounter
Admission: RE | Disposition: A | Discharge status: Home / Self Care | Payer: Self-pay | Source: Home / Self Care | Attending: Gastroenterology

## 2021-01-25 DIAGNOSIS — Z87891 Personal history of nicotine dependence: Secondary | ICD-10-CM | POA: Diagnosis not present

## 2021-01-25 DIAGNOSIS — K317 Polyp of stomach and duodenum: Secondary | ICD-10-CM | POA: Insufficient documentation

## 2021-01-25 DIAGNOSIS — K2289 Other specified disease of esophagus: Secondary | ICD-10-CM | POA: Diagnosis not present

## 2021-01-25 DIAGNOSIS — R1310 Dysphagia, unspecified: Secondary | ICD-10-CM | POA: Insufficient documentation

## 2021-01-25 DIAGNOSIS — Z7984 Long term (current) use of oral hypoglycemic drugs: Secondary | ICD-10-CM | POA: Insufficient documentation

## 2021-01-25 DIAGNOSIS — R933 Abnormal findings on diagnostic imaging of other parts of digestive tract: Secondary | ICD-10-CM | POA: Diagnosis not present

## 2021-01-25 DIAGNOSIS — K31819 Angiodysplasia of stomach and duodenum without bleeding: Secondary | ICD-10-CM | POA: Diagnosis not present

## 2021-01-25 DIAGNOSIS — K2980 Duodenitis without bleeding: Secondary | ICD-10-CM | POA: Diagnosis not present

## 2021-01-25 DIAGNOSIS — Z880 Allergy status to penicillin: Secondary | ICD-10-CM | POA: Insufficient documentation

## 2021-01-25 DIAGNOSIS — Z7952 Long term (current) use of systemic steroids: Secondary | ICD-10-CM | POA: Diagnosis not present

## 2021-01-25 DIAGNOSIS — K209 Esophagitis, unspecified without bleeding: Secondary | ICD-10-CM

## 2021-01-25 DIAGNOSIS — R1319 Other dysphagia: Secondary | ICD-10-CM | POA: Diagnosis not present

## 2021-01-25 DIAGNOSIS — K2100 Gastro-esophageal reflux disease with esophagitis, without bleeding: Secondary | ICD-10-CM | POA: Diagnosis not present

## 2021-01-25 DIAGNOSIS — R1013 Epigastric pain: Secondary | ICD-10-CM

## 2021-01-25 DIAGNOSIS — Z79899 Other long term (current) drug therapy: Secondary | ICD-10-CM | POA: Insufficient documentation

## 2021-01-25 DIAGNOSIS — Z955 Presence of coronary angioplasty implant and graft: Secondary | ICD-10-CM | POA: Diagnosis not present

## 2021-01-25 DIAGNOSIS — Z7982 Long term (current) use of aspirin: Secondary | ICD-10-CM | POA: Insufficient documentation

## 2021-01-25 DIAGNOSIS — Z886 Allergy status to analgesic agent status: Secondary | ICD-10-CM | POA: Insufficient documentation

## 2021-01-25 HISTORY — DX: Trigger thumb, right thumb: M65.321

## 2021-01-25 HISTORY — DX: Essential (primary) hypertension: I10

## 2021-01-25 HISTORY — DX: Trigger finger, right index finger: M65.321

## 2021-01-25 HISTORY — DX: Acute myocardial infarction, unspecified: I21.9

## 2021-01-25 HISTORY — DX: Trigger thumb, right thumb: M65.311

## 2021-01-25 HISTORY — PX: ESOPHAGOGASTRODUODENOSCOPY (EGD) WITH PROPOFOL: SHX5813

## 2021-01-25 HISTORY — DX: Trigger finger, right index finger: M65.341

## 2021-01-25 HISTORY — DX: Type 2 diabetes mellitus without complications: E11.9

## 2021-01-25 LAB — GLUCOSE, CAPILLARY: Glucose-Capillary: 119 mg/dL — ABNORMAL HIGH (ref 70–99)

## 2021-01-25 SURGERY — ESOPHAGOGASTRODUODENOSCOPY (EGD) WITH PROPOFOL
Anesthesia: General

## 2021-01-25 MED ORDER — PANTOPRAZOLE SODIUM 40 MG PO TBEC: 40.0000 mg | DELAYED_RELEASE_TABLET | Freq: Two times a day (BID) | ORAL | 0 refills | Status: DC

## 2021-01-25 MED ORDER — PHENYLEPHRINE HCL (PRESSORS) 10 MG/ML IV SOLN
INTRAVENOUS | Status: DC | PRN
  Administered 2021-01-25: 100 ug via INTRAVENOUS

## 2021-01-25 MED ORDER — SODIUM CHLORIDE 0.9 % IV SOLN
INTRAVENOUS | Status: DC
Start: 2021-01-25 — End: 2021-01-25

## 2021-01-25 MED ORDER — PROPOFOL 10 MG/ML IV BOLUS
INTRAVENOUS | Status: DC | PRN
  Administered 2021-01-25: 70 mg via INTRAVENOUS
  Administered 2021-01-25: 10 mg via INTRAVENOUS
  Administered 2021-01-25: 20 mg via INTRAVENOUS

## 2021-01-25 MED ORDER — PROPOFOL 500 MG/50ML IV EMUL
INTRAVENOUS | Status: DC | PRN
  Administered 2021-01-25: 150 ug/kg/min via INTRAVENOUS

## 2021-01-25 MED ORDER — LIDOCAINE HCL (CARDIAC) PF 100 MG/5ML IV SOSY
PREFILLED_SYRINGE | INTRAVENOUS | Status: DC | PRN
  Administered 2021-01-25: 100 mg via INTRAVENOUS

## 2021-01-25 NOTE — Anesthesia Postprocedure Evaluation (Signed)
Anesthesia Post Note  Patient: Ruthvik Barnaby  Procedure(s) Performed: ESOPHAGOGASTRODUODENOSCOPY (EGD) WITH PROPOFOL (N/A )  Patient location during evaluation: PACU Anesthesia Type: General Level of consciousness: awake and alert Pain management: pain level controlled Vital Signs Assessment: post-procedure vital signs reviewed and stable Respiratory status: spontaneous breathing, nonlabored ventilation and respiratory function stable Cardiovascular status: blood pressure returned to baseline and stable Postop Assessment: no apparent nausea or vomiting Anesthetic complications: no   No complications documented.   Last Vitals:  Vitals:   01/25/21 0834 01/25/21 0932  BP: 137/86   Pulse: 64   Resp: 18   Temp: (!) 36 C (!) 36 C  SpO2: 98%     Last Pain:  Vitals:   01/25/21 1002  TempSrc:   PainSc: 0-No pain                 Brett Canales Contrina Orona

## 2021-01-25 NOTE — Anesthesia Preprocedure Evaluation (Addendum)
Anesthesia Evaluation  Patient identified by MRN, date of birth, ID band Patient awake    Reviewed: Allergy & Precautions, H&P , NPO status , Patient's Chart, lab work & pertinent test results  History of Anesthesia Complications Negative for: history of anesthetic complications  Airway Mallampati: II  TM Distance: >3 FB     Dental  (+) Upper Dentures, Lower Dentures   Pulmonary neg sleep apnea, neg COPD, former smoker,    breath sounds clear to auscultation       Cardiovascular hypertension, (-) angina+ CAD, + Past MI (4-5 yrs ago) and + Cardiac Stents  (-) dysrhythmias  Rhythm:regular Rate:Normal     Neuro/Psych negative neurological ROS  negative psych ROS   GI/Hepatic negative GI ROS, Neg liver ROS,   Endo/Other  diabetes  Renal/GU negative Renal ROS  negative genitourinary   Musculoskeletal   Abdominal   Peds  Hematology negative hematology ROS (+)   Anesthesia Other Findings Past Medical History: No date: Gout No date: Hyperlipidemia No date: Overactive bladder     Comment:  [facility]  Past Surgical History: No date: ANAL FISTULOTOMY 02/24/2017: CORONARY STENT INTERVENTION; N/A     Comment:  Procedure: Coronary Stent Intervention;  Surgeon:               [name], MD;  Location: [facility];               Service: Cardiovascular;  Laterality: N/A; 02/24/2017: LEFT HEART CATH AND CORONARY ANGIOGRAPHY; N/A     Comment:  Procedure: Left Heart Cath and Coronary Angiography;                Surgeon: [name], MD;  Location: [facility];  Service: Cardiovascular;  Laterality: N/A;     Reproductive/Obstetrics negative OB ROS                            Anesthesia Physical Anesthesia Plan  ASA: III  Anesthesia Plan: General   Post-op Pain Management:    Induction:   PONV Risk Score and Plan: Propofol infusion and  TIVA  Airway Management Planned: Nasal Cannula  Additional Equipment:   Intra-op Plan:   Post-operative Plan:   Informed Consent: I have reviewed the patients History and Physical, chart, labs and discussed the procedure including the risks, benefits and alternatives for the proposed anesthesia with the patient or authorized representative who has indicated his/her understanding and acceptance.     Dental Advisory Given  Plan Discussed with: Anesthesiologist, CRNA and Surgeon  Anesthesia Plan Comments:        Anesthesia Quick Evaluation

## 2021-01-25 NOTE — H&P (Signed)
Vonda Antigua, MD [address], [place], [place], [place], [address] [address], [place], [place], [place], [address] Phone: [phone]  Fax: [phone]  Primary Care Physician:  Luiz Ochoa, NP   Pre-Procedure History & Physical: HPI:  Mark Wyatt is a 70 y.o. male is here for an EGD.   Past Medical History:  Diagnosis Date  . Diabetes mellitus without complication ([facility])   . Gout   . Hyperlipidemia   . Hypertension   . Myocardial infarction ([facility])    mild  . Overactive bladder    [facility]  . 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 ANAL FISTULOTOMY    . CARDIAC CATHETERIZATION    . CORONARY STENT INTERVENTION N/A 02/24/2017   Procedure: Coronary Stent Intervention;  Surgeon: [name], MD;  Location: [facility];  Service: Cardiovascular;  Laterality: N/A;  . LEFT HEART CATH AND CORONARY ANGIOGRAPHY N/A 02/24/2017   Procedure: Left Heart Cath and Coronary Angiography;  Surgeon: [name], MD;  Location: [facility];  Service: Cardiovascular;  Laterality: N/A;    Prior to Admission medications   Medication Sig Start Date End Date Taking? Authorizing Provider  amLODipine (NORVASC) 2.5 MG tablet Take 1 tablet (2.5 mg total) by mouth daily. 09/25/20  Yes Lavera Guise, MD  aspirin 81 MG chewable tablet Chew 1 tablet (81 mg total) by mouth daily. 02/25/17  Yes Bettey Costa, MD  atorvastatin (LIPITOR) 40 MG tablet Take 1 tablet (40 mg total) by mouth daily at 6 PM. 07/28/20  Yes Scarboro, Audie Clear, NP  metFORMIN (GLUCOPHAGE-XR) 500 MG 24 hr tablet Take 1 tablet (500 mg total) by mouth daily with breakfast. 10/09/20  Yes Luiz Ochoa, NP  trospium (SANCTURA) 20 MG tablet TAKE 1 TABLET(20 MG) BY MOUTH TWICE DAILY 10/23/20  Yes Vaillancourt, Samantha, PA-C  Blood Glucose Monitoring Suppl (FREESTYLE LITE) w/Device KIT 1 each by Does not apply route daily. Use as directed  09/08/20   Lavera Guise, MD  ciprofloxacin (CIPRO) 500 MG tablet Take 1 tablet (500 mg total) by mouth 2 (two) times daily. Patient not taking: Reported on 01/25/2021 01/11/21   Luiz Ochoa, NP  empagliflozin (JARDIANCE) 10 MG TABS tablet Take 1 tablet (10 mg total) by mouth daily before breakfast. Patient not taking: Reported on 01/15/2021 09/25/20   Lavera Guise, MD  glucose blood (FREESTYLE LITE) test strip Use as instructed as needed once a day DX impaired fasting glucose 09/08/20   Lavera Guise, MD  Lancets 30G MISC Use as directed once a day DX impaired fasting glucose 09/08/20   Lavera Guise, MD  metroNIDAZOLE (FLAGYL) 500 MG tablet Take 1 tablet (500 mg total) by mouth 3 (three) times daily. Patient not taking: Reported on 01/25/2021 01/11/21   Luiz Ochoa, NP  nitroGLYCERIN (NITROSTAT) 0.4 MG SL tablet Place 1 tablet (0.4 mg total) under the tongue every 5 (five) minutes as needed for chest pain. 02/25/17   Bettey Costa, MD  pantoprazole (PROTONIX) 40 MG tablet Take 1 tablet (40 mg total) by mouth daily. Patient not taking: Reported on 01/15/2021 01/09/21   Lavera Guise, MD  predniSONE (DELTASONE) 20 MG tablet  11/25/19   [provider]  sildenafil (VIAGRA) 100 MG tablet  05/31/20   [provider]  tadalafil (CIALIS) 20 MG tablet 1 tab 1 hour prior to intercourse 10/06/20  Abbie Sons, MD  tamsulosin (FLOMAX) 0.4 MG CAPS capsule  05/31/19   [provider]  metoprolol tartrate (LOPRESSOR) 25 MG tablet Take 1 tablet (25 mg total) by mouth 2 (two) times daily. 02/25/17 09/06/19  Bettey Costa, MD    Allergies as of 01/15/2021 - Review Complete 01/15/2021  Allergen Reaction Noted  . Penicillins Other (See Comments) 01/16/2011  . Aspirin Other (See Comments) 02/05/2012    Family History  Problem Relation Age of Onset  . Diabetes Mother     Social History   Socioeconomic History  . Marital status: Married    Spouse name: Not on file  . Number of  children: Not on file  . Years of education: Not on file  . Highest education level: Not on file  Occupational History  . Not on file  Tobacco Use  . Smoking status: Former Research scientist (life sciences)  . Smokeless tobacco: Never Used  Vaping Use  . Vaping Use: Never used  Substance and Sexual Activity  . Alcohol use: Yes    Alcohol/week: 6.0 standard drinks    Types: 6 Cans of beer per week    Comment: 1 or 2 with dinner  . Drug use: No  . Sexual activity: Not on file  Other Topics Concern  . Not on file  Social History Narrative  . Not on file   Social Determinants of Health   Financial Resource Strain: Not on file  Food Insecurity: Not on file  Transportation Needs: Not on file  Physical Activity: Not on file  Stress: Not on file  Social Connections: Not on file  Intimate Partner Violence: Not on file    Review of Systems: See HPI, otherwise negative ROS  Physical Exam: BP 137/86   Pulse 64   Temp (!) 96.8 F (36 C) (Skin)   Resp 18   Ht '5\' 11"'  (1.803 m)   Wt 83 kg   SpO2 98%   BMI 25.52 kg/m  General:   Alert,  pleasant and cooperative in NAD Head:  Normocephalic and atraumatic. Neck:  Supple; no masses or thyromegaly. Lungs:  Clear throughout to auscultation, normal respiratory effort.    Heart:  +S1, +S2, Regular rate and rhythm, No edema. Abdomen:  Soft, nontender and nondistended. Normal bowel sounds, without guarding, and without rebound.   Neurologic:  Alert and  oriented x4;  grossly normal neurologically.  Impression/Plan: Mark Wyatt is here for an EGD for dysphagia.  Risks, benefits, limitations, and alternatives regarding the procedure have been reviewed with the patient.  Questions have been answered.  All parties agreeable.   [name], MD  01/25/2021, 8:57 AM

## 2021-01-25 NOTE — Op Note (Signed)
[facility]astroenter[facility] Patient Name: Mark Wyatt Procedure Date: 01/25/2021 9:00 AM MRN: [id number] Account #: [id number] Date of Birth: Jul 12, 1951 Admit Type: Outpatient Age: 70 Room: [facility] ENDO ROOM 2 Gender: Male Note Status: Finalized Procedure:             Upper GI endoscopy Indications:           Dysphagia, Abnormal CT of the GI tract Providers:             Yosselin Zoeller B. Bonna Gains MD, MD Referring MD:          Forest Gleason Md, MD (Referring MD) Medicines:             Monitored Anesthesia Care Complications:         No immediate complications. Procedure:             Pre-Anesthesia Assessment:                        - The risks and benefits of the procedure and the                         sedation options and risks were discussed with the                         patient. All questions were answered and informed                         consent was obtained.                        - Patient identification and proposed procedure were                         verified prior to the procedure.                        - ASA Grade Assessment: II - A patient with mild                         systemic disease.                        After obtaining informed consent, the endoscope was                         passed under direct vision. Throughout the procedure,                         the patient's blood pressure, pulse, and oxygen                         saturations were monitored continuously. The Endoscope                         was introduced through the mouth, and advanced to the                         third part of duodenum. The upper GI endoscopy was  accomplished with ease. The patient tolerated the                         procedure well. Findings:      LA Grade B (one or more mucosal breaks greater than 5 mm, not extending       between the tops of two mucosal folds) esophagitis with no bleeding was       found in the distal esophagus.       White nummular lesions were noted in the distal esophagus. Biopsies were       taken with a cold forceps for histology.      The exam of the esophagus was otherwise normal.      There is no endoscopic evidence of stenosis or stricture in the entire       esophagus. Biopsies were obtained from the proximal and distal esophagus       with cold forceps for histology of suspected eosinophilic esophagitis.      A single 6 mm sessile polyp with no stigmata of recent bleeding was       found in the cardia. Imaging was performed using narrow band imaging to       visualize the mucosa. Biopsies were taken with a cold forceps for       histology.      The exam of the stomach was otherwise normal.      A single 4 mm angioectasia without bleeding was found in the third       portion of the duodenum. Coagulation for bleeding prevention using argon       plasma was successful.      Patchy mildly erythematous mucosa without active bleeding and with no       stigmata of bleeding was found in the duodenal bulb and in the second       portion of the duodenum. Biopsies were taken with a cold forceps for       histology. The biopsies were done away from and proximal to the site of       the AVM and no bleeding was present from the biopsies.      Nodular mucosa was found in the duodenal bulb. Biopsies were taken with       a cold forceps for histology.      The exam of the duodenum was otherwise normal. Impression:            - LA Grade B reflux esophagitis with no bleeding.                        - White nummular lesions in esophageal mucosa.                         Biopsied.                        - A single gastric polyp. Biopsied.                        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                         Treated with argon plasma coagulation (APC).                        -  Erythematous duodenopathy. Biopsied.                        - Nodular mucosa in the duodenal bulb.  Biopsied. Recommendation:        - Await pathology results.                        - Discharge patient to home.                        - Continue present medications.                        - Resume previous diet.                        - The findings and recommendations were discussed with                         the patient.                        - The findings and recommendations were discussed with                         the patient's family.                        - Return to primary care physician as previously                         scheduled.                        - Take prescribed proton pump inhibitor or H2 blocker                         (antacid) medications 30 - 60 minutes before meals.                        - Follow an antireflux regimen. Procedure Code(s):     --- Professional ---                        69629, 68, Esophagogastroduodenoscopy, flexible,                         transoral; with control of bleeding, any method                        43239, Esophagogastroduodenoscopy, flexible,                         transoral; with biopsy, single or multiple Diagnosis Code(s):     --- Professional ---                        K21.00, Gastro-esophageal reflux disease with                         esophagitis, without bleeding  K22.8, Other specified diseases of esophagus                        K31.7, Polyp of stomach and duodenum                        K31.819, Angiodysplasia of stomach and duodenum                         without bleeding                        K31.89, Other diseases of stomach and duodenum                        R13.10, Dysphagia, unspecified                        R93.3, Abnormal findings on diagnostic imaging of                         other parts of digestive tract CPT copyright 2019 American Medical Association. All rights reserved. The codes documented in this report are preliminary and upon coder review may  be revised to meet  current compliance requirements.  Vonda Antigua, MD Margretta Sidle B. Bonna Gains MD, MD 01/25/2021 9:37:05 AM This report has been signed electronically. Number of Addenda: 0 Note Initiated On: 01/25/2021 9:00 AM Estimated Blood Loss:  Estimated blood loss: none.      [facility]

## 2021-01-25 NOTE — Transfer of Care (Signed)
Immediate Anesthesia Transfer of Care Note  Patient: Mark Wyatt  Procedure(s) Performed: ESOPHAGOGASTRODUODENOSCOPY (EGD) WITH PROPOFOL (N/A )  Patient Location: Endoscopy Unit  Anesthesia Type:General  Level of Consciousness: drowsy  Airway & Oxygen Therapy: Patient Spontanous Breathing  Post-op Assessment: Report given to RN and Post -op Vital signs reviewed and stable  Post vital signs: Reviewed and stable  Last Vitals:  Vitals Value Taken Time  BP 94/52 01/25/21 0932  Temp    Pulse 59 01/25/21 0933  Resp 20 01/25/21 0933  SpO2 93 % 01/25/21 0933  Vitals shown include unvalidated device data.  Last Pain:  Vitals:   01/25/21 0834  TempSrc: Skin  PainSc: 0-No pain         Complications: No complications documented.

## 2021-01-26 ENCOUNTER — Encounter: Payer: Self-pay | Admitting: Gastroenterology

## 2021-01-30 LAB — SURGICAL PATHOLOGY

## 2021-02-06 ENCOUNTER — Encounter: Payer: Self-pay | Admitting: Hospice and Palliative Medicine

## 2021-02-06 ENCOUNTER — Other Ambulatory Visit: Payer: Self-pay

## 2021-02-06 ENCOUNTER — Ambulatory Visit (INDEPENDENT_AMBULATORY_CARE_PROVIDER_SITE_OTHER): Payer: Medicare Other | Admitting: Hospice and Palliative Medicine

## 2021-02-06 VITALS — BP 140/82 | HR 69 | Temp 98.5°F | Resp 16 | Ht 71.0 in | Wt 200.6 lb

## 2021-02-06 DIAGNOSIS — E782 Mixed hyperlipidemia: Secondary | ICD-10-CM | POA: Diagnosis not present

## 2021-02-06 DIAGNOSIS — R7303 Prediabetes: Secondary | ICD-10-CM | POA: Diagnosis not present

## 2021-02-06 DIAGNOSIS — K2980 Duodenitis without bleeding: Secondary | ICD-10-CM | POA: Diagnosis not present

## 2021-02-06 DIAGNOSIS — I10 Essential (primary) hypertension: Secondary | ICD-10-CM | POA: Diagnosis not present

## 2021-02-06 MED ORDER — FREESTYLE LITE TEST VI STRP: ORAL_STRIP | 1 refills | Status: DC

## 2021-02-06 MED ORDER — ATORVASTATIN CALCIUM 40 MG PO TABS: 40.0000 mg | ORAL_TABLET | Freq: Every day | ORAL | 1 refills | Status: DC

## 2021-02-06 NOTE — Progress Notes (Signed)
[facility] [place], [place] [address]  Internal MEDICINE  Office Visit Note  Patient Name: Mark Wyatt  [id number]  [id number]  Date of Service: 02/13/2021  Chief Complaint  Patient presents with  . Diabetes    4 month fup  . Hypertension  . Hyperlipidemia    HPI Patient is here for routine follow-up Starting to slowly recover from recent diagnosis of possible duodenitis Referred and evaluated by GI-has completed course of levofloxacin as well as metronidazole During acute illness he lost about 20 pounds, he is slowly regaining his appetite and weight is slowly beginning to increase Wife also explains that she feels he is slowly starting to regain his energy levels  At this time, he denies further abdominal pain, stools have returned to normal and denies blood in stool or dark tarry stools  Confusion regarding the status of "impaired fasting glucose" Reviewed documented A1C levels--higest A1C documented 6.2, explained this is consider "prediabetes" ultimately increasing his risk of T2DM Wife if concerned as she explains he has not received any education on what foods he should or should not be eating Has been on Metformin for about a years--at this time he is tolerating well Home glucose readings have remained well controlled  Current Medication: No facility-administered encounter medications on file as of 02/06/2021.   Outpatient Encounter Medications as of 02/06/2021  Medication Sig  . amLODipine (NORVASC) 2.5 MG tablet Take 1 tablet (2.5 mg total) by mouth daily.  Marland Kitchen aspirin 81 MG chewable tablet Chew 1 tablet (81 mg total) by mouth daily.  . Blood Glucose Monitoring Suppl (FREESTYLE LITE) w/Device KIT 1 each by Does not apply route daily. Use as directed  . Lancets 30G MISC Use as directed once a day DX impaired fasting glucose  . metFORMIN (GLUCOPHAGE-XR) 500 MG 24 hr tablet Take 1 tablet (500 mg total) by mouth daily with breakfast.  .  nitroGLYCERIN (NITROSTAT) 0.4 MG SL tablet Place 1 tablet (0.4 mg total) under the tongue every 5 (five) minutes as needed for chest pain.  . pantoprazole (PROTONIX) 40 MG tablet Take 1 tablet (40 mg total) by mouth 2 (two) times daily.  . tadalafil (CIALIS) 20 MG tablet 1 tab 1 hour prior to intercourse  . trospium (SANCTURA) 20 MG tablet TAKE 1 TABLET(20 MG) BY MOUTH TWICE DAILY  . [DISCONTINUED] atorvastatin (LIPITOR) 40 MG tablet Take 1 tablet (40 mg total) by mouth daily at 6 PM.  . [DISCONTINUED] empagliflozin (JARDIANCE) 10 MG TABS tablet Take 1 tablet (10 mg total) by mouth daily before breakfast.  . [DISCONTINUED] glucose blood (FREESTYLE LITE) test strip Use as instructed as needed once a day DX impaired fasting glucose  . [DISCONTINUED] predniSONE (DELTASONE) 20 MG tablet   . [DISCONTINUED] sildenafil (VIAGRA) 100 MG tablet   . [DISCONTINUED] tamsulosin (FLOMAX) 0.4 MG CAPS capsule   . atorvastatin (LIPITOR) 40 MG tablet Take 1 tablet (40 mg total) by mouth daily at 6 PM.  . glucose blood (FREESTYLE LITE) test strip Use as instructed as needed once a day DX impaired fasting glucose  . [DISCONTINUED] ciprofloxacin (CIPRO) 500 MG tablet Take 1 tablet (500 mg total) by mouth 2 (two) times daily. (Patient not taking: No sig reported)  . [DISCONTINUED] metoprolol tartrate (LOPRESSOR) 25 MG tablet Take 1 tablet (25 mg total) by mouth 2 (two) times daily.  . [DISCONTINUED] metroNIDAZOLE (FLAGYL) 500 MG tablet Take 1 tablet (500 mg total) by mouth 3 (three) times daily. (Patient not taking: Reported  on 02/06/2021)    Surgical History: Past Surgical History:  Procedure Laterality Date  . ANAL FISTULOTOMY    . CARDIAC CATHETERIZATION    . CORONARY STENT INTERVENTION N/A 02/24/2017   Procedure: Coronary Stent Intervention;  Surgeon: [name], MD;  Location: [place] [facility];  Service: Cardiovascular;  Laterality: N/A;  . ESOPHAGOGASTRODUODENOSCOPY N/A 02/10/2021   Procedure:  ESOPHAGOGASTRODUODENOSCOPY (EGD);  Surgeon: [name], MD;  Location: [facility] ENDOSCOPY;  Service: Gastroenterology;  Laterality: N/A;  . ESOPHAGOGASTRODUODENOSCOPY (EGD) WITH PROPOFOL N/A 01/25/2021   Procedure: ESOPHAGOGASTRODUODENOSCOPY (EGD) WITH PROPOFOL;  Surgeon: [name], MD;  Location: [facility] ENDOSCOPY;  Service: Endoscopy;  Laterality: N/A;  . LEFT HEART CATH AND CORONARY ANGIOGRAPHY N/A 02/24/2017   Procedure: Left Heart Cath and Coronary Angiography;  Surgeon: [name], MD;  Location: [place] [facility];  Service: Cardiovascular;  Laterality: N/A;    Medical History: Past Medical History:  Diagnosis Date  . Diabetes mellitus without complication ([place])   . Gout   . Hyperlipidemia   . Hypertension   . Myocardial infarction ([facility])    mild  . Overactive bladder    [facility]  . Trigger finger of all digits of right hand     Family History: Family History  Problem Relation Age of Onset  . Diabetes Mother     Social History   Socioeconomic History  . Marital status: Married    Spouse name: Not on file  . Number of children: Not on file  . Years of education: Not on file  . Highest education level: Not on file  Occupational History  . Not on file  Tobacco Use  . Smoking status: Former Research scientist (life sciences)  . Smokeless tobacco: Never Used  Vaping Use  . Vaping Use: Never used  Substance and Sexual Activity  . Alcohol use: Yes    Alcohol/week: 6.0 standard drinks    Types: 6 Cans of beer per week    Comment: 1 or 2 with dinner  . Drug use: No  . Sexual activity: Not on file  Other Topics Concern  . Not on file  Social History Narrative  . Not on file   Social Determinants of Health   Financial Resource Strain: Not on file  Food Insecurity: Not on file  Transportation Needs: Not on file  Physical Activity: Not on file  Stress: Not on file  Social Connections: Not on file  Intimate Partner Violence: Not on file      Review of Systems   Constitutional: Negative for chills, fatigue and unexpected weight change.  HENT: Negative for congestion, postnasal drip, rhinorrhea, sneezing and sore throat.   Eyes: Negative for redness.  Respiratory: Negative for cough, chest tightness and shortness of breath.   Cardiovascular: Negative for chest pain and palpitations.  Gastrointestinal: Negative for abdominal pain, constipation, diarrhea, nausea and vomiting.  Genitourinary: Negative for dysuria and frequency.  Musculoskeletal: Negative for arthralgias, back pain, joint swelling and neck pain.  Skin: Negative for rash.  Neurological: Negative for tremors and numbness.  Hematological: Negative for adenopathy. Does not bruise/bleed easily.  Psychiatric/Behavioral: Negative for behavioral problems (Depression), sleep disturbance and suicidal ideas. The patient is not nervous/anxious.     Vital Signs: BP 140/82   Pulse 69   Temp 98.5 F (36.9 C)   Resp 16   Ht '5\' 11"'  (1.803 m)   Wt 200 lb 9.6 oz (91 kg)   SpO2 99%   BMI 27.98 kg/m    Physical  Exam Vitals reviewed.  Constitutional:      Appearance: Normal appearance. He is normal weight.  Cardiovascular:     Rate and Rhythm: Normal rate and regular rhythm.     Pulses: Normal pulses.     Heart sounds: Normal heart sounds.  Pulmonary:     Effort: Pulmonary effort is normal.     Breath sounds: Normal breath sounds.  Abdominal:     General: Abdomen is flat.     Palpations: Abdomen is soft.     Tenderness: There is no abdominal tenderness.  Musculoskeletal:        General: Normal range of motion.     Cervical back: Normal range of motion.  Skin:    General: Skin is warm.  Neurological:     General: No focal deficit present.     Mental Status: He is alert and oriented to person, place, and time. Mental status is at baseline.  Psychiatric:        Mood and Affect: Mood normal.        Behavior: Behavior normal.        Thought Content: Thought content normal.         Judgment: Judgment normal.    Assessment/Plan: 1. Mixed hyperlipidemia Continue with statin therapy, tolerating well, requesting refills - atorvastatin (LIPITOR) 40 MG tablet; Take 1 tablet (40 mg total) by mouth daily at 6 PM.  Dispense: 90 tablet; Refill: 1  2. Duodenitis Slowly improving, appetite and energy levels returning, followed by GI  3. Prediabetes Requesting refills of test strips In depth discussion regarding A1C meaning and risk of developing T2DM We also discussed foods to consume in moderation--educational materials provided with AVS - glucose blood (FREESTYLE LITE) test strip; Use as instructed as needed once a day DX impaired fasting glucose  Dispense: 100 each; Refill: 1  4. Essential hypertension BP and HR remain well controlled on present management, continue to monitor  General Counseling: Wilmore verbalizes understanding of the findings of todays visit and agrees with plan of treatment. I have discussed any further diagnostic evaluation that may be needed or ordered today. We also reviewed his medications today. he has been encouraged to call the office with any questions or concerns that should arise related to todays visit.  Meds ordered this encounter  Medications  . glucose blood (FREESTYLE LITE) test strip    Sig: Use as instructed as needed once a day DX impaired fasting glucose    Dispense:  100 each    Refill:  1  . atorvastatin (LIPITOR) 40 MG tablet    Sig: Take 1 tablet (40 mg total) by mouth daily at 6 PM.    Dispense:  90 tablet    Refill:  1    Time spent: 30 Minutes Time spent includes review of chart, medications, test results and follow-up plan with the patient.  This patient was seen by Theodoro Grist AGNP-C in Collaboration with Dr Lavera Guise as a part of collaborative care agreement     Tanna Furry. Cavion Faiola AGNP-C Internal medicine

## 2021-02-06 NOTE — Patient Instructions (Signed)

## 2021-02-07 ENCOUNTER — Telehealth: Payer: Self-pay | Admitting: Gastroenterology

## 2021-02-07 NOTE — Telephone Encounter (Signed)
Patient would like a call back.  Had 4 large BM with blood during the night.  Stools are black. Nausea and sweats during BM.

## 2021-02-08 ENCOUNTER — Encounter: Payer: Self-pay | Admitting: Hospice and Palliative Medicine

## 2021-02-08 NOTE — Telephone Encounter (Signed)
Patient was called back and I was not able to leave him a voicemail since it's not set-up.

## 2021-02-09 ENCOUNTER — Other Ambulatory Visit: Payer: Self-pay

## 2021-02-09 ENCOUNTER — Inpatient Hospital Stay
Admission: EM | Admit: 2021-02-09 | Discharge: 2021-02-13 | DRG: 378 | Disposition: A | Discharge status: Home / Self Care | Payer: Medicare Other | Attending: Internal Medicine | Admitting: Internal Medicine

## 2021-02-09 ENCOUNTER — Emergency Department: Payer: Medicare Other

## 2021-02-09 ENCOUNTER — Other Ambulatory Visit: Payer: Self-pay | Admitting: Hospice and Palliative Medicine

## 2021-02-09 ENCOUNTER — Other Ambulatory Visit
Admission: RE | Admit: 2021-02-09 | Discharge: 2021-02-09 | Disposition: A | Discharge status: Home / Self Care | Payer: Medicare Other | Source: Ambulatory Visit | Attending: Gastroenterology | Admitting: Gastroenterology

## 2021-02-09 ENCOUNTER — Telehealth: Payer: Self-pay

## 2021-02-09 DIAGNOSIS — E1169 Type 2 diabetes mellitus with other specified complication: Secondary | ICD-10-CM | POA: Diagnosis present

## 2021-02-09 DIAGNOSIS — E119 Type 2 diabetes mellitus without complications: Secondary | ICD-10-CM | POA: Diagnosis present

## 2021-02-09 DIAGNOSIS — K219 Gastro-esophageal reflux disease without esophagitis: Secondary | ICD-10-CM | POA: Diagnosis not present

## 2021-02-09 DIAGNOSIS — R008 Other abnormalities of heart beat: Secondary | ICD-10-CM | POA: Diagnosis present

## 2021-02-09 DIAGNOSIS — Z7982 Long term (current) use of aspirin: Secondary | ICD-10-CM

## 2021-02-09 DIAGNOSIS — D649 Anemia, unspecified: Secondary | ICD-10-CM | POA: Diagnosis present

## 2021-02-09 DIAGNOSIS — M109 Gout, unspecified: Secondary | ICD-10-CM | POA: Diagnosis present

## 2021-02-09 DIAGNOSIS — D62 Acute posthemorrhagic anemia: Secondary | ICD-10-CM | POA: Diagnosis present

## 2021-02-09 DIAGNOSIS — K921 Melena: Principal | ICD-10-CM | POA: Diagnosis present

## 2021-02-09 DIAGNOSIS — Z87891 Personal history of nicotine dependence: Secondary | ICD-10-CM | POA: Diagnosis not present

## 2021-02-09 DIAGNOSIS — K2980 Duodenitis without bleeding: Secondary | ICD-10-CM | POA: Diagnosis present

## 2021-02-09 DIAGNOSIS — Z886 Allergy status to analgesic agent status: Secondary | ICD-10-CM | POA: Diagnosis not present

## 2021-02-09 DIAGNOSIS — I2510 Atherosclerotic heart disease of native coronary artery without angina pectoris: Secondary | ICD-10-CM | POA: Diagnosis present

## 2021-02-09 DIAGNOSIS — E785 Hyperlipidemia, unspecified: Secondary | ICD-10-CM | POA: Diagnosis present

## 2021-02-09 DIAGNOSIS — Z20822 Contact with and (suspected) exposure to covid-19: Secondary | ICD-10-CM | POA: Diagnosis present

## 2021-02-09 DIAGNOSIS — T39015A Adverse effect of aspirin, initial encounter: Secondary | ICD-10-CM | POA: Diagnosis present

## 2021-02-09 DIAGNOSIS — K31819 Angiodysplasia of stomach and duodenum without bleeding: Secondary | ICD-10-CM | POA: Diagnosis present

## 2021-02-09 DIAGNOSIS — Z833 Family history of diabetes mellitus: Secondary | ICD-10-CM

## 2021-02-09 DIAGNOSIS — K625 Hemorrhage of anus and rectum: Secondary | ICD-10-CM | POA: Diagnosis present

## 2021-02-09 DIAGNOSIS — Z880 Allergy status to penicillin: Secondary | ICD-10-CM

## 2021-02-09 DIAGNOSIS — K2100 Gastro-esophageal reflux disease with esophagitis, without bleeding: Secondary | ICD-10-CM | POA: Diagnosis present

## 2021-02-09 DIAGNOSIS — Z7984 Long term (current) use of oral hypoglycemic drugs: Secondary | ICD-10-CM | POA: Diagnosis not present

## 2021-02-09 DIAGNOSIS — E782 Mixed hyperlipidemia: Secondary | ICD-10-CM | POA: Diagnosis present

## 2021-02-09 DIAGNOSIS — N3281 Overactive bladder: Secondary | ICD-10-CM | POA: Diagnosis present

## 2021-02-09 DIAGNOSIS — I10 Essential (primary) hypertension: Secondary | ICD-10-CM | POA: Diagnosis present

## 2021-02-09 DIAGNOSIS — I252 Old myocardial infarction: Secondary | ICD-10-CM

## 2021-02-09 DIAGNOSIS — Z79899 Other long term (current) drug therapy: Secondary | ICD-10-CM

## 2021-02-09 DIAGNOSIS — I498 Other specified cardiac arrhythmias: Secondary | ICD-10-CM | POA: Diagnosis present

## 2021-02-09 DIAGNOSIS — I152 Hypertension secondary to endocrine disorders: Secondary | ICD-10-CM | POA: Diagnosis present

## 2021-02-09 DIAGNOSIS — K317 Polyp of stomach and duodenum: Secondary | ICD-10-CM | POA: Diagnosis present

## 2021-02-09 LAB — CBC
HCT: 24.6 % — ABNORMAL LOW (ref 39.0–52.0)
HCT: 24.4 % — ABNORMAL LOW (ref 39.0–52.0)
HCT: 24.5 % — ABNORMAL LOW (ref 39.0–52.0)
Hemoglobin: 8.2 g/dL — ABNORMAL LOW (ref 13.0–17.0)
Hemoglobin: 8.4 g/dL — ABNORMAL LOW (ref 13.0–17.0)
Hemoglobin: 8.2 g/dL — ABNORMAL LOW (ref 13.0–17.0)
MCH: 28.4 pg (ref 26.0–34.0)
MCH: 28.6 pg (ref 26.0–34.0)
MCH: 28.4 pg (ref 26.0–34.0)
MCHC: 33.3 g/dL (ref 30.0–36.0)
MCHC: 34.4 g/dL (ref 30.0–36.0)
MCHC: 33.5 g/dL (ref 30.0–36.0)
MCV: 85.1 fL (ref 80.0–100.0)
MCV: 83 fL (ref 80.0–100.0)
MCV: 84.8 fL (ref 80.0–100.0)
Platelets: 185 10*3/uL (ref 150–400)
Platelets: 172 10*3/uL (ref 150–400)
Platelets: 155 10*3/uL (ref 150–400)
RBC: 2.89 MIL/uL — ABNORMAL LOW (ref 4.22–5.81)
RBC: 2.94 MIL/uL — ABNORMAL LOW (ref 4.22–5.81)
RBC: 2.89 MIL/uL — ABNORMAL LOW (ref 4.22–5.81)
RDW: 14 % (ref 11.5–15.5)
RDW: 14.6 % (ref 11.5–15.5)
RDW: 14.7 % (ref 11.5–15.5)
WBC: 10.3 10*3/uL (ref 4.0–10.5)
WBC: 8.4 10*3/uL (ref 4.0–10.5)
WBC: 7.7 10*3/uL (ref 4.0–10.5)
nRBC: 0 % (ref 0.0–0.2)
nRBC: 0 % (ref 0.0–0.2)
nRBC: 0 % (ref 0.0–0.2)

## 2021-02-09 LAB — HEMOGLOBIN: Hemoglobin: 8.8 g/dL — ABNORMAL LOW (ref 13.0–17.0)

## 2021-02-09 LAB — RESP PANEL BY RT-PCR (FLU A&B, COVID) ARPGX2
Influenza A by PCR: NEGATIVE
Influenza B by PCR: NEGATIVE
SARS Coronavirus 2 by RT PCR: NEGATIVE

## 2021-02-09 LAB — TROPONIN I (HIGH SENSITIVITY)
Troponin I (High Sensitivity): 9 ng/L (ref <18)
Troponin I (High Sensitivity): 8 ng/L (ref <18)

## 2021-02-09 LAB — ABO/RH: ABO/RH(D): A POS

## 2021-02-09 LAB — COMPREHENSIVE METABOLIC PANEL
ALT: 16 U/L (ref 0–44)
AST: 18 U/L (ref 15–41)
Albumin: 3.3 g/dL — ABNORMAL LOW (ref 3.5–5.0)
Alkaline Phosphatase: 61 U/L (ref 38–126)
Anion gap: 8 (ref 5–15)
BUN: 36 mg/dL — ABNORMAL HIGH (ref 8–23)
CO2: 24 mmol/L (ref 22–32)
Calcium: 8.6 mg/dL — ABNORMAL LOW (ref 8.9–10.3)
Chloride: 106 mmol/L (ref 98–111)
Creatinine, Ser: 0.97 mg/dL (ref 0.61–1.24)
GFR, Estimated: >60 mL/min (ref >60)
Glucose, Bld: 133 mg/dL — ABNORMAL HIGH (ref 70–99)
Potassium: 4.2 mmol/L (ref 3.5–5.1)
Sodium: 138 mmol/L (ref 135–145)
Total Bilirubin: 0.5 mg/dL (ref 0.3–1.2)
Total Protein: 6.1 g/dL — ABNORMAL LOW (ref 6.5–8.1)

## 2021-02-09 LAB — APTT: aPTT: 28 seconds (ref 24–36)

## 2021-02-09 LAB — BRAIN NATRIURETIC PEPTIDE: B Natriuretic Peptide: 56.2 pg/mL (ref 0.0–100.0)

## 2021-02-09 LAB — PROTIME-INR
INR: 1 (ref 0.8–1.2)
Prothrombin Time: 13.6 seconds (ref 11.4–15.2)

## 2021-02-09 LAB — GLUCOSE, CAPILLARY
Glucose-Capillary: 86 mg/dL (ref 70–99)
Glucose-Capillary: 99 mg/dL (ref 70–99)

## 2021-02-09 MED ORDER — PANTOPRAZOLE SODIUM 40 MG IV SOLR
40.0000 mg | Freq: Two times a day (BID) | INTRAVENOUS | Status: DC
  Administered 2021-02-12 – 2021-02-13 (×2): 40 mg via INTRAVENOUS
  Filled 2021-02-09 (×2): qty 40

## 2021-02-09 MED ORDER — ONDANSETRON HCL 4 MG/2ML IJ SOLN: 4.0000 mg | Freq: Three times a day (TID) | INTRAMUSCULAR | Status: DC | PRN

## 2021-02-09 MED ORDER — SODIUM CHLORIDE 0.9 % IV SOLN
10.0000 mL/h | Freq: Once | INTRAVENOUS | Status: AC
  Administered 2021-02-09: 10 mL/h via INTRAVENOUS

## 2021-02-09 MED ORDER — SODIUM CHLORIDE 0.9 % IV SOLN
80.0000 mg | Freq: Once | INTRAVENOUS | Status: AC
  Administered 2021-02-09: 80 mg via INTRAVENOUS
  Filled 2021-02-09: qty 80

## 2021-02-09 MED ORDER — SODIUM CHLORIDE 0.9 % IV SOLN
8.0000 mg/h | INTRAVENOUS | Status: AC
  Administered 2021-02-09 – 2021-02-12 (×6): 8 mg/h via INTRAVENOUS
  Filled 2021-02-09 (×6): qty 80

## 2021-02-09 MED ORDER — INSULIN ASPART 100 UNIT/ML ~~LOC~~ SOLN: 0.0000 [IU] | Freq: Every day | SUBCUTANEOUS | Status: DC

## 2021-02-09 MED ORDER — INSULIN ASPART 100 UNIT/ML ~~LOC~~ SOLN
0.0000 [IU] | Freq: Three times a day (TID) | SUBCUTANEOUS | Status: DC
  Administered 2021-02-10 – 2021-02-11 (×3): 1 [IU] via SUBCUTANEOUS
  Filled 2021-02-09 (×3): qty 1

## 2021-02-09 MED ORDER — SODIUM CHLORIDE 0.9 % IV BOLUS
500.0000 mL | Freq: Once | INTRAVENOUS | Status: AC
  Administered 2021-02-09: 500 mL via INTRAVENOUS

## 2021-02-09 MED ORDER — HYDRALAZINE HCL 20 MG/ML IJ SOLN: 5.0000 mg | INTRAMUSCULAR | Status: DC | PRN

## 2021-02-09 MED ORDER — ACETAMINOPHEN 325 MG PO TABS
650.0000 mg | ORAL_TABLET | Freq: Four times a day (QID) | ORAL | Status: DC | PRN
  Administered 2021-02-11: 06:00:00 650 mg via ORAL
  Filled 2021-02-09: qty 2

## 2021-02-09 MED ORDER — IOHEXOL 350 MG/ML SOLN
100.0000 mL | Freq: Once | INTRAVENOUS | Status: AC | PRN
  Administered 2021-02-09: 100 mL via INTRAVENOUS

## 2021-02-09 MED ORDER — SODIUM CHLORIDE 0.9 % IV SOLN: INTRAVENOUS | Status: DC

## 2021-02-09 NOTE — ED Provider Notes (Signed)
[facility] Emergency Department Provider Note  ____________________________________________   Event Date/Time   First MD Initiated Contact with Patient 02/09/21 1209     (approximate)  I have reviewed the triage vital signs and the nursing notes.   HISTORY  Chief Complaint Rectal Bleeding    HPI Mark Wyatt is a 70 y.o. male with diabetes, hypertension, hyperlipidemia who comes in for rectal bleeding.  Patient was seen by GI and had endoscopy done with some polyp removals on 4/7.  Denies any issues since then until 3 days ago he developed bloody bowel movements that were moderate, intermittent, nothing made the better, nothing made them worse.  He does report a little bit of left lower quadrant abdominal pain that is intermittent but none currently.  He states that he has felt more fatigue and dizzy.  His wife is a Marine scientist and wanted him to come to the ER but he was resistant.  They ended up getting a hemoglobin checked in the office today.  They were not told what it was but were told that it had dropped and they needed to come to the ER to be admitted.  Patient does report some intermittent dizziness as well.  Denies any at this time.            Past Medical History:  Diagnosis Date  . Diabetes mellitus without complication ([place])   . Gout   . Hyperlipidemia   . Hypertension   . Myocardial infarction (HCC)    mild  . Overactive bladder    [facility]  . Trigger finger of all digits of right hand     Patient Active Problem List   Diagnosis Date Noted  . Other dysphagia   . Duodenitis   . AVM (arteriovenous malformation) of duodenum, acquired   . Gastric polyp   . Esophagitis   . Hypertension, essential 09/19/2020  . NSTEMI (non-ST elevated myocardial infarction) ([place]) 02/22/2017  . History of non-ST elevation myocardial infarction (NSTEMI) 02/22/2017  . Chest pain due to CAD ([place]) 02/21/2017  . Coronary artery disease involving  native coronary artery of native heart without angina pectoris 02/21/2017  . 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 Perirectal abscess 02/05/2012    Past Surgical History:  Procedure Laterality Date  . ANAL FISTULOTOMY    . CARDIAC CATHETERIZATION    . CORONARY STENT INTERVENTION N/A 02/24/2017   Procedure: Coronary Stent Intervention;  Surgeon: [name], MD;  Location: [place] [facility];  Service: Cardiovascular;  Laterality: N/A;  . ESOPHAGOGASTRODUODENOSCOPY (EGD) WITH PROPOFOL N/A 01/25/2021   Procedure: ESOPHAGOGASTRODUODENOSCOPY (EGD) WITH PROPOFOL;  Surgeon: [name], MD;  Location: [facility] ENDOSCOPY;  Service: Endoscopy;  Laterality: N/A;  . LEFT HEART CATH AND CORONARY ANGIOGRAPHY N/A 02/24/2017   Procedure: Left Heart Cath and Coronary Angiography;  Surgeon: [name], MD;  Location: [place] [facility];  Service: Cardiovascular;  Laterality: N/A;    Prior to Admission medications   Medication Sig Start Date End Date Taking? Authorizing Provider  amLODipine (NORVASC) 2.5 MG tablet Take 1 tablet (2.5 mg total) by mouth daily. 09/25/20   Lavera Guise, MD  aspirin 81 MG chewable tablet Chew 1 tablet (81 mg total) by mouth daily. 02/25/17   Bettey Costa, MD  atorvastatin (LIPITOR) 40 MG tablet Take 1 tablet (40 mg total) by mouth daily at 6 PM. 02/06/21   Luiz Ochoa, NP  Blood Glucose Monitoring Suppl (FREESTYLE LITE) w/Device KIT 1 each by Does  not apply route daily. Use as directed 09/08/20   Lavera Guise, MD  glucose blood (FREESTYLE LITE) test strip Use as instructed as needed once a day DX impaired fasting glucose 02/06/21   Luiz Ochoa, NP  Lancets 30G MISC Use as directed once a day DX impaired fasting glucose 09/08/20   Lavera Guise, MD  metFORMIN (GLUCOPHAGE-XR) 500 MG 24 hr tablet Take 1 tablet (500 mg total) by mouth daily with breakfast. 10/09/20   Luiz Ochoa, NP  nitroGLYCERIN (NITROSTAT) 0.4 MG SL tablet Place 1 tablet (0.4 mg total) under the  tongue every 5 (five) minutes as needed for chest pain. 02/25/17   Bettey Costa, MD  pantoprazole (PROTONIX) 40 MG tablet Take 1 tablet (40 mg total) by mouth 2 (two) times daily. 01/25/21 02/24/21  [name], MD  tadalafil (CIALIS) 20 MG tablet 1 tab 1 hour prior to intercourse 10/06/20   Stoioff, Ronda Fairly, MD  trospium (SANCTURA) 20 MG tablet TAKE 1 TABLET(20 MG) BY MOUTH TWICE DAILY 10/23/20   Vaillancourt, Aldona Bar, PA-C  metoprolol tartrate (LOPRESSOR) 25 MG tablet Take 1 tablet (25 mg total) by mouth 2 (two) times daily. 02/25/17 09/06/19  Bettey Costa, MD    Allergies Penicillins and Aspirin  Family History  Problem Relation Age of Onset  . Diabetes Mother     Social History Social History   Tobacco Use  . Smoking status: Former Research scientist (life sciences)  . Smokeless tobacco: Never Used  Vaping Use  . Vaping Use: Never used  Substance Use Topics  . Alcohol use: Yes    Alcohol/week: 6.0 standard drinks    Types: 6 Cans of beer per week    Comment: 1 or 2 with dinner  . Drug use: No      Review of Systems Constitutional: No fever/chills Eyes: No visual changes. ENT: No sore throat. Cardiovascular: Denies chest pain.  Positive dizziness Respiratory: Denies shortness of breath. Gastrointestinal: Occasional left lower quadrant pain no nausea, no vomiting.  No diarrhea.  No constipation.Rectal bleeding Genitourinary: Negative for dysuria.   Musculoskeletal: Negative for back pain. Skin: Negative for rash. Neurological: Negative for headaches, focal weakness or numbness. All other ROS negative ____________________________________________   PHYSICAL EXAM:  VITAL SIGNS: ED Triage Vitals  Enc Vitals Group     BP 02/09/21 1210 138/69     Pulse Rate 02/09/21 1208 (!) 101     Resp 02/09/21 1208 20     Temp 02/09/21 1208 98.4 F (36.9 C)     Temp Source 02/09/21 1208 Oral     SpO2 02/09/21 1208 98 %     Weight 02/09/21 1205 184 lb (83.5 kg)     Height 02/09/21 1205 '5\' 11"'  (1.803 m)      Head Circumference --      Peak Flow --      Pain Score 02/09/21 1205 0     Pain Loc --      Pain Edu? --      Excl. in [place]? --     Constitutional: Alert and oriented. Well appearing and in no acute distress.  Does appear pale Eyes: Conjunctivae are normal. EOMI. Head: Atraumatic. Nose: No congestion/rhinnorhea. Mouth/Throat: Mucous membranes are moist.   Neck: No stridor. Trachea Midline. FROM Cardiovascular: Tachycardic regular rhythm. Grossly normal heart sounds.  Good peripheral circulation. Respiratory: Normal respiratory effort.  No retractions. Lungs CTAB. Gastrointestinal: Soft and nontender. No distention. No abdominal bruits.  Musculoskeletal: No lower extremity tenderness nor edema.  No joint effusions. Neurologic:  Normal speech and language. No gross focal neurologic deficits are appreciated.  Skin:  Skin is warm, dry and intact. No rash noted. Psychiatric: Mood and affect are normal. Speech and behavior are normal. GU: Gross bright red blood  ____________________________________________   LABS (all labs ordered are listed, but only abnormal results are displayed)  Labs Reviewed  CBC - Abnormal; Notable for the following components:      Result Value   RBC 2.89 (*)    Hemoglobin 8.2 (*)    HCT 24.6 (*)    All other components within normal limits  COMPREHENSIVE METABOLIC PANEL  PROTIME-INR  APTT  POC OCCULT BLOOD, ED  TYPE AND SCREEN  PREPARE RBC (CROSSMATCH)   ____________________________________________   ED ECG REPORT I, Vanessa [place], the attending physician, personally viewed and interpreted this ECG.  Sinus tachycardia rate of 117 with ventricular bigeminy, no ST elevation, no T wave inversions ____________________________________________  RADIOLOGY   Official radiology report(s): CT Angio Abd/Pel W and/or Wo Contrast  Result Date: 02/09/2021 CLINICAL DATA:  70 year old with rectal bleeding. EXAM: CTA ABDOMEN AND PELVIS WITHOUT AND WITH  CONTRAST TECHNIQUE: Multidetector CT imaging of the abdomen and pelvis was performed using the standard protocol during bolus administration of intravenous contrast. Multiplanar reconstructed images and MIPs were obtained and reviewed to evaluate the vascular anatomy. CONTRAST:  172m OMNIPAQUE IOHEXOL 350 MG/ML SOLN COMPARISON:  08/27/2019 FINDINGS: VASCULAR Aorta: Atherosclerotic disease in the abdominal aorta without aneurysm, dissection or significant stenosis. Celiac: Minimal narrowing at the origin of the celiac trunk. Main branch vessels are patent. Negative for aneurysm or dissection. SMA: Patent without evidence of aneurysm, dissection, vasculitis or significant stenosis. Renals: Single right renal artery is patent with atherosclerotic disease but no significant stenosis. Main left renal artery is patent without significant stenosis. There are 3 small accessory left renal arteries. IMA: Patent without evidence of aneurysm, dissection, vasculitis or significant stenosis. Inflow: Atherosclerotic disease without aneurysm, dissection or significant stenosis. Common, internal external iliac arteries are patent bilaterally. Proximal Outflow: Proximal femoral arteries are patent bilaterally. Veins: Hepatic veins are patent. Portal venous system is patent. Normal appearance of the IVC and renal veins. Review of the MIP images confirms the above findings. NON-VASCULAR Lower chest: Lung bases are clear without pleural effusions. Coronary artery calcifications. Hepatobiliary: Normal appearance of the liver and gallbladder. No suspicious hepatic lesions. No biliary dilatation. Pancreas: Unremarkable. No pancreatic ductal dilatation or surrounding inflammatory changes. Spleen: Normal in size without focal abnormality. Adrenals/Urinary Tract: Normal appearance of the adrenal glands. There is a large exophytic cyst adjacent to the right adrenal gland. Bilateral renal cysts. No hydronephrosis. No suspicious renal lesions.  Normal appearance of the urinary bladder. No kidney stones. Stomach/Bowel: Stomach is within normal limits. No evidence of bowel wall thickening, distention, or inflammatory changes. No evidence for intraluminal contrast or active GI bleeding. Lymphatic: No lymph node enlargement in the abdomen or pelvis. Reproductive: Enlarged prostate. Other: Negative for ascites. Musculoskeletal: No acute bone abnormality. IMPRESSION: VASCULAR 1. No active GI bleeding. 2. Aortic Atherosclerosis (ICD10-I70.0). Coronary artery calcifications. NON-VASCULAR 1. No acute abnormality in the abdomen or pelvis. 2. Bilateral renal cysts. 3. Prostate enlargement. Electronically Signed   By: AMarkus DaftM.D.   On: 02/09/2021 14:47    ____________________________________________   PROCEDURES  Procedure(s) performed (including Critical Care):  .1-3 Lead EKG Interpretation Performed by: FVanessa [place] MD Authorized by: FVanessa [place] MD     Interpretation: abnormal  ECG rate:  100s   ECG rate assessment: tachycardic     Rhythm: sinus tachycardia     Ectopy: bigeminy     Conduction: normal    .Critical Care Performed by: Vanessa [place], MD Authorized by: Vanessa [place], MD   Critical care provider statement:    Critical care time (minutes):  45   Critical care was necessary to treat or prevent imminent or life-threatening deterioration of the following conditions: Gi bleed    Critical care was time spent personally by me on the following activities:  Discussions with consultants, evaluation of patient's response to treatment, examination of patient, ordering and performing treatments and interventions, ordering and review of laboratory studies, ordering and review of radiographic studies, pulse oximetry, re-evaluation of patient's condition, obtaining history from patient or surrogate and review of old charts     ____________________________________________   INITIAL IMPRESSION / [place] / ED  Falls was evaluated in Emergency Department on 02/09/2021 for the symptoms described in the history of present illness. He was evaluated in the context of the global COVID-19 pandemic, which necessitated consideration that the patient might be at risk for infection with the SARS-CoV-2 virus that causes COVID-19. Institutional protocols and algorithms that pertain to the evaluation of patients at risk for COVID-19 are in a state of rapid change based on information released by regulatory bodies including the CDC and federal and state organizations. These policies and algorithms were followed during the patient's care in the ED.    Patient is a 70 year old who comes in with rectal bleeding with gross blood on examination.  Patient reports feeling dizzy and his EKG shows bigeminy.  Patient was placed in the cardiac monitor.  When patient feels dizzy he has runs of bigeminy and when he is not dizzy the bigeminy is gone.  We will get electrolytes, kidney function.  Will need CT angio to rule out active bleeding that could get vascular involvement.  GI has been consulted and Dr. Bonna Gains has been to patient's bedside.  Patient will require admission for rectal bleeding.  Hemoglobin has significantly dropped to 8.  Given patient's symptoms with bigeminy we will give patient 1 unit of blood and prep 2 units.  We will give 500 cc of fluid while awaiting blood.  We will make sure we have 2 large-bore IVs in case patient decompensates.  ET scan was negative for active bleed  Patient will be admitted to the hospital         ____________________________________________   FINAL CLINICAL IMPRESSION(S) / ED DIAGNOSES   Final diagnoses:  Rectal bleeding      MEDICATIONS GIVEN DURING THIS VISIT:  Medications  pantoprazole (PROTONIX) 80 mg in sodium chloride 0.9 % 100 mL (0.8 mg/mL) infusion (8 mg/hr Intravenous New Bag/Given 02/09/21 1423)  pantoprazole (PROTONIX) injection 40 mg (has no  administration in time range)  insulin aspart (novoLOG) injection 0-9 Units (has no administration in time range)  insulin aspart (novoLOG) injection 0-5 Units (has no administration in time range)  ondansetron (ZOFRAN) injection 4 mg (has no administration in time range)  acetaminophen (TYLENOL) tablet 650 mg (has no administration in time range)  hydrALAZINE (APRESOLINE) injection 5 mg (has no administration in time range)  0.9 %  sodium chloride infusion (has no administration in time range)  pantoprazole (PROTONIX) 80 mg in sodium chloride 0.9 % 100 mL IVPB (0 mg Intravenous Stopped 02/09/21 1347)  0.9 %  sodium chloride infusion (10 mL/hr  Intravenous New Bag/Given 02/09/21 1410)  sodium chloride 0.9 % bolus 500 mL (500 mLs Intravenous New Bag/Given 02/09/21 1305)  iohexol (OMNIPAQUE) 350 MG/ML injection 100 mL (100 mLs Intravenous Contrast Given 02/09/21 1332)     ED Discharge Orders    None       Note:  This document was prepared using Dragon voice recognition software and may include unintentional dictation errors.   Vanessa , MD 02/09/21 [phone]

## 2021-02-09 NOTE — ED Triage Notes (Signed)
Pt to ed pov for rectal bleeding x3 days. Recently had egd and had inflammation and polyps in esophagus.  Pt reports bright red and clots with bm.  Pt in nad.  Skin color wdl.  + dizziness

## 2021-02-09 NOTE — Consult Note (Addendum)
Vonda Antigua, MD [address] [place], [place], [place], [address] [address], [place], [place], [place], [address] Phone: [phone]  Fax: [phone]  Consultation  Referring Provider:     Dr. Jari Pigg Primary Care Physician:  Luiz Ochoa, NP Reason for Consultation:     GI bleed  Date of Admission:  02/09/2021 Date of Consultation:  02/09/2021         HPI:   Mark Wyatt is a 70 y.o. male with 24-monthhistory of abdominal pain, who presents with 2-day history of what started as melanotic stool, now with dark red blood per rectum.  No hematemesis.  No emesis. wife states, patient did not want to go to the ER when the symptoms first started 2 days ago.    The first time I was informed about this patient's symptoms was at 12 PM today.  Prior to this, no one informed me from the clinic, the patient, or any other staff about the patient's symptoms.  As soon as I was informed about the patient's ongoing symptoms, I advised him to go to the ER and came down to see him myself as well.  Patient presents with his wife, and has been on aspirin daily at home.  Patient has been reporting abdominal pain since March 2022 after being started on prednisone and colchicine for gout.  Upper GI series done for abdominal pain showed duodenitis.  Patient underwent upper endoscopy 2 weeks ago that showed grade B esophagitis, gastric polyp that was biopsied, nodular mucosa in the duodenal bulb that was biopsied, duodenal erythema, and an AVM that was treated. Pathology showed nonspecific duodenitis, gastric heterotropia, fundic gland gastric polyp. Pt did not have any episodes of bleeding post procedure and was asymptomatic until 2 days ago when symptoms started.    Past Medical History:  Diagnosis Date  . Diabetes mellitus without complication ([place]   . Gout   . Hyperlipidemia   . Hypertension   . Myocardial infarction (HCC)    mild  . Overactive bladder    [facility]  .  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 ANAL FISTULOTOMY    . CARDIAC CATHETERIZATION    . CORONARY STENT INTERVENTION N/A 02/24/2017   Procedure: Coronary Stent Intervention;  Surgeon: CYolonda Kida MD;  [facility];  Service: Cardiovascular;  Laterality: N/A;  . ESOPHAGOGASTRODUODENOSCOPY (EGD) WITH PROPOFOL N/A 01/25/2021   Procedure: ESOPHAGOGASTRODUODENOSCOPY (EGD) WITH PROPOFOL;  Surgeon: TVirgel Manifold MD;  Location: [facility]OSCOPY;  Service: Endoscopy;  Laterality: N/A;  . LEFT HEART CATH AND CORONARY ANGIOGRAPHY N/A 02/24/2017   Procedure: Left Heart Cath and Coronary Angiography;  Surgeon: FTeodoro Spray MD;  [facility];  Service: Cardiovascular;  Laterality: N/A;    Prior to Admission medications   Medication Sig Start Date End Date Taking? Authorizing Provider  amLODipine (NORVASC) 2.5 MG tablet Take 1 tablet (2.5 mg total) by mouth daily. 09/25/20   KLavera Guise MD  aspirin 81 MG chewable tablet Chew 1 tablet (81 mg total) by mouth daily. 02/25/17   MBettey Costa MD  atorvastatin (LIPITOR) 40 MG tablet Take 1 tablet (40 mg total) by mouth daily at 6 PM. 02/06/21   HLuiz Ochoa NP  Blood Glucose Monitoring Suppl (FREESTYLE LITE) w/Device KIT 1 each by Does not apply route daily. Use as directed 09/08/20   KClayborn Bigness  M, MD  glucose blood (FREESTYLE LITE) test strip Use as instructed as needed once a day DX impaired fasting glucose 02/06/21   Luiz Ochoa, NP  Lancets 30G MISC Use as directed once a day DX impaired fasting glucose 09/08/20   Lavera Guise, MD  metFORMIN (GLUCOPHAGE-XR) 500 MG 24 hr tablet Take 1 tablet (500 mg total) by mouth daily with breakfast. 10/09/20   Luiz Ochoa, NP  nitroGLYCERIN (NITROSTAT) 0.4 MG SL tablet Place 1 tablet (0.4 mg total) under the tongue every 5 (five) minutes as needed for chest pain. 02/25/17   Bettey Costa, MD   pantoprazole (PROTONIX) 40 MG tablet Take 1 tablet (40 mg total) by mouth 2 (two) times daily. 01/25/21 02/24/21  [name], MD  tadalafil (CIALIS) 20 MG tablet 1 tab 1 hour prior to intercourse 10/06/20   Stoioff, Ronda Fairly, MD  trospium (SANCTURA) 20 MG tablet TAKE 1 TABLET(20 MG) BY MOUTH TWICE DAILY 10/23/20   Vaillancourt, Aldona Bar, PA-C  metoprolol tartrate (LOPRESSOR) 25 MG tablet Take 1 tablet (25 mg total) by mouth 2 (two) times daily. 02/25/17 09/06/19  Bettey Costa, MD    Family History  Problem Relation Age of Onset  . Diabetes Mother      Social History   Tobacco Use  . Smoking status: Former Research scientist (life sciences)  . Smokeless tobacco: Never Used  Vaping Use  . Vaping Use: Never used  Substance Use Topics  . Alcohol use: Yes    Alcohol/week: 6.0 standard drinks    Types: 6 Cans of beer per week    Comment: 1 or 2 with dinner  . Drug use: No    Allergies as of 02/09/2021 - Review Complete 02/09/2021  Allergen Reaction Noted  . Penicillins Other (See Comments) 01/16/2011  . Aspirin Other (See Comments) 02/05/2012    Review of Systems:    All systems reviewed and negative except where noted in HPI.   Physical Exam:  Vital signs in last 24 hours: Vitals:   02/09/21 1205 02/09/21 1208 02/09/21 1210  BP:   138/69  Pulse:  (!) 101 99  Resp:  20 18  Temp:  98.4 F (36.9 C)   TempSrc:  Oral   SpO2:  98% 100%  Weight: 83.5 kg    Height: _0  (1.803 m)       General:   Pleasant, cooperative in NAD Head:  Normocephalic and atraumatic. Eyes:   No icterus.   Conjunctiva pink. PERRLA. Ears:  Normal auditory acuity. Neck:  Supple; no masses or thyroidomegaly Lungs: Respirations even and unlabored. Lungs clear to auscultation bilaterally.   No wheezes, crackles, or rhonchi.  Abdomen:  Soft, nondistended, nontender. Normal bowel sounds. No appreciable masses or hepatomegaly.  No rebound or guarding.  Neurologic:  Alert and oriented x3;  grossly normal neurologically. Skin:   Intact without significant lesions or rashes. Cervical Nodes:  No significant cervical adenopathy. Psych:  Alert and cooperative. Normal affect.  LAB RESULTS: Recent Labs    02/09/21 1110 02/09/21 1222  WBC  --  10.3  HGB 8.8* 8.2*  HCT  --  24.6*  PLT  --  185   BMET No results for input(s): NA, K, CL, CO2, GLUCOSE, BUN, CREATININE, CALCIUM in the last 72 hours. LFT No results for input(s): PROT, ALBUMIN, AST, ALT, ALKPHOS, BILITOT, BILIDIR, IBILI in the last 72 hours. PT/INR No results for input(s): LABPROT, INR in the last 72 hours.  STUDIES: No results found.  Impression / Plan:   Drayk Humbarger is a 70 y.o. y/o male with 2 days of blood per rectum consisting of black stool initially, now with dark red stool, in the setting of aspirin use at home, with recent prednisone and colchicine use for gout,   Patient has had ongoing abdominal pain for 1-1/2 to 2 months after prednisone and colchicine use, and in the setting of chronic aspirin.  This combination can lead to GI ulcers  CTA has been ordered by ER, and is pending at this time  If CTA is positive, proceed with consulting vascular surgery for embolization  If CTA is negative, proceed with upper endoscopy after medical optimization and if negative, possible colonoscopy  PPI IV twice daily  Continue serial CBCs and transfuse PRN Avoid NSAIDs Maintain 2 large-bore IV lines Please page GI with any acute hemodynamic changes, or signs of active GI bleeding  Differentials include bleeding from esophagitis, NSAID induced ulcers, AVMs  Dr. Vicente Males is on-call over the weekend and will be following the patient  Thank you for involving me in the care of this patient.      LOS: 0 days   [name], MD  02/09/2021, 12:53 PM

## 2021-02-09 NOTE — Telephone Encounter (Signed)
Called patient and let him know that Dr. Bonna Gains recommends for him to go to the ED. Patient agreed and stated that he would be on his way.

## 2021-02-09 NOTE — H&P (Signed)
History and Physical    Mark Wyatt HFG:[id number] DOB: 1951/06/14 DOA: 02/09/2021  Referring MD/NP/PA:   PCP: Lavera Guise, MD   Patient coming from:  The patient is coming from home.  At baseline, pt is independent for most of ADL.        Chief Complaint: Rectal bleeding  HPI: Mark Wyatt is a 70 y.o. male with medical history significant of hypertension, hyperlipidemia, diabetes mellitus, GERD, gout, CAD, stent placement, overactive bladder, AVM of the duodenum, duodenitis, esophagitis, gastric polyp, who presents with rectal bleeding.  Patient states that he has been having intermittent rectal bleeding in the past 3 days.  He has had 5-6 times of bright red rectal bleeding with blood clots.  Patient has some nausea, no vomiting.  Patient states that he had mild lower abdominal pain earlier, which is resolved.  Currently no abdominal pain.  No fever or chills.  Patient does not have chest pain, shortness breath, cough.  No symptoms of UTI.  She reports dizziness and lightheadedness.  Of note, pt had gout and was treated with prednisone and colchicine for 9 days in March 2022. After that, he developed abdominal pain. Upper GI series was done by Dr. Bonna Gains, which showed duodenitis. Per Dr. Michele Mcalpine clinic note, "Patient underwent upper endoscopy 2 weeks ago that showed grade B esophagitis, gastric polyp that was biopsied, nodular mucosa in the duodenal bulb that was biopsied, duodenal erythema, and an AVM that was treated. Pathology showed nonspecific duodenitis, gastric heterotropia, fundic gland gastric polyp". Today, Dr. Bonna Gains recommended patient come to the emergency room for CT angiogram.  ED Course: pt was found to have hemoglobin 8.2 (15.3 on 01/09/2021), negative COVID PCR, pending FOBT, electrolytes renal function okay, temperature normal, blood pressure 126/68, heart rate 48, 101, 92, RR 20, oxygen saturation 98% on room air.  CT angiogram of abdomen/pelvis is negative for  active bleeding, but showed BPH.  Patient is admitted to [facility] bed as inpatient.  Dr. Vicente Males of GI is aware of patient, planning to do EGD in the morning.  Review of Systems:   General: no fevers, chills, no body weight gain, has fatigue HEENT: no blurry vision, hearing changes or sore throat Respiratory: no dyspnea, coughing, wheezing CV: no chest pain, no palpitations GI: has nausea, lower bdominal pain, rectal bleeding, No diarrhea, constipation, vomiting. GU: no dysuria, burning on urination, increased urinary frequency, hematuria  Ext: no leg edema Neuro: no unilateral weakness, numbness, or tingling, no vision change or hearing loss Skin: no rash, no skin tear. MSK: No muscle spasm, no deformity, no limitation of range of movement in spin Heme: No easy bruising.  Travel history: No recent long distant travel.  Allergy:  Allergies  Allergen Reactions  . Penicillins Other (See Comments)    Headache  Headache  . Aspirin Other (See Comments)    02/05/2020-pt has been taking baby aspirin before bed and tolerating well.    Past Medical History:  Diagnosis Date  . Diabetes mellitus without complication ([place])   . Gout   . Hyperlipidemia   . Hypertension   . Myocardial infarction (HCC)    mild  . Overactive bladder    [place] [place] [facility]  . 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 ANAL FISTULOTOMY    . CARDIAC CATHETERIZATION    . CORONARY STENT INTERVENTION N/A 02/24/2017   Procedure: Coronary Stent Intervention;  Surgeon: [name], MD;  Location: [facility];  Service: Cardiovascular;  Laterality: N/A;  . ESOPHAGOGASTRODUODENOSCOPY (EGD) WITH PROPOFOL N/A 01/25/2021   Procedure: ESOPHAGOGASTRODUODENOSCOPY (EGD) WITH PROPOFOL;  Surgeon: [name], MD;  Location: [facility] END[facility];  Service: Endoscopy;  Laterality: N/A;  . LEFT HEART CATH AND CORONARY ANGIOGRAPHY N/A 02/24/2017   Procedure:  Left Heart Cath and Coronary Angiography;  Surgeon: [name], MD;  Location: [facility];  Service: Cardiovascular;  Laterality: N/A;    Social History:  reports that he has quit smoking. He has never used smokeless tobacco. He reports current alcohol use of about 6.0 standard drinks of alcohol per week. He reports that he does not use drugs.  Family History:  Family History  Problem Relation Age of Onset  . Diabetes Mother      Prior to Admission medications   Medication Sig Start Date End Date Taking? Authorizing Provider  amLODipine (NORVASC) 2.5 MG tablet Take 1 tablet (2.5 mg total) by mouth daily. 09/25/20   Lavera Guise, MD  aspirin 81 MG chewable tablet Chew 1 tablet (81 mg total) by mouth daily. 02/25/17   Bettey Costa, MD  atorvastatin (LIPITOR) 40 MG tablet Take 1 tablet (40 mg total) by mouth daily at 6 PM. 02/06/21   Luiz Ochoa, NP  Blood Glucose Monitoring Suppl (FREESTYLE LITE) w/Device KIT 1 each by Does not apply route daily. Use as directed 09/08/20   Lavera Guise, MD  glucose blood (FREESTYLE LITE) test strip Use as instructed as needed once a day DX impaired fasting glucose 02/06/21   Luiz Ochoa, NP  Lancets 30G MISC Use as directed once a day DX impaired fasting glucose 09/08/20   Lavera Guise, MD  metFORMIN (GLUCOPHAGE-XR) 500 MG 24 hr tablet Take 1 tablet (500 mg total) by mouth daily with breakfast. 10/09/20   Luiz Ochoa, NP  nitroGLYCERIN (NITROSTAT) 0.4 MG SL tablet Place 1 tablet (0.4 mg total) under the tongue every 5 (five) minutes as needed for chest pain. 02/25/17   Bettey Costa, MD  pantoprazole (PROTONIX) 40 MG tablet Take 1 tablet (40 mg total) by mouth 2 (two) times daily. 01/25/21 02/24/21  [name], MD  tadalafil (CIALIS) 20 MG tablet 1 tab 1 hour prior to intercourse 10/06/20   Stoioff, Ronda Fairly, MD  trospium (SANCTURA) 20 MG tablet TAKE 1 TABLET(20 MG) BY MOUTH TWICE DAILY 10/23/20   Vaillancourt, Aldona Bar, PA-C   metoprolol tartrate (LOPRESSOR) 25 MG tablet Take 1 tablet (25 mg total) by mouth 2 (two) times daily. 02/25/17 09/06/19  Bettey Costa, MD    Physical Exam: Vitals:   02/09/21 1410 02/09/21 1425 02/09/21 1500 02/09/21 1545  BP: 138/66 126/68 127/65 115/71  Pulse: 93 92 87 86  Resp: _0 Temp: 98.8 F (37.1 C) 98.4 F (36.9 C)  98.2 F (36.8 C)  TempSrc: Oral Oral  Oral  SpO2:  100% 99% 99%  Weight:      Height:       General: Not in acute distress. Pale looking HEENT:       Eyes: PERRL, EOMI, no scleral icterus.       ENT: No discharge from the ears and nose, no pharynx injection, no tonsillar enlargement.        Neck: No JVD, no bruit, no mass felt. Heme: No neck lymph node enlargement. Cardiac: S1/S2, RRR, No murmurs, No gallops or rubs. Respiratory: No rales, wheezing, rhonchi or rubs. GI: Soft,  nondistended, nontender, no rebound pain, no organomegaly, BS present. GU: No hematuria Ext: No pitting leg edema bilaterally. 1+DP/PT pulse bilaterally. Musculoskeletal: No joint deformities, No joint redness or warmth, no limitation of ROM in spin. Skin: No rashes.  Neuro: Alert, oriented X3, cranial nerves II-XII grossly intact, moves all extremities normally. Psych: Patient is not psychotic, no suicidal or hemocidal ideation.  Labs on Admission: I have personally reviewed following labs and imaging studies  CBC: Recent Labs  Lab 02/09/21 1110 02/09/21 1222  WBC  --  10.3  HGB 8.8* 8.2*  HCT  --  24.6*  MCV  --  85.1  PLT  --  403   Basic Metabolic Panel: Recent Labs  Lab 02/09/21 1222  NA 138  K 4.2  CL 106  CO2 24  GLUCOSE 133*  BUN 36*  CREATININE 0.97  CALCIUM 8.6*   GFR: Estimated Creatinine Clearance: 75.5 mL/min (by C-G formula based on SCr of 0.97 mg/dL). Liver Function Tests: Recent Labs  Lab 02/09/21 1222  AST 18  ALT 16  ALKPHOS 61  BILITOT 0.5  PROT 6.1*  ALBUMIN 3.3*   No results for input(s): LIPASE, AMYLASE in the last 168  hours. No results for input(s): AMMONIA in the last 168 hours. Coagulation Profile: Recent Labs  Lab 02/09/21 1222  INR 1.0   Cardiac Enzymes: No results for input(s): CKTOTAL, CKMB, CKMBINDEX, TROPONINI in the last 168 hours. BNP (last 3 results) No results for input(s): PROBNP in the last 8760 hours. HbA1C: No results for input(s): HGBA1C in the last 72 hours. CBG: No results for input(s): GLUCAP in the last 168 hours. Lipid Profile: No results for input(s): CHOL, HDL, LDLCALC, TRIG, CHOLHDL, LDLDIRECT in the last 72 hours. Thyroid Function Tests: No results for input(s): TSH, T4TOTAL, FREET4, T3FREE, THYROIDAB in the last 72 hours. Anemia Panel: No results for input(s): VITAMINB12, FOLATE, FERRITIN, TIBC, IRON, RETICCTPCT in the last 72 hours. Urine analysis:    Component Value Date/Time   COLORURINE YELLOW (A) 09/06/2019 2041   APPEARANCEUR Clear 11/22/2020 0944   LABSPEC 1.020 09/06/2019 2041   PHURINE 6.0 09/06/2019 2041   GLUCOSEU Negative 11/22/2020 0944   HGBUR SMALL (A) 09/06/2019 2041   BILIRUBINUR Negative 11/22/2020 [place] 09/06/2019 2041   PROTEINUR Negative 11/22/2020 [place] NEGATIVE 09/06/2019 2041   NITRITE Negative 11/22/2020 0944   NITRITE NEGATIVE 09/06/2019 2041   LEUKOCYTESUR Negative 11/22/2020 0944   LEUKOCYTESUR NEGATIVE 09/06/2019 2041   Sepsis Labs: _0 (procalcitonin:4,lacticidven:4) ) Recent Results (from the past 240 hour(s))  Resp Panel by RT-PCR (Flu A&B, Covid) Nasopharyngeal Swab     Status: None   Collection Time: 02/09/21  1:07 PM   Specimen: Nasopharyngeal Swab; Nasopharyngeal(NP) swabs in vial transport medium  Result Value Ref Range Status   SARS Coronavirus 2 by RT PCR NEGATIVE NEGATIVE Final    Comment: (NOTE) SARS-CoV-2 target nucleic acids are NOT DETECTED.  The SARS-CoV-2 RNA is generally detectable in upper respiratory specimens during the acute phase of infection. The  lowest concentration of SARS-CoV-2 viral copies this assay can detect is 138 copies/mL. A negative result does not preclude SARS-Cov-2 infection and should not be used as the sole basis for treatment or other patient management decisions. A negative result may occur with  improper specimen collection/handling, submission of specimen other than nasopharyngeal swab, presence of viral mutation(s) within the areas targeted by this assay, and inadequate number of viral copies(<138 copies/mL). A negative result must be combined  with clinical observations, patient history, and epidemiological information. The expected result is Negative.  Fact Sheet for Patients:  EntrepreneurPulse.com.au  Fact Sheet for Healthcare Providers:  IncredibleEmployment.be  This test is no t yet approved or cleared by the [place] FDA and  has been authorized for detection and/or diagnosis of SARS-CoV-2 by FDA under an Emergency Use Authorization (EUA). This EUA will remain  in effect (meaning this test can be used) for the duration of the COVID-19 declaration under Section 564(b)(1) of the Act, 21 U.S.C.section 360bbb-3(b)(1), unless the authorization is terminated  or revoked sooner.       Influenza A by PCR NEGATIVE NEGATIVE Final   Influenza B by PCR NEGATIVE NEGATIVE Final    Comment: (NOTE) The Xpert Xpress SARS-CoV-2/FLU/RSV plus assay is intended as an aid in the diagnosis of influenza from Nasopharyngeal swab specimens and should not be used as a sole basis for treatment. Nasal washings and aspirates are unacceptable for Xpert Xpress SARS-CoV-2/FLU/RSV testing.  Fact Sheet for Patients: EntrepreneurPulse.com.au  Fact Sheet for Healthcare Providers: IncredibleEmployment.be  This test is not yet approved or cleared by the [place] FDA and has been authorized for detection and/or diagnosis of SARS-CoV-2 by FDA under  an Emergency Use Authorization (EUA). This EUA will remain in effect (meaning this test can be used) for the duration of the COVID-19 declaration under Section 564(b)(1) of the Act, 21 U.S.C. section 360bbb-3(b)(1), unless the authorization is terminated or revoked.  Performed at [facility], [address] [place], [place] [address]      Radiological Exams on Admission: CT Angio Abd/Pel W and/or Wo Contrast  Result Date: 02/09/2021 CLINICAL DATA:  70 year old with rectal bleeding. EXAM: CTA ABDOMEN AND PELVIS WITHOUT AND WITH CONTRAST TECHNIQUE: Multidetector CT imaging of the abdomen and pelvis was performed using the standard protocol during bolus administration of intravenous contrast. Multiplanar reconstructed images and MIPs were obtained and reviewed to evaluate the vascular anatomy. CONTRAST:  111m OMNIPAQUE IOHEXOL 350 MG/ML SOLN COMPARISON:  08/27/2019 FINDINGS: VASCULAR Aorta: Atherosclerotic disease in the abdominal aorta without aneurysm, dissection or significant stenosis. Celiac: Minimal narrowing at the origin of the celiac trunk. Main branch vessels are patent. Negative for aneurysm or dissection. SMA: Patent without evidence of aneurysm, dissection, vasculitis or significant stenosis. Renals: Single right renal artery is patent with atherosclerotic disease but no significant stenosis. Main left renal artery is patent without significant stenosis. There are 3 small accessory left renal arteries. IMA: Patent without evidence of aneurysm, dissection, vasculitis or significant stenosis. Inflow: Atherosclerotic disease without aneurysm, dissection or significant stenosis. Common, internal external iliac arteries are patent bilaterally. Proximal Outflow: Proximal femoral arteries are patent bilaterally. Veins: Hepatic veins are patent. Portal venous system is patent. Normal appearance of the IVC and renal veins. Review of the MIP images confirms the above findings. NON-VASCULAR  Lower chest: Lung bases are clear without pleural effusions. Coronary artery calcifications. Hepatobiliary: Normal appearance of the liver and gallbladder. No suspicious hepatic lesions. No biliary dilatation. Pancreas: Unremarkable. No pancreatic ductal dilatation or surrounding inflammatory changes. Spleen: Normal in size without focal abnormality. Adrenals/Urinary Tract: Normal appearance of the adrenal glands. There is a large exophytic cyst adjacent to the right adrenal gland. Bilateral renal cysts. No hydronephrosis. No suspicious renal lesions. Normal appearance of the urinary bladder. No kidney stones. Stomach/Bowel: Stomach is within normal limits. No evidence of bowel wall thickening, distention, or inflammatory changes. No evidence for intraluminal contrast or active GI bleeding. Lymphatic: No lymph node enlargement  in the abdomen or pelvis. Reproductive: Enlarged prostate. Other: Negative for ascites. Musculoskeletal: No acute bone abnormality. IMPRESSION: VASCULAR 1. No active GI bleeding. 2. Aortic Atherosclerosis (ICD10-I70.0). Coronary artery calcifications. NON-VASCULAR 1. No acute abnormality in the abdomen or pelvis. 2. Bilateral renal cysts. 3. Prostate enlargement. Electronically Signed   By: Markus Daft M.D.   On: 02/09/2021 14:47     EKG: I have personally reviewed.  Sinus rhythm, QTC 386, ventricular bigeminy  Assessment/Plan Principal Problem:   Rectal bleeding Active Problems:   Acute blood loss anemia   Ventricular bigeminy   HTN (hypertension)   HLD (hyperlipidemia)   GERD (gastroesophageal reflux disease)   CAD (coronary artery disease)   Symptomatic anemia   Rectal bleeding and symptomatic anemia and acute blood loss anemia: Hgb 15.3 -->8.2. likely due to upper GIB. Pt has hx of  AVM of duodenum, duodenitis, esophagitis amd gastric polyp, all of which can cause bleeding. Dr. Vicente Males is aware for patient, planning to do EGD morning per Dr. Michele Mcalpine note.  - will  admitted to med-surg bed as inpatient - hold ASA - transfuse 1 units of blood now - IVF: 500 mL NS bolus, then at 75 mL/hr - Start IV pantoprazole gtt - Zofran IV for nausea - Avoid NSAIDs and SQ heparin - Maintain IV access (2 large bore IVs if possible). - Monitor closely and follow q6h cbc, transfuse as necessary, if Hgb<7.0 - LaB: INR, PTT and type screen  Ventricular bigeminy: Patient is asymptomatic.  No chest pain or palpitation. -Cardiac monitor -Check troponin  HTN (hypertension) -IV hydralazine as needed -Amlodipine  HLD (hyperlipidemia) -Lipitor  GERD (gastroesophageal reflux disease) -On IV Protonix  CAD (coronary artery disease): No chest pain -Hold aspirin -Continue Lipitor -As needed nitroglycerin     DVT ppx: SCD Code Status: Full code Family Communication:  Yes, patient's wife at bed side Disposition Plan:  Anticipate discharge back to previous environment Consults called:  Dr. Vicente Males is on board Admission status and Level of care: Med-Surg:   as inpt      Status is: Inpatient  Remains inpatient appropriate because:Inpatient level of care appropriate due to severity of illness   Dispo: The patient is from: Home              Anticipated d/c is to: Home              Patient currently is not medically stable to d/c.   Difficult to place patient No           Date of Service 02/09/2021    [facility]   If 7PM-7AM, please contact night-coverage [email or web address] 02/09/2021, 4:37 PM

## 2021-02-09 NOTE — Telephone Encounter (Signed)
Called patient again and he did not answer. However, I sent him a patient message through [place] since he is active with recommendations. I hope he is able to read so he could either go to the [facility] and have his hemoglobin checked as STAT or go to the ED to be evaluated since he had another bowel movement with blood. Dr. Bonna Gains will be notified via secure chat for additional recommendations. Let's see what he decides on doing.

## 2021-02-09 NOTE — Progress Notes (Addendum)
CTA is negative. Patient and wife state pt is feeling better since receiving blood, IV fluids and IV protonix. He has not had any further BMs since the one this morning that occurred during the initial lab draw around 11 am. No emesis. BP and HR are quite normal and stable on flowsheet documentation with the most recent being 127/65 and HR 82.  Pt will be added on for Upper endoscopy tomorrow. This will allow for medical optimization today with IV fluids, protonix, PRBC transfusion to allow for a safe endoscopic procedure  Once pt improves and if no further bleeding occurs ok to start clear liquid diet for today and NPO past midnight for endoscopy tomorrow AM with Dr. Vicente Males.  PPI IV twice daily  Continue serial CBCs and transfuse PRN Avoid NSAIDs Maintain 2 large-bore IV lines Please page GI on call with any acute hemodynamic changes, or signs of active GI bleeding to re-evaluate for need for sooner endoscopy   On call GI Dr. Vicente Males aware about patient and above plan

## 2021-02-10 ENCOUNTER — Encounter: Payer: Self-pay | Admitting: Internal Medicine

## 2021-02-10 ENCOUNTER — Encounter
Admission: EM | Disposition: A | Discharge status: Home / Self Care | Payer: Self-pay | Source: Home / Self Care | Attending: Internal Medicine

## 2021-02-10 ENCOUNTER — Inpatient Hospital Stay: Payer: Medicare Other | Admitting: Anesthesiology

## 2021-02-10 DIAGNOSIS — I10 Essential (primary) hypertension: Secondary | ICD-10-CM | POA: Diagnosis not present

## 2021-02-10 DIAGNOSIS — K625 Hemorrhage of anus and rectum: Secondary | ICD-10-CM | POA: Diagnosis not present

## 2021-02-10 DIAGNOSIS — D649 Anemia, unspecified: Secondary | ICD-10-CM | POA: Diagnosis not present

## 2021-02-10 DIAGNOSIS — D62 Acute posthemorrhagic anemia: Secondary | ICD-10-CM | POA: Diagnosis not present

## 2021-02-10 HISTORY — PX: ESOPHAGOGASTRODUODENOSCOPY: SHX5428

## 2021-02-10 LAB — CBC
HCT: 23.1 % — ABNORMAL LOW (ref 39.0–52.0)
HCT: 23.8 % — ABNORMAL LOW (ref 39.0–52.0)
HCT: 23.3 % — ABNORMAL LOW (ref 39.0–52.0)
Hemoglobin: 7.9 g/dL — ABNORMAL LOW (ref 13.0–17.0)
Hemoglobin: 7.8 g/dL — ABNORMAL LOW (ref 13.0–17.0)
Hemoglobin: 7.7 g/dL — ABNORMAL LOW (ref 13.0–17.0)
MCH: 28.7 pg (ref 26.0–34.0)
MCH: 28.2 pg (ref 26.0–34.0)
MCH: 28.7 pg (ref 26.0–34.0)
MCHC: 34.2 g/dL (ref 30.0–36.0)
MCHC: 32.8 g/dL (ref 30.0–36.0)
MCHC: 33 g/dL (ref 30.0–36.0)
MCV: 84 fL (ref 80.0–100.0)
MCV: 85.9 fL (ref 80.0–100.0)
MCV: 86.9 fL (ref 80.0–100.0)
Platelets: 148 10*3/uL — ABNORMAL LOW (ref 150–400)
Platelets: 158 10*3/uL (ref 150–400)
Platelets: 151 10*3/uL (ref 150–400)
RBC: 2.75 MIL/uL — ABNORMAL LOW (ref 4.22–5.81)
RBC: 2.77 MIL/uL — ABNORMAL LOW (ref 4.22–5.81)
RBC: 2.68 MIL/uL — ABNORMAL LOW (ref 4.22–5.81)
RDW: 14.8 % (ref 11.5–15.5)
RDW: 14.8 % (ref 11.5–15.5)
RDW: 15 % (ref 11.5–15.5)
WBC: 6.6 10*3/uL (ref 4.0–10.5)
WBC: 5.8 10*3/uL (ref 4.0–10.5)
WBC: 6.2 10*3/uL (ref 4.0–10.5)
nRBC: 0 % (ref 0.0–0.2)
nRBC: 0 % (ref 0.0–0.2)
nRBC: 0 % (ref 0.0–0.2)

## 2021-02-10 LAB — BASIC METABOLIC PANEL
Anion gap: 7 (ref 5–15)
BUN: 22 mg/dL (ref 8–23)
CO2: 23 mmol/L (ref 22–32)
Calcium: 8.3 mg/dL — ABNORMAL LOW (ref 8.9–10.3)
Chloride: 108 mmol/L (ref 98–111)
Creatinine, Ser: 0.85 mg/dL (ref 0.61–1.24)
GFR, Estimated: >60 mL/min (ref >60)
Glucose, Bld: 126 mg/dL — ABNORMAL HIGH (ref 70–99)
Potassium: 3.9 mmol/L (ref 3.5–5.1)
Sodium: 138 mmol/L (ref 135–145)

## 2021-02-10 LAB — GLUCOSE, CAPILLARY
Glucose-Capillary: 118 mg/dL — ABNORMAL HIGH (ref 70–99)
Glucose-Capillary: 121 mg/dL — ABNORMAL HIGH (ref 70–99)
Glucose-Capillary: 103 mg/dL — ABNORMAL HIGH (ref 70–99)

## 2021-02-10 LAB — HEMOGLOBIN AND HEMATOCRIT, BLOOD
HCT: 24.3 % — ABNORMAL LOW (ref 39.0–52.0)
Hemoglobin: 8.1 g/dL — ABNORMAL LOW (ref 13.0–17.0)

## 2021-02-10 SURGERY — EGD (ESOPHAGOGASTRODUODENOSCOPY)
Anesthesia: General

## 2021-02-10 MED ORDER — CHLORHEXIDINE GLUCONATE 0.12 % MT SOLN
15.0000 mL | Freq: Two times a day (BID) | OROMUCOSAL | Status: DC
  Administered 2021-02-10 – 2021-02-13 (×6): 15 mL via OROMUCOSAL
  Filled 2021-02-10 (×5): qty 15

## 2021-02-10 MED ORDER — AMLODIPINE BESYLATE 5 MG PO TABS
2.5000 mg | ORAL_TABLET | Freq: Every day | ORAL | Status: DC
  Administered 2021-02-11 – 2021-02-13 (×3): 2.5 mg via ORAL
  Filled 2021-02-10 (×4): qty 1

## 2021-02-10 MED ORDER — PEG 3350-KCL-NA BICARB-NACL 420 G PO SOLR
4000.0000 mL | Freq: Once | ORAL | Status: AC
  Administered 2021-02-11: 4000 mL via ORAL
  Filled 2021-02-10: qty 4000

## 2021-02-10 MED ORDER — SODIUM CHLORIDE 0.9 % IV SOLN: INTRAVENOUS | Status: DC

## 2021-02-10 MED ORDER — ORAL CARE MOUTH RINSE
15.0000 mL | Freq: Two times a day (BID) | OROMUCOSAL | Status: DC
  Administered 2021-02-10 – 2021-02-12 (×5): 15 mL via OROMUCOSAL

## 2021-02-10 MED ORDER — NITROGLYCERIN 0.4 MG SL SUBL: 0.4000 mg | SUBLINGUAL_TABLET | SUBLINGUAL | Status: DC | PRN

## 2021-02-10 MED ORDER — PROPOFOL 10 MG/ML IV BOLUS
INTRAVENOUS | Status: DC | PRN
  Administered 2021-02-10: 30 mg via INTRAVENOUS
  Administered 2021-02-10: 70 mg via INTRAVENOUS

## 2021-02-10 MED ORDER — DARIFENACIN HYDROBROMIDE ER 7.5 MG PO TB24
7.5000 mg | ORAL_TABLET | Freq: Every day | ORAL | Status: DC
  Administered 2021-02-11 – 2021-02-13 (×3): 7.5 mg via ORAL
  Filled 2021-02-10 (×5): qty 1

## 2021-02-10 MED ORDER — ATORVASTATIN CALCIUM 20 MG PO TABS
40.0000 mg | ORAL_TABLET | Freq: Every day | ORAL | Status: DC
  Administered 2021-02-10 – 2021-02-12 (×3): 40 mg via ORAL
  Filled 2021-02-10 (×3): qty 2

## 2021-02-10 NOTE — Op Note (Signed)
[facility] Patient Name: Mark Wyatt Procedure Date: 02/10/2021 10:28 AM MRN: [id number] Account #: [id number] Date of Birth: [birth year] Admit Type: Inpatient Age: 70 Room: [facility] ENDO ROOM 4 Gender: Male Note Status: Finalized Procedure:             Upper GI endoscopy Indications:           [place] Providers:             [name] MD, MD Referring MD:          Lavera Guise, MD (Referring MD) Medicines:             Monitored Anesthesia Care Complications:         No immediate complications. Procedure:             Pre-Anesthesia Assessment:                        - Prior to the procedure, a History and Physical was                         performed, and patient medications, allergies and                         sensitivities were reviewed. The patient's tolerance                         of previous anesthesia was reviewed.                        - The risks and benefits of the procedure and the                         sedation options and risks were discussed with the                         patient. All questions were answered and informed                         consent was obtained.                        - ASA Grade Assessment: II - A patient with mild                         systemic disease.                        After obtaining informed consent, the endoscope was                         passed under direct vision. Throughout the procedure,                         the patient's blood pressure, pulse, and oxygen                         saturations were monitored continuously. The Endoscope                         was introduced through the mouth, and advanced  to the                         third part of duodenum. The upper GI endoscopy was                         accomplished with ease. The patient tolerated the                         procedure well. Findings:      The esophagus was normal.      The stomach was normal.      The examined  duodenum was normal.      The cardia and gastric fundus were normal on retroflexion. Impression:            - Normal esophagus.                        - Normal stomach.                        - Normal examined duodenum.                        - No specimens collected. Recommendation:        - Return patient to hospital ward for ongoing care.                        - Clear liquids today and plan for colonoscopy on                         mpnday with bowel prep to start tomorrow Procedure Code(s):     --- Professional ---                        [phone], Esophagogastroduodenoscopy, flexible,                         transoral; diagnostic, including collection of                         specimen(s) by brushing or washing, when performed                         (separate procedure) Diagnosis Code(s):     --- Professional ---                        K92.1, Melena (includes Hematochezia) CPT copyright 2019 American Medical Association. All rights reserved. The codes documented in this report are preliminary and upon coder review may  be revised to meet current compliance requirements. [name], MD [name] MD, MD 02/10/2021 10:48:04 AM This report has been signed electronically. Number of Addenda: 0 Note Initiated On: 02/10/2021 10:28 AM Estimated Blood Loss:  Estimated blood loss: none.      [facility]

## 2021-02-10 NOTE — Progress Notes (Signed)
PROGRESS NOTE    Mark Wyatt   TSV:[id number]  DOB: 1951-07-11  PCP: Lavera Guise, MD    DOA: 02/09/2021 LOS: 1   Brief Narrative   Mark Wyatt is a 70 y.o. male with medical history significant of hypertension, hyperlipidemia, diabetes mellitus, GERD, gout, CAD, stent placement, overactive bladder, AVM of the duodenum, duodenitis, esophagitis, gastric polyp, who presented to the ED on 02/09/21 with intermittent rectal bleeding x 3 days.  Bleeding described as bright red with some clots.  Reported being dizzy and lightheaded, nausea without vomiting.  Recent GI history:  Referred to the ED for CT angiogram when seen in GI clinic. Pt was treated with prednisone and colchicine for 9 days in March 2022 for acute gout flare. He subsequently developed abdominal pain, had upper GI series showing duodenitis.   Underwent EGD on 01/25/21:  - LA Grade B reflux esophagitis with no bleeding. - White nummular lesions in esophageal mucosa. Biopsied. - A single gastric polyp. Biopsied. 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 Treated with argon plasma coagulation (APC). - Erythematous duodenopathy. Biopsied. - Nodular mucosa in the duodenal bulb. Biopsied.  Hemoglobin on admission was 8.2, down from 15.3 on 01/09/21.  Hemodynamically stable.  CTA abdomen/pelvis was negative for active bleeding.  Admitted to hospitalist service, GI consulted. Transfused 1 unit pRBC's on admission. Getting IV fluids and on Protonix drip x 72 hours.  EGD on 4/23 was normal.   Colonoscopy planned for Monday 4/25.    Assessment & Plan   Principal Problem:   Rectal bleeding Active Problems:   Acute blood loss anemia   Ventricular bigeminy   HTN (hypertension)   HLD (hyperlipidemia)   GERD (gastroesophageal reflux disease)   CAD (coronary artery disease)   Symptomatic anemia   GI bleeding with melena - Acute blood loss anemia - Hbg 15.3>>8.2 on admission Symptomatic anemia - pt  dizzy/lightheaded, weak Patient was having black stools which then became maroon, in setting of aspirin use and recent course of prednisone and colchicine for gout with persistent abdominal pain since. --GI following --Protonix drip x 72 hours then BID --Maintenance IV fluids -- Clear liquid diet -- N.p.o. after midnight Sunday -- Colonoscopy on Monday -- PRN Zofran -- Trend hemoglobin -- Transfuse for Hbg < 7.0 or he develops recurrent bleeding -- Maintain 2 large-bore IVs -- Paged GI stat if worsening hemodynamics and bleeding  GERD -currently on IV Protonix.    Ventricular bigeminy -patient asymptomatic, no chest pain, no palpitations.  Troponins checked on admission were normal. --Monitor on telemetry  Hypertension -chronic, stable --Continue home amlodipine --As needed hydralazine  Hyperlipidemia -continue Lipitor.    Coronary artery disease -stable without any chest pain. --Hold aspirin --Continue Lipitor, and PRN SL nitro    Patient BMI: Body mass index is 25.66 kg/m.   DVT prophylaxis: SCDs Start: 02/10/21 0740   Diet:  Diet Orders (From admission, onward)    Start     Ordered   02/11/21 0001  Diet NPO time specified  Diet effective midnight        02/10/21 1053   02/10/21 1050  Diet clear liquid Room service appropriate? Yes; Fluid consistency: Thin  Diet effective now       Question Answer Comment  Room service appropriate? Yes   Fluid consistency: Thin      04 /23/22 1049            Code Status: Full Code    Subjective 02/10/21    Patient  seen with wife at bedside following his EGD this morning.  EGD was normal.  He denies having any bowel movements or bleeding since admission.  Reports being hungry but okay with clear liquid diet for now.  Agreeable to colonoscopy Monday.  No other acute complaints.  Feels a little better after getting blood transfusion.   Disposition Plan & Communication   Status is: Inpatient  Inpatient status appropriate  due to significant acute blood loss anemia requiring transfusion and close monitoring, ongoing evaluation not appropriate for outpatient setting.  Dispo: The patient is from: Home              Anticipated d/c is to: Home              Patient currently not medically stable for discharge   Difficult to place patient no  Family Communication: Wife at bedside on rounds today   Consults, Procedures, Significant Events   Consultants:   Gastroenterology  Procedures:   EGD on 02/10/2021 -normal, see report  Antimicrobials:  Anti-infectives (From admission, onward)   None        Micro    Objective   Vitals:   02/10/21 1058 02/10/21 1115 02/10/21 1123 02/10/21 1214  BP: 135/67 105/63 111/60 115/65  Pulse: 74 97 69 69  Resp: 16 14 19 15   Temp:   98.6 F (37 C) 100.2 F (37.9 C)  TempSrc:    Oral  SpO2: 100% 93% 100% 100%  Weight:      Height:        Intake/Output Summary (Last 24 hours) at 02/10/2021 1558 Last data filed at 02/10/2021 1538 Gross per 24 hour  Intake 3250.65 ml  Output 1750 ml  Net 1500.65 ml   Filed Weights   02/09/21 1205  Weight: 83.5 kg    Physical Exam:  General exam: awake, alert, no acute distress HEENT: moist mucus membranes, hearing grossly normal  Respiratory system: normal respiratory effort, symmetric chest rise, on room air. Cardiovascular system: RRR, no peripheral edema.   Gastrointestinal system: Soft and nontender Central nervous system: A&O x4. no gross focal neurologic deficits, normal speech Skin: dry, intact, normal temperature, normal color, no rashes seen on visualized skin Psychiatry: normal mood, congruent affect, judgement and insight appear normal  Labs   Data Reviewed: I have personally reviewed following labs and imaging studies  CBC: Recent Labs  Lab 02/09/21 1705 02/09/21 1948 02/10/21 0252 02/10/21 0914 02/10/21 1513  WBC 8.4 7.7 6.6 5.8 6.2  HGB 8.4* 8.2* 7.9* 7.8* 7.7*  HCT 24.4* 24.5* 23.1* 23.8*  23.3*  MCV 83.0 84.8 84.0 85.9 86.9  PLT 172 155 148* 158 564   Basic Metabolic Panel: Recent Labs  Lab 02/09/21 1222 02/10/21 0914  NA 138 138  K 4.2 3.9  CL 106 108  CO2 24 23  GLUCOSE 133* 126*  BUN 36* 22  CREATININE 0.97 0.85  CALCIUM 8.6* 8.3*   GFR: Estimated Creatinine Clearance: 86.1 mL/min (by C-G formula based on SCr of 0.85 mg/dL). Liver Function Tests: Recent Labs  Lab 02/09/21 1222  AST 18  ALT 16  ALKPHOS 61  BILITOT 0.5  PROT 6.1*  ALBUMIN 3.3*   No results for input(s): LIPASE, AMYLASE in the last 168 hours. No results for input(s): AMMONIA in the last 168 hours. Coagulation Profile: Recent Labs  Lab 02/09/21 1222  INR 1.0   Cardiac Enzymes: No results for input(s): CKTOTAL, CKMB, CKMBINDEX, TROPONINI in the last 168 hours. BNP (last 3 results) No  results for input(s): PROBNP in the last 8760 hours. HbA1C: No results for input(s): HGBA1C in the last 72 hours. CBG: Recent Labs  Lab 02/09/21 1727 02/09/21 2157 02/10/21 1216  GLUCAP 86 99 118*   Lipid Profile: No results for input(s): CHOL, HDL, LDLCALC, TRIG, CHOLHDL, LDLDIRECT in the last 72 hours. Thyroid Function Tests: No results for input(s): TSH, T4TOTAL, FREET4, T3FREE, THYROIDAB in the last 72 hours. Anemia Panel: No results for input(s): VITAMINB12, FOLATE, FERRITIN, TIBC, IRON, RETICCTPCT in the last 72 hours. Sepsis Labs: No results for input(s): PROCALCITON, LATICACIDVEN in the last 168 hours.  Recent Results (from the past 240 hour(s))  Resp Panel by RT-PCR (Flu A&B, Covid) Nasopharyngeal Swab     Status: None   Collection Time: 02/09/21  1:07 PM   Specimen: Nasopharyngeal Swab; Nasopharyngeal(NP) swabs in vial transport medium  Result Value Ref Range Status   SARS Coronavirus 2 by RT PCR NEGATIVE NEGATIVE Final    Comment: (NOTE) SARS-CoV-2 target nucleic acids are NOT DETECTED.  The SARS-CoV-2 RNA is generally detectable in upper respiratory specimens during the  acute phase of infection. The lowest concentration of SARS-CoV-2 viral copies this assay can detect is 138 copies/mL. A negative result does not preclude SARS-Cov-2 infection and should not be used as the sole basis for treatment or other patient management decisions. A negative result may occur with  improper specimen collection/handling, submission of specimen other than nasopharyngeal swab, presence of viral mutation(s) within the areas targeted by this assay, and inadequate number of viral copies(<138 copies/mL). A negative result must be combined with clinical observations, patient history, and epidemiological information. The expected result is Negative.  Fact Sheet for Patients:  EntrepreneurPulse.com.au  Fact Sheet for Healthcare Providers:  IncredibleEmployment.be  This test is no t yet approved or cleared by the [place] FDA and  has been authorized for detection and/or diagnosis of SARS-CoV-2 by FDA under an Emergency Use Authorization (EUA). This EUA will remain  in effect (meaning this test can be used) for the duration of the COVID-19 declaration under Section 564(b)(1) of the Act, 21 U.S.C.section 360bbb-3(b)(1), unless the authorization is terminated  or revoked sooner.       Influenza A by PCR NEGATIVE NEGATIVE Final   Influenza B by PCR NEGATIVE NEGATIVE Final    Comment: (NOTE) The Xpert Xpress SARS-CoV-2/FLU/RSV plus assay is intended as an aid in the diagnosis of influenza from Nasopharyngeal swab specimens and should not be used as a sole basis for treatment. Nasal washings and aspirates are unacceptable for Xpert Xpress SARS-CoV-2/FLU/RSV testing.  Fact Sheet for Patients: EntrepreneurPulse.com.au  Fact Sheet for Healthcare Providers: IncredibleEmployment.be  This test is not yet approved or cleared by the [place] FDA and has been authorized for detection and/or  diagnosis of SARS-CoV-2 by FDA under an Emergency Use Authorization (EUA). This EUA will remain in effect (meaning this test can be used) for the duration of the COVID-19 declaration under Section 564(b)(1) of the Act, 21 U.S.C. section 360bbb-3(b)(1), unless the authorization is terminated or revoked.  Performed at [facility], [place],  [id number]       Imaging Studies   CT Angio Abd/Pel W and/or Wo Contrast  Result Date: 02/09/2021 CLINICAL DATA:  70 year old with rectal bleeding. EXAM: CTA ABDOMEN AND PELVIS WITHOUT AND WITH CONTRAST TECHNIQUE: Multidetector CT imaging of the abdomen and pelvis was performed using the standard protocol during bolus administration of intravenous contrast. Multiplanar reconstructed images and MIPs were obtained  and reviewed to evaluate the vascular anatomy. CONTRAST:  149mL OMNIPAQUE IOHEXOL 350 MG/ML SOLN COMPARISON:  08/27/2019 FINDINGS: VASCULAR Aorta: Atherosclerotic disease in the abdominal aorta without aneurysm, dissection or significant stenosis. Celiac: Minimal narrowing at the origin of the celiac trunk. Main branch vessels are patent. Negative for aneurysm or dissection. SMA: Patent without evidence of aneurysm, dissection, vasculitis or significant stenosis. Renals: Single right renal artery is patent with atherosclerotic disease but no significant stenosis. Main left renal artery is patent without significant stenosis. There are 3 small accessory left renal arteries. IMA: Patent without evidence of aneurysm, dissection, vasculitis or significant stenosis. Inflow: Atherosclerotic disease without aneurysm, dissection or significant stenosis. Common, internal external iliac arteries are patent bilaterally. Proximal Outflow: Proximal femoral arteries are patent bilaterally. Veins: Hepatic veins are patent. Portal venous system is patent. Normal appearance of the IVC and renal veins. Review of the MIP images confirms the  above findings. NON-VASCULAR Lower chest: Lung bases are clear without pleural effusions. Coronary artery calcifications. Hepatobiliary: Normal appearance of the liver and gallbladder. No suspicious hepatic lesions. No biliary dilatation. Pancreas: Unremarkable. No pancreatic ductal dilatation or surrounding inflammatory changes. Spleen: Normal in size without focal abnormality. Adrenals/Urinary Tract: Normal appearance of the adrenal glands. There is a large exophytic cyst adjacent to the right adrenal gland. Bilateral renal cysts. No hydronephrosis. No suspicious renal lesions. Normal appearance of the urinary bladder. No kidney stones. Stomach/Bowel: Stomach is within normal limits. No evidence of bowel wall thickening, distention, or inflammatory changes. No evidence for intraluminal contrast or active GI bleeding. Lymphatic: No lymph node enlargement in the abdomen or pelvis. Reproductive: Enlarged prostate. Other: Negative for ascites. Musculoskeletal: No acute bone abnormality. IMPRESSION: VASCULAR 1. No active GI bleeding. 2. Aortic Atherosclerosis (ICD10-I70.0). Coronary artery calcifications. NON-VASCULAR 1. No acute abnormality in the abdomen or pelvis. 2. Bilateral renal cysts. 3. Prostate enlargement. Electronically Signed   By: Markus Daft M.D.   On: 02/09/2021 14:47     Medications   Scheduled Meds: . amLODipine  2.5 mg Oral Daily  . atorvastatin  40 mg Oral q1800  . chlorhexidine  15 mL Mouth Rinse BID  . darifenacin  7.5 mg Oral Daily  . insulin aspart  0-5 Units Subcutaneous QHS  . insulin aspart  0-9 Units Subcutaneous TID WC  . mouth rinse  15 mL Mouth Rinse q12n4p  . [START ON 02/13/2021] pantoprazole  40 mg Intravenous Q12H  . polyethylene glycol-electrolytes  4,000 mL Oral Once   Continuous Infusions: . sodium chloride 75 mL/hr at 02/10/21 1538  . sodium chloride    . pantoprozole (PROTONIX) infusion 8 mg/hr (02/10/21 1538)       LOS: 1 day    Time spent: 30  minutes    Ezekiel Slocumb, DO [facility]  02/10/2021, 3:58 PM      If 7PM-7AM, please contact night-coverage. How to contact the [facility] Attending or Consulting provider [place] or covering provider during after hours [place], for this patient?    1. Check the care team in [facility] and look for a) attending/consulting [facility] provider listed and b) the [facility] team listed 2. Log into [email or web address] and use [place]'s universal password to access. If you do not have the password, please contact the hospital operator. 3. Locate the [facility] provider you are looking for under [facility] and page to a number that you can be directly reached. 4. If you still have difficulty reaching the provider, please page the [facility] (Director on  Call) for the Hospitalists listed on amion for assistance.

## 2021-02-10 NOTE — Hospital Course (Addendum)
Mark Wyatt is a 70 y.o. male with medical history significant of hypertension, hyperlipidemia, diabetes mellitus, GERD, gout, CAD, stent placement, overactive bladder, AVM of the duodenum, duodenitis, esophagitis, gastric polyp, who presented to the ED on 02/09/21 with intermittent rectal bleeding x 3 days.  Bleeding described as bright red with some clots.  Reported being dizzy and lightheaded, nausea without vomiting.  Recent GI history:  Referred to the ED for CT angiogram when seen in GI clinic. Pt was treated with prednisone and colchicine for 9 days in March 2022 for acute gout flare. He subsequently developed abdominal pain, had upper GI series showing duodenitis.   Underwent EGD on 01/25/21:  - LA Grade B reflux esophagitis with no bleeding. - White nummular lesions in esophageal mucosa. Biopsied. - A single gastric polyp. Biopsied. 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 Treated with argon plasma coagulation (APC). - Erythematous duodenopathy. Biopsied. - Nodular mucosa in the duodenal bulb. Biopsied.  Hemoglobin on admission was 8.2, down from 15.3 on 01/09/21.  Hemodynamically stable.  CTA abdomen/pelvis was negative for active bleeding.  Admitted to hospitalist service, GI consulted. Transfused 1 unit pRBC's on admission. Getting IV fluids and on Protonix drip x 72 hours.  EGD on 4/23 was normal.   Colonoscopy on 4/25 was also normal but prep fair. Video capsule endoscopy is planned in outpatient setting.

## 2021-02-10 NOTE — H&P (Signed)
[name], MD [address], [place], [place], [place], [address] [address], [place], [place], [place], [address] Phone: [phone]  Fax: [phone]  Primary Care Physician:  Lavera Guise, MD   Pre-Procedure History & Physical: HPI:  Mark Wyatt is a 70 y.o. male is here for an endoscopy    Past Medical History:  Diagnosis Date  . Diabetes mellitus without complication ([place])   . Gout   . Hyperlipidemia   . Hypertension   . Myocardial infarction ([facility])    mild  . Overactive bladder    [facility]  . 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 ANAL FISTULOTOMY    . CARDIAC CATHETERIZATION    . CORONARY STENT INTERVENTION N/A 02/24/2017   Procedure: Coronary Stent Intervention;  Surgeon: [name], MD;  Location: [place] [facility];  Service: Cardiovascular;  Laterality: N/A;  . ESOPHAGOGASTRODUODENOSCOPY (EGD) WITH PROPOFOL N/A 01/25/2021   Procedure: ESOPHAGOGASTRODUODENOSCOPY (EGD) WITH PROPOFOL;  Surgeon: [name], MD;  Location: [facility];  Service: Endoscopy;  Laterality: N/A;  . LEFT HEART CATH AND CORONARY ANGIOGRAPHY N/A 02/24/2017   Procedure: Left Heart Cath and Coronary Angiography;  Surgeon: [name], MD;  Location: [place] [facility];  Service: Cardiovascular;  Laterality: N/A;    Prior to Admission medications   Medication Sig Start Date End Date Taking? Authorizing Provider  amLODipine (NORVASC) 2.5 MG tablet Take 1 tablet (2.5 mg total) by mouth daily. 09/25/20  Yes Lavera Guise, MD  aspirin 81 MG chewable tablet Chew 1 tablet (81 mg total) by mouth daily. 02/25/17  Yes Bettey Costa, MD  atorvastatin (LIPITOR) 40 MG tablet Take 1 tablet (40 mg total) by mouth daily at 6 PM. 02/06/21  Yes Luiz Ochoa, NP  Blood Glucose Monitoring Suppl (FREESTYLE LITE) w/Device KIT 1 each by Does not apply route daily. Use as directed 09/08/20  Yes Lavera Guise,  MD  glucose blood (FREESTYLE LITE) test strip Use as instructed as needed once a day DX impaired fasting glucose 02/06/21  Yes Luiz Ochoa, NP  Lancets 30G MISC Use as directed once a day DX impaired fasting glucose 09/08/20  Yes Lavera Guise, MD  metFORMIN (GLUCOPHAGE-XR) 500 MG 24 hr tablet Take 1 tablet (500 mg total) by mouth daily with breakfast. 10/09/20  Yes Luiz Ochoa, NP  nitroGLYCERIN (NITROSTAT) 0.4 MG SL tablet Place 1 tablet (0.4 mg total) under the tongue every 5 (five) minutes as needed for chest pain. 02/25/17  Yes Mody, Ulice Bold, MD  pantoprazole (PROTONIX) 40 MG tablet Take 1 tablet (40 mg total) by mouth 2 (two) times daily. 01/25/21 02/24/21 Yes [name], MD  trospium (SANCTURA) 20 MG tablet TAKE 1 TABLET(20 MG) BY MOUTH TWICE DAILY 10/23/20  Yes Vaillancourt, Samantha, PA-C  tadalafil (CIALIS) 20 MG tablet 1 tab 1 hour prior to intercourse 10/06/20   Stoioff, Ronda Fairly, MD  metoprolol tartrate (LOPRESSOR) 25 MG tablet Take 1 tablet (25 mg total) by mouth 2 (two) times daily. 02/25/17 09/06/19  Bettey Costa, MD    Allergies as of 02/09/2021 - Review Complete 02/09/2021  Allergen Reaction Noted  . Penicillins Other (See Comments) 01/16/2011  . Aspirin Other (See Comments) 02/05/2012    Family History  Problem Relation Age of Onset  . Diabetes Mother     Social History   Socioeconomic History  .  Marital status: Married    Spouse name: Not on file  . Number of children: Not on file  . Years of education: Not on file  . Highest education level: Not on file  Occupational History  . Not on file  Tobacco Use  . Smoking status: Former Research scientist (life sciences)  . Smokeless tobacco: Never Used  Vaping Use  . Vaping Use: Never used  Substance and Sexual Activity  . Alcohol use: Yes    Alcohol/week: 6.0 standard drinks    Types: 6 Cans of beer per week    Comment: 1 or 2 with dinner  . Drug use: No  . Sexual activity: Not on file  Other Topics Concern  . Not on file  Social  History Narrative  . Not on file   Social Determinants of Health   Financial Resource Strain: Not on file  Food Insecurity: Not on file  Transportation Needs: Not on file  Physical Activity: Not on file  Stress: Not on file  Social Connections: Not on file  Intimate Partner Violence: Not on file    Review of Systems: See HPI, otherwise negative ROS  Physical Exam: BP 113/60 (BP Location: Left Arm)   Pulse 71   Temp 98.4 F (36.9 C) (Oral)   Resp 15   Ht '5\' 11"'  (1.803 m)   Wt 83.5 kg   SpO2 98%   BMI 25.66 kg/m  General:   Alert,  pleasant and cooperative in NAD Head:  Normocephalic and atraumatic. Neck:  Supple; no masses or thyromegaly. Lungs:  Clear throughout to auscultation, normal respiratory effort.    Heart:  +S1, +S2, Regular rate and rhythm, No edema. Abdomen:  Soft, nontender and nondistended. Normal bowel sounds, without guarding, and without rebound.   Neurologic:  Alert and  oriented x4;  grossly normal neurologically.  Impression/Plan: Mark Wyatt is here for an endoscopy  to be performed for  evaluation of melena     Risks, benefits, limitations, and alternatives regarding endoscopy have been reviewed with the patient.  Questions have been answered.  All parties agreeable.   [name], MD  02/10/2021, 10:40 AM

## 2021-02-10 NOTE — Anesthesia Preprocedure Evaluation (Signed)
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) and + Cardiac Stents  (-) dysrhythmias  Rhythm:regular Rate:Normal     Neuro/Psych negative neurological ROS  negative psych ROS   GI/Hepatic Neg liver ROS, GERD  ,  Endo/Other  diabetes  Renal/GU negative Renal ROS  negative genitourinary   Musculoskeletal   Abdominal   Peds negative pediatric ROS (+)  Hematology  (+) anemia ,   Anesthesia Other Findings Past Medical History: No date: Gout No date: Hyperlipidemia No date: Overactive bladder     Comment:  [facility]  Past Surgical History: No date: ANAL FISTULOTOMY 02/24/2017: CORONARY STENT INTERVENTION; N/A     Comment:  Procedure: Coronary Stent Intervention;  Surgeon:               [name], MD;  Location: [place] [facility];               Service: Cardiovascular;  Laterality: N/A; 02/24/2017: LEFT HEART CATH AND CORONARY ANGIOGRAPHY; N/A     Comment:  Procedure: Left Heart Cath and Coronary Angiography;                Surgeon: [name], MD;  Location: [place] [facility]              [facility];  Service: Cardiovascular;  Laterality: N/A;     Reproductive/Obstetrics negative OB ROS                             Anesthesia Physical  Anesthesia Plan  ASA: III  Anesthesia Plan: General   Post-op Pain Management:    Induction:   PONV Risk Score and Plan: Propofol infusion  and TIVA  Airway Management Planned: Nasal Cannula  Additional Equipment:   Intra-op Plan:   Post-operative Plan:   Informed Consent: I have reviewed the patients History and Physical, chart, labs and discussed the procedure including the risks, benefits and alternatives for the proposed anesthesia with the patient or authorized representative who has indicated his/her understanding and acceptance.     Dental Advisory Given  Plan Discussed with: Anesthesiologist, CRNA and Surgeon  Anesthesia Plan Comments:         Anesthesia Quick Evaluation

## 2021-02-10 NOTE — Anesthesia Postprocedure Evaluation (Signed)
Anesthesia Post Note  Patient: Mark Wyatt  Procedure(s) Performed: ESOPHAGOGASTRODUODENOSCOPY (EGD) (N/A )  Patient location during evaluation: PACU Anesthesia Type: General Level of consciousness: awake and alert and oriented Pain management: pain level controlled Vital Signs Assessment: post-procedure vital signs reviewed and stable Respiratory status: spontaneous breathing Cardiovascular status: blood pressure returned to baseline Anesthetic complications: no   No complications documented.   Last Vitals:  Vitals:   02/10/21 1214 02/10/21 1654  BP: 115/65 126/66  Pulse: 69 68  Resp: 15 15  Temp: 37.9 C 36.8 C  SpO2: 100% 100%    Last Pain:  Vitals:   02/10/21 1654  TempSrc: Oral  PainSc:                  Pamela Maddy

## 2021-02-10 NOTE — Transfer of Care (Signed)
Immediate Anesthesia Transfer of Care Note  Patient: [name]  Procedure(s) Performed: ESOPHAGOGASTRODUODENOSCOPY (EGD) (N/A )  Patient Location: PACU  Anesthesia Type:General  Level of Consciousness: awake, alert  and oriented  Airway & Oxygen Therapy: Patient Spontanous Breathing  Post-op Assessment: Report given to RN and Post -op Vital signs reviewed and stable  Post vital signs: Reviewed and stable  Last Vitals:  Vitals Value Taken Time  BP 104/57 02/10/21 1054  Temp    Pulse 77 02/10/21 1054  Resp 16 02/10/21 1054  SpO2 100 % 02/10/21 1054  Vitals shown include unvalidated device data.  Last Pain:  Vitals:   02/10/21 1054  TempSrc:   PainSc: 0-No pain         Complications: No complications documented.

## 2021-02-10 NOTE — Progress Notes (Signed)
Orders for colonoscopy prep to start now and NPO at midnight.  Per pt colonoscopy scheduled for Monday.  Notified Dr Vicente Males.  Per MD colonoscopy on Monday.

## 2021-02-11 DIAGNOSIS — K625 Hemorrhage of anus and rectum: Secondary | ICD-10-CM | POA: Diagnosis not present

## 2021-02-11 DIAGNOSIS — D649 Anemia, unspecified: Secondary | ICD-10-CM | POA: Diagnosis not present

## 2021-02-11 LAB — HEMOGLOBIN AND HEMATOCRIT, BLOOD
HCT: 24.8 % — ABNORMAL LOW (ref 39.0–52.0)
HCT: 21.2 % — ABNORMAL LOW (ref 39.0–52.0)
HCT: 29.3 % — ABNORMAL LOW (ref 39.0–52.0)
Hemoglobin: 8.2 g/dL — ABNORMAL LOW (ref 13.0–17.0)
Hemoglobin: 7 g/dL — ABNORMAL LOW (ref 13.0–17.0)
Hemoglobin: 9.6 g/dL — ABNORMAL LOW (ref 13.0–17.0)

## 2021-02-11 LAB — PREPARE RBC (CROSSMATCH)

## 2021-02-11 LAB — GLUCOSE, CAPILLARY
Glucose-Capillary: 119 mg/dL — ABNORMAL HIGH (ref 70–99)
Glucose-Capillary: 121 mg/dL — ABNORMAL HIGH (ref 70–99)
Glucose-Capillary: 140 mg/dL — ABNORMAL HIGH (ref 70–99)
Glucose-Capillary: 78 mg/dL (ref 70–99)
Glucose-Capillary: 118 mg/dL — ABNORMAL HIGH (ref 70–99)

## 2021-02-11 LAB — HIV ANTIBODY (ROUTINE TESTING W REFLEX): HIV Screen 4th Generation wRfx: NONREACTIVE

## 2021-02-11 MED ORDER — SODIUM CHLORIDE 0.9% IV SOLUTION: Freq: Once | INTRAVENOUS | Status: AC

## 2021-02-11 NOTE — Progress Notes (Signed)
PROGRESS NOTE    Mark Wyatt   PYK:[id number]  DOB: 11/08/1950  PCP: Lavera Guise, MD    DOA: 02/09/2021 LOS: 2   Brief Narrative   Mark Wyatt is a 70 y.o. male with medical history significant of hypertension, hyperlipidemia, diabetes mellitus, GERD, gout, CAD, stent placement, overactive bladder, AVM of the duodenum, duodenitis, esophagitis, gastric polyp, who presented to the ED on 02/09/21 with intermittent rectal bleeding x 3 days.  Bleeding described as bright red with some clots.  Reported being dizzy and lightheaded, nausea without vomiting.  Recent GI history:  Referred to the ED for CT angiogram when seen in GI clinic. Pt was treated with prednisone and colchicine for 9 days in March 2022 for acute gout flare. He subsequently developed abdominal pain, had upper GI series showing duodenitis.   Underwent EGD on 01/25/21:  - LA Grade B reflux esophagitis with no bleeding. - White nummular lesions in esophageal mucosa. Biopsied. - A single gastric polyp. Biopsied. 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 Treated with argon plasma coagulation (APC). - Erythematous duodenopathy. Biopsied. - Nodular mucosa in the duodenal bulb. Biopsied.  Hemoglobin on admission was 8.2, down from 15.3 on 01/09/21.  Hemodynamically stable.  CTA abdomen/pelvis was negative for active bleeding.  Admitted to hospitalist service, GI consulted. Transfused 1 unit pRBC's on admission. Getting IV fluids and on Protonix drip x 72 hours.  EGD on 4/23 was normal.   Colonoscopy planned for Monday 4/25.    Assessment & Plan   Principal Problem:   Rectal bleeding Active Problems:   Acute blood loss anemia   Ventricular bigeminy   HTN (hypertension)   HLD (hyperlipidemia)   GERD (gastroesophageal reflux disease)   CAD (coronary artery disease)   Symptomatic anemia   GI bleeding with melena - Acute blood loss anemia - Hbg 15.3>>8.2 on admission Symptomatic anemia - pt  dizzy/lightheaded, weak Patient was having black stools which then became maroon, in setting of aspirin use and recent course of prednisone and colchicine for gout with persistent abdominal pain since. --GI following --Protonix drip x 72 hours then BID --Maintenance IV fluids -- Clear liquid diet -- N.p.o. after midnight Sunday -- Colonoscopy on Monday -- PRN Zofran -- Trend hemoglobin -- Transfuse for Hbg < 7.0 or he develops recurrent bleeding -- Maintain 2 large-bore IVs -- Paged GI stat if worsening hemodynamics and bleeding  GERD -currently on IV Protonix.    Ventricular bigeminy -patient asymptomatic, no chest pain, no palpitations.  Troponins checked on admission were normal. --Monitor on telemetry  Hypertension -chronic, stable --Continue home amlodipine --As needed hydralazine  Hyperlipidemia -continue Lipitor.    Coronary artery disease -stable without any chest pain. --Hold aspirin --Continue Lipitor, and PRN SL nitro    Patient BMI: Body mass index is 25.66 kg/m.   DVT prophylaxis: SCDs Start: 02/10/21 0740   Diet:  Diet Orders (From admission, onward)    Start     Ordered   02/12/21 0001  Diet NPO time specified  Diet effective midnight        02/10/21 1725   02/10/21 1050  Diet clear liquid Room service appropriate? Yes; Fluid consistency: Thin  Diet effective now       Question Answer Comment  Room service appropriate? Yes   Fluid consistency: Thin      04 /23/22 1049            Code Status: Full Code    Subjective 02/11/21    Patient  seen this AM.  No BM's or bleeding since admission.  May need to have BM soon.  Bowel prep tonight for colonoscopy tomorrow.  Denies abdominal pain, N/V or other acute complaints.    Disposition Plan & Communication   Status is: Inpatient  Inpatient status appropriate due to significant acute blood loss anemia requiring transfusion and close monitoring, ongoing evaluation not appropriate for outpatient  setting.  Dispo: The patient is from: Home              Anticipated d/c is to: Home              Patient currently not medically stable for discharge   Difficult to place patient no  Family Communication: spoke to patient's daughter by phone this afternoon.   Consults, Procedures, Significant Events   Consultants:   Gastroenterology  Procedures:   EGD on 02/10/2021 -normal, see report  Antimicrobials:  Anti-infectives (From admission, onward)   None        Micro    Objective   Vitals:   02/11/21 0533 02/11/21 0822 02/11/21 1116 02/11/21 1209  BP: (!) 117/59 108/62 (!) 90/53 114/63  Pulse: 75 64 64   Resp: 16 18 16    Temp: 100.2 F (37.9 C) 98 F (36.7 C) 98.3 F (36.8 C)   TempSrc: Oral     SpO2: 95% 97% 100%   Weight:      Height:        Intake/Output Summary (Last 24 hours) at 02/11/2021 1538 Last data filed at 02/11/2021 1451 Gross per 24 hour  Intake 3507.13 ml  Output 2575 ml  Net 932.13 ml   Filed Weights   02/09/21 1205  Weight: 83.5 kg    Physical Exam:  General exam: awake, alert, no acute distress Respiratory system: normal respiratory effort, symmetric chest rise, on room air. Cardiovascular system: RRR, no peripheral edema.   GI - soft and nontender Central nervous system: A&O x4. normal speech Skin: dry, intact, no rashes seen on visualized skin   Labs   Data Reviewed: I have personally reviewed following labs and imaging studies  CBC: Recent Labs  Lab 02/09/21 1705 02/09/21 1948 02/10/21 0252 02/10/21 0914 02/10/21 1513 02/10/21 2027 02/11/21 0322 02/11/21 1402  WBC 8.4 7.7 6.6 5.8 6.2  --   --   --   HGB 8.4* 8.2* 7.9* 7.8* 7.7* 8.1* 8.2* 7.0*  HCT 24.4* 24.5* 23.1* 23.8* 23.3* 24.3* 24.8* 21.2*  MCV 83.0 84.8 84.0 85.9 86.9  --   --   --   PLT 172 155 148* 158 151  --   --   --    Basic Metabolic Panel: Recent Labs  Lab 02/09/21 1222 02/10/21 0914  NA 138 138  K 4.2 3.9  CL 106 108  CO2 24 23  GLUCOSE 133*  126*  BUN 36* 22  CREATININE 0.97 0.85  CALCIUM 8.6* 8.3*   GFR: Estimated Creatinine Clearance: 86.1 mL/min (by C-G formula based on SCr of 0.85 mg/dL). Liver Function Tests: Recent Labs  Lab 02/09/21 1222  AST 18  ALT 16  ALKPHOS 61  BILITOT 0.5  PROT 6.1*  ALBUMIN 3.3*   No results for input(s): LIPASE, AMYLASE in the last 168 hours. No results for input(s): AMMONIA in the last 168 hours. Coagulation Profile: Recent Labs  Lab 02/09/21 1222  INR 1.0   Cardiac Enzymes: No results for input(s): CKTOTAL, CKMB, CKMBINDEX, TROPONINI in the last 168 hours. BNP (last 3 results) No results for  input(s): PROBNP in the last 8760 hours. HbA1C: No results for input(s): HGBA1C in the last 72 hours. CBG: Recent Labs  Lab 02/10/21 1651 02/10/21 2130 02/11/21 0537 02/11/21 0823 02/11/21 1117  GLUCAP 121* 103* 119* 121* 140*   Lipid Profile: No results for input(s): CHOL, HDL, LDLCALC, TRIG, CHOLHDL, LDLDIRECT in the last 72 hours. Thyroid Function Tests: No results for input(s): TSH, T4TOTAL, FREET4, T3FREE, THYROIDAB in the last 72 hours. Anemia Panel: No results for input(s): VITAMINB12, FOLATE, FERRITIN, TIBC, IRON, RETICCTPCT in the last 72 hours. Sepsis Labs: No results for input(s): PROCALCITON, LATICACIDVEN in the last 168 hours.  Recent Results (from the past 240 hour(s))  Resp Panel by RT-PCR (Flu A&B, Covid) Nasopharyngeal Swab     Status: None   Collection Time: 02/09/21  1:07 PM   Specimen: Nasopharyngeal Swab; Nasopharyngeal(NP) swabs in vial transport medium  Result Value Ref Range Status   SARS Coronavirus 2 by RT PCR NEGATIVE NEGATIVE Final    Comment: (NOTE) SARS-CoV-2 target nucleic acids are NOT DETECTED.  The SARS-CoV-2 RNA is generally detectable in upper respiratory specimens during the acute phase of infection. The lowest concentration of SARS-CoV-2 viral copies this assay can detect is 138 copies/mL. A negative result does not preclude  SARS-Cov-2 infection and should not be used as the sole basis for treatment or other patient management decisions. A negative result may occur with  improper specimen collection/handling, submission of specimen other than nasopharyngeal swab, presence of viral mutation(s) within the areas targeted by this assay, and inadequate number of viral copies(<138 copies/mL). A negative result must be combined with clinical observations, patient history, and epidemiological information. The expected result is Negative.  Fact Sheet for Patients:  EntrepreneurPulse.com.au  Fact Sheet for Healthcare Providers:  IncredibleEmployment.be  This test is no t yet approved or cleared by the [place] FDA and  has been authorized for detection and/or diagnosis of SARS-CoV-2 by FDA under an Emergency Use Authorization (EUA). This EUA will remain  in effect (meaning this test can be used) for the duration of the COVID-19 declaration under Section 564(b)(1) of the Act, 21 U.S.C.section 360bbb-3(b)(1), unless the authorization is terminated  or revoked sooner.       Influenza A by PCR NEGATIVE NEGATIVE Final   Influenza B by PCR NEGATIVE NEGATIVE Final    Comment: (NOTE) The Xpert Xpress SARS-CoV-2/FLU/RSV plus assay is intended as an aid in the diagnosis of influenza from Nasopharyngeal swab specimens and should not be used as a sole basis for treatment. Nasal washings and aspirates are unacceptable for Xpert Xpress SARS-CoV-2/FLU/RSV testing.  Fact Sheet for Patients: EntrepreneurPulse.com.au  Fact Sheet for Healthcare Providers: IncredibleEmployment.be  This test is not yet approved or cleared by the [place] FDA and has been authorized for detection and/or diagnosis of SARS-CoV-2 by FDA under an Emergency Use Authorization (EUA). This EUA will remain in effect (meaning this test can be used) for the duration of  the COVID-19 declaration under Section 564(b)(1) of the Act, 21 U.S.C. section 360bbb-3(b)(1), unless the authorization is terminated or revoked.  Performed at [facility], [address], [place], [place] [address]       Imaging Studies   No results found.   Medications   Scheduled Meds: . amLODipine  2.5 mg Oral Daily  . atorvastatin  40 mg Oral q1800  . chlorhexidine  15 mL Mouth Rinse BID  . darifenacin  7.5 mg Oral Daily  . insulin aspart  0-5 Units Subcutaneous QHS  .  insulin aspart  0-9 Units Subcutaneous TID WC  . mouth rinse  15 mL Mouth Rinse q12n4p  . [START ON 02/13/2021] pantoprazole  40 mg Intravenous Q12H  . polyethylene glycol-electrolytes  4,000 mL Oral Once   Continuous Infusions: . sodium chloride 75 mL/hr at 02/11/21 1451  . sodium chloride    . pantoprozole (PROTONIX) infusion 8 mg/hr (02/11/21 0948)       LOS: 2 days    Time spent: 25 minutes with > 50% spent at bedside and in coordination of care.    Ezekiel Slocumb, DO [facility]  02/11/2021, 3:38 PM      If 7PM-7AM, please contact night-coverage. How to contact the [facility] Attending or Consulting provider [place] or covering provider during after hours [place], for this patient?    1. Check the care team in [facility] and look for a) attending/consulting [facility] provider listed and b) the [facility] team listed 2. Log into [email or web address] and use [place]'s universal password to access. If you do not have the password, please contact the hospital operator. 3. Locate the [facility] provider you are looking for under [facility] and page to a number that you can be directly reached. 4. If you still have difficulty reaching the provider, please page the [facility] (Director on Call) for the Hospitalists listed on amion for assistance.

## 2021-02-12 ENCOUNTER — Encounter: Payer: Self-pay | Admitting: Internal Medicine

## 2021-02-12 DIAGNOSIS — D649 Anemia, unspecified: Secondary | ICD-10-CM | POA: Diagnosis not present

## 2021-02-12 DIAGNOSIS — D62 Acute posthemorrhagic anemia: Secondary | ICD-10-CM | POA: Diagnosis not present

## 2021-02-12 DIAGNOSIS — K625 Hemorrhage of anus and rectum: Secondary | ICD-10-CM

## 2021-02-12 LAB — HEMOGLOBIN AND HEMATOCRIT, BLOOD
HCT: 26.3 % — ABNORMAL LOW (ref 39.0–52.0)
HCT: 26.2 % — ABNORMAL LOW (ref 39.0–52.0)
Hemoglobin: 8.8 g/dL — ABNORMAL LOW (ref 13.0–17.0)
Hemoglobin: 8.6 g/dL — ABNORMAL LOW (ref 13.0–17.0)

## 2021-02-12 LAB — BPAM RBC
Blood Product Expiration Date: 202205192359
Blood Product Expiration Date: 202205192359
Blood Product Expiration Date: 202205192359
ISSUE DATE / TIME: 202204221403
ISSUE DATE / TIME: 202204241737
Unit Type and Rh: 6200
Unit Type and Rh: 6200
Unit Type and Rh: 6200

## 2021-02-12 LAB — TYPE AND SCREEN
ABO/RH(D): A POS
Antibody Screen: NEGATIVE
Unit division: 0
Unit division: 0
Unit division: 0

## 2021-02-12 LAB — FERRITIN: Ferritin: 156 ng/mL (ref 24–336)

## 2021-02-12 LAB — IRON AND TIBC
Iron: 26 ug/dL — ABNORMAL LOW (ref 45–182)
Saturation Ratios: 9 % — ABNORMAL LOW (ref 17.9–39.5)
TIBC: 298 ug/dL (ref 250–450)
UIBC: 272 ug/dL

## 2021-02-12 LAB — GLUCOSE, CAPILLARY
Glucose-Capillary: 124 mg/dL — ABNORMAL HIGH (ref 70–99)
Glucose-Capillary: 114 mg/dL — ABNORMAL HIGH (ref 70–99)
Glucose-Capillary: 98 mg/dL (ref 70–99)
Glucose-Capillary: 90 mg/dL (ref 70–99)
Glucose-Capillary: 138 mg/dL — ABNORMAL HIGH (ref 70–99)

## 2021-02-12 LAB — FOLATE: Folate: 15.5 ng/mL (ref >5.9)

## 2021-02-12 MED ORDER — POLYETHYLENE GLYCOL 3350 17 GM/SCOOP PO POWD
1.0000 | Freq: Once | ORAL | Status: AC
  Administered 2021-02-12: 18:00:00 255 g via ORAL
  Filled 2021-02-12: qty 255

## 2021-02-12 MED ORDER — ONDANSETRON HCL 4 MG/2ML IJ SOLN: 4.0000 mg | Freq: Once | INTRAMUSCULAR | Status: DC | PRN

## 2021-02-12 MED ORDER — FENTANYL CITRATE (PF) 100 MCG/2ML IJ SOLN: 25.0000 ug | INTRAMUSCULAR | Status: DC | PRN

## 2021-02-12 NOTE — Progress Notes (Signed)
Colonoscopy has been postponed until tomorrow due to inadequate bowel prep.  Patient reported that his stools are not clear.  His hemoglobin is fairly stable and patient is hemodynamically stable. Patient is passing black stools at this time  Okay with clear liquid diet Repeat bowel prep today, MiraLAX prep ordered Plan to repeat colonoscopy tomorrow If patient has declining hemoglobin or has hematochezia with blood clots, recommend tagged RBC scan.  If he becomes hemodynamically unstable, recommend CT angio  Cephas Darby, MD [address]  [place]  [place], [place] [address]  Main: [phone]  Fax: [phone] Pager: [phone]

## 2021-02-12 NOTE — Anesthesia Preprocedure Evaluation (Signed)
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) and + Cardiac Stents  (-) dysrhythmias  Rhythm:regular Rate:Normal     Neuro/Psych negative neurological ROS  negative psych ROS   GI/Hepatic Neg liver ROS, GERD  ,  Endo/Other  diabetes  Renal/GU negative Renal ROS  negative genitourinary   Musculoskeletal   Abdominal   Peds negative pediatric ROS (+)  Hematology  (+) anemia ,   Anesthesia Other Findings Past Medical History: No date: Gout No date: Hyperlipidemia No date: Overactive bladder     Comment:  [facility]  Past Surgical History: No date: ANAL FISTULOTOMY 02/24/2017: CORONARY STENT INTERVENTION; N/A     Comment:  Procedure: Coronary Stent Intervention;  Surgeon:               Callwood, Dwayne D, MD;  Location: [facility] INVASIVE CV LAB;               Service: Cardiovascular;  Laterality: N/A; 02/24/2017: LEFT HEART CATH AND CORONARY ANGIOGRAPHY; N/A     Comment:  Procedure: Left Heart Cath and Coronary Angiography;                Surgeon: Fath, Kenneth A, MD;  Location: [facility] INVASIVE CV              LAB;  Service: Cardiovascular;  Laterality: N/A;     Reproductive/Obstetrics negative OB ROS                             Anesthesia Physical  Anesthesia Plan  ASA: III  Anesthesia Plan: General   Post-op Pain Management:    Induction:   PONV Risk Score and Plan: Propofol infusion  and TIVA  Airway Management Planned: Nasal Cannula  Additional Equipment:   Intra-op Plan:   Post-operative Plan:   Informed Consent: I have reviewed the patients History and Physical, chart, labs and discussed the procedure including the risks, benefits and alternatives for the proposed anesthesia with the patient or authorized representative who has indicated his/her understanding and acceptance.     Dental Advisory Given  Plan Discussed with: Anesthesiologist, CRNA and Surgeon  Anesthesia Plan Comments:         Anesthesia Quick Evaluation  

## 2021-02-12 NOTE — Progress Notes (Addendum)
PROGRESS NOTE    Mark Wyatt   BSW:[id number]  DOB: 06-22-1951  PCP: Lavera Guise, MD    DOA: 02/09/2021 LOS: 3   Brief Narrative   Mark Wyatt is a 70 y.o. male with medical history significant of hypertension, hyperlipidemia, diabetes mellitus, GERD, gout, CAD, stent placement, overactive bladder, AVM of the duodenum, duodenitis, esophagitis, gastric polyp, who presented to the ED on 02/09/21 with intermittent rectal bleeding x 3 days.  Bleeding described as bright red with some clots.  Reported being dizzy and lightheaded, nausea without vomiting.  Recent GI history:  Referred to the ED for CT angiogram when seen in GI clinic. Pt was treated with prednisone and colchicine for 9 days in March 2022 for acute gout flare. He subsequently developed abdominal pain, had upper GI series showing duodenitis.   Underwent EGD on 01/25/21:  - LA Grade B reflux esophagitis with no bleeding. - White nummular lesions in esophageal mucosa. Biopsied. - A single gastric polyp. Biopsied. 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 Treated with argon plasma coagulation (APC). - Erythematous duodenopathy. Biopsied. - Nodular mucosa in the duodenal bulb. Biopsied.  Hemoglobin on admission was 8.2, down from 15.3 on 01/09/21.  Hemodynamically stable.  CTA abdomen/pelvis was negative for active bleeding.  Admitted to hospitalist service, GI consulted. Transfused 1 unit pRBC's on admission. Getting IV fluids and on Protonix drip x 72 hours.  EGD on 4/23 was normal.   Colonoscopy planned for Monday 4/25.    Assessment & Plan   Principal Problem:   Rectal bleeding Active Problems:   Acute blood loss anemia   Ventricular bigeminy   HTN (hypertension)   HLD (hyperlipidemia)   GERD (gastroesophageal reflux disease)   CAD (coronary artery disease)   Symptomatic anemia   GI bleeding with melena - Acute blood loss anemia - Hbg 15.3>>8.2 on admission Symptomatic anemia - pt  dizzy/lightheaded, weak Patient was having black stools which then became maroon, in setting of aspirin use and recent course of prednisone and colchicine for gout with persistent abdominal pain since. 4/24-25 - seems bleeding recurred, passing black feces vs clots. Transfused 1 unit pRBC's 4/24. -- GI following -- Tagged RBC scan - pending -- IV Protonix BID -- Maintenance IV fluids -- Clear liquid diet -- N.p.o. after midnight Sunday -- Re-try for Colonoscopy tomorrow/Tues -- PRN Zofran -- Trend hemoglobin -- Transfuse for Hbg < 7.0 or he develops recurrent bleeding -- Maintain 2 large-bore IVs -- Paged GI stat if worsening hemodynamics and bleeding  GERD -currently on IV Protonix.    Ventricular bigeminy -patient asymptomatic, no chest pain, no palpitations.  Troponins checked on admission were normal. --Monitor on telemetry  Hypertension -chronic, stable --Continue home amlodipine --As needed hydralazine  Hyperlipidemia -continue Lipitor.    Coronary artery disease -stable without any chest pain. --Hold aspirin --Continue Lipitor, and PRN SL nitro    Patient BMI: Body mass index is 25.66 kg/m.   DVT prophylaxis: SCDs Start: 02/10/21 0740   Diet:  Diet Orders (From admission, onward)    Start     Ordered   02/13/21 0500  Diet NPO time specified Except for: Ice Chips, Sips with Meds  Diet effective 0500 tomorrow       Question Answer Comment  Except for Ice Chips   Except for Sips with Meds      04 /25/22 1623   02/12/21 1506  Diet clear liquid Room service appropriate? Yes; Fluid consistency: Thin  Diet effective now  Question Answer Comment  Room service appropriate? Yes   Fluid consistency: Thin      02/12/21 1505            Code Status: Full Code    Subjective 02/12/21    Patient was unable to have colonoscopy today, was still passing black clots.  He drank full prep.  He denies abdominal pain, N/V or other complaints.  Frustrated  understandably.  Has not had any food since Thursday.   Disposition Plan & Communication   Status is: Inpatient  Inpatient status appropriate due to significant acute blood loss anemia requiring transfusion and close monitoring, ongoing evaluation not appropriate for outpatient setting.  Dispo: The patient is from: Home              Anticipated d/c is to: Home              Patient currently not medically stable for discharge   Difficult to place patient no  Family Communication: spoke to patient's daughter by phone this afternoon.   Consults, Procedures, Significant Events   Consultants:   Gastroenterology  Procedures:   EGD on 02/10/2021 -normal, see report  Antimicrobials:  Anti-infectives (From admission, onward)   None        Micro    Objective   Vitals:   02/12/21 0517 02/12/21 0722 02/12/21 1133 02/12/21 1541  BP: 115/63 118/69 (!) 143/78 136/72  Pulse: 66 63 63 (!) 55  Resp: 16 16 16 16   Temp: 98.3 F (36.8 C) 98.1 F (36.7 C) 98 F (36.7 C) 98.3 F (36.8 C)  TempSrc: Oral Oral Oral Oral  SpO2: 97% 97% 99% 98%  Weight:      Height:        Intake/Output Summary (Last 24 hours) at 02/12/2021 1851 Last data filed at 02/12/2021 1842 Gross per 24 hour  Intake 2208.1 ml  Output 900 ml  Net 1308.1 ml   Filed Weights   02/09/21 1205  Weight: 83.5 kg    Physical Exam:  General exam: awake, alert, no acute distress Respiratory system: normal respiratory effort, symmetric chest rise, on room air.   GI - soft and nontender Central nervous system: A&O x4. normal speech Extremities: moves all, no edema   Labs   Data Reviewed: I have personally reviewed following labs and imaging studies  CBC: Recent Labs  Lab 02/09/21 1705 02/09/21 1948 02/10/21 0252 02/10/21 0914 02/10/21 1513 02/10/21 2027 02/11/21 0322 02/11/21 1402 02/11/21 2221 02/12/21 0513 02/12/21 1249  WBC 8.4 7.7 6.6 5.8 6.2  --   --   --   --   --   --   HGB 8.4* 8.2* 7.9*  7.8* 7.7*   < > 8.2* 7.0* 9.6* 8.8* 8.6*  HCT 24.4* 24.5* 23.1* 23.8* 23.3*   < > 24.8* 21.2* 29.3* 26.3* 26.2*  MCV 83.0 84.8 84.0 85.9 86.9  --   --   --   --   --   --   PLT 172 155 148* 158 151  --   --   --   --   --   --    < > = values in this interval not displayed.   Basic Metabolic Panel: Recent Labs  Lab 02/09/21 1222 02/10/21 0914  NA 138 138  K 4.2 3.9  CL 106 108  CO2 24 23  GLUCOSE 133* 126*  BUN 36* 22  CREATININE 0.97 0.85  CALCIUM 8.6* 8.3*   GFR: Estimated Creatinine Clearance: 86.1  mL/min (by C-G formula based on SCr of 0.85 mg/dL). Liver Function Tests: Recent Labs  Lab 02/09/21 1222  AST 18  ALT 16  ALKPHOS 61  BILITOT 0.5  PROT 6.1*  ALBUMIN 3.3*   No results for input(s): LIPASE, AMYLASE in the last 168 hours. No results for input(s): AMMONIA in the last 168 hours. Coagulation Profile: Recent Labs  Lab 02/09/21 1222  INR 1.0   Cardiac Enzymes: No results for input(s): CKTOTAL, CKMB, CKMBINDEX, TROPONINI in the last 168 hours. BNP (last 3 results) No results for input(s): PROBNP in the last 8760 hours. HbA1C: No results for input(s): HGBA1C in the last 72 hours. CBG: Recent Labs  Lab 02/11/21 1616 02/11/21 2055 02/12/21 0725 02/12/21 1135 02/12/21 1543  GLUCAP 78 118* 114* 98 90   Lipid Profile: No results for input(s): CHOL, HDL, LDLCALC, TRIG, CHOLHDL, LDLDIRECT in the last 72 hours. Thyroid Function Tests: No results for input(s): TSH, T4TOTAL, FREET4, T3FREE, THYROIDAB in the last 72 hours. Anemia Panel: No results for input(s): VITAMINB12, FOLATE, FERRITIN, TIBC, IRON, RETICCTPCT in the last 72 hours. Sepsis Labs: No results for input(s): PROCALCITON, LATICACIDVEN in the last 168 hours.  Recent Results (from the past 240 hour(s))  Resp Panel by RT-PCR (Flu A&B, Covid) Nasopharyngeal Swab     Status: None   Collection Time: 02/09/21  1:07 PM   Specimen: Nasopharyngeal Swab; Nasopharyngeal(NP) swabs in vial transport  medium  Result Value Ref Range Status   SARS Coronavirus 2 by RT PCR NEGATIVE NEGATIVE Final    Comment: (NOTE) SARS-CoV-2 target nucleic acids are NOT DETECTED.  The SARS-CoV-2 RNA is generally detectable in upper respiratory specimens during the acute phase of infection. The lowest concentration of SARS-CoV-2 viral copies this assay can detect is 138 copies/mL. A negative result does not preclude SARS-Cov-2 infection and should not be used as the sole basis for treatment or other patient management decisions. A negative result may occur with  improper specimen collection/handling, submission of specimen other than nasopharyngeal swab, presence of viral mutation(s) within the areas targeted by this assay, and inadequate number of viral copies(<138 copies/mL). A negative result must be combined with clinical observations, patient history, and epidemiological information. The expected result is Negative.  Fact Sheet for Patients:  EntrepreneurPulse.com.au  Fact Sheet for Healthcare Providers:  IncredibleEmployment.be  This test is no t yet approved or cleared by the [place] FDA and  has been authorized for detection and/or diagnosis of SARS-CoV-2 by FDA under an Emergency Use Authorization (EUA). This EUA will remain  in effect (meaning this test can be used) for the duration of the COVID-19 declaration under Section 564(b)(1) of the Act, 21 U.S.C.section 360bbb-3(b)(1), unless the authorization is terminated  or revoked sooner.       Influenza A by PCR NEGATIVE NEGATIVE Final   Influenza B by PCR NEGATIVE NEGATIVE Final    Comment: (NOTE) The Xpert Xpress SARS-CoV-2/FLU/RSV plus assay is intended as an aid in the diagnosis of influenza from Nasopharyngeal swab specimens and should not be used as a sole basis for treatment. Nasal washings and aspirates are unacceptable for Xpert Xpress SARS-CoV-2/FLU/RSV testing.  Fact Sheet for  Patients: EntrepreneurPulse.com.au  Fact Sheet for Healthcare Providers: IncredibleEmployment.be  This test is not yet approved or cleared by the [place] FDA and has been authorized for detection and/or diagnosis of SARS-CoV-2 by FDA under an Emergency Use Authorization (EUA). This EUA will remain in effect (meaning this test can be used) for the duration  of the COVID-19 declaration under Section 564(b)(1) of the Act, 21 U.S.C. section 360bbb-3(b)(1), unless the authorization is terminated or revoked.  Performed at [facility], [address] [place], [place] [address]       Imaging Studies   No results found.   Medications   Scheduled Meds: . amLODipine  2.5 mg Oral Daily  . atorvastatin  40 mg Oral q1800  . chlorhexidine  15 mL Mouth Rinse BID  . darifenacin  7.5 mg Oral Daily  . insulin aspart  0-5 Units Subcutaneous QHS  . insulin aspart  0-9 Units Subcutaneous TID WC  . mouth rinse  15 mL Mouth Rinse q12n4p  . [START ON 02/13/2021] pantoprazole  40 mg Intravenous Q12H   Continuous Infusions: . sodium chloride 75 mL/hr at 02/12/21 0841  . sodium chloride         LOS: 3 days    Time spent: 25 minutes with > 50% spent at bedside and in coordination of care.    Ezekiel Slocumb, DO [facility]  02/12/2021, 6:51 PM      If 7PM-7AM, please contact night-coverage. How to contact the [facility] Attending or Consulting provider [place] or covering provider during after hours [place], for this patient?    1. Check the care team in [facility] and look for a) attending/consulting [facility] provider listed and b) the [facility] team listed 2. Log into [email or web address] and use [facility]'s universal password to access. If you do not have the password, please contact the hospital operator. 3. Locate the [facility] provider you are looking for under [facility] and page to a number that you can be directly reached. 4. If you still have  difficulty reaching the provider, please page the [facility] (Director on Call) for the Hospitalists listed on amion for assistance.

## 2021-02-12 NOTE — Progress Notes (Addendum)
Mark Darby, MD [address]  [place]  [place], [place] [address]  Main: [phone]  Fax: [phone] Pager: [phone]   Subjective: Patient reports having black stools.  Hemoglobin is fairly stable.  He could not undergo colonoscopy today as his stools were not clear.  Patient denies any abdominal pain, nausea, vomiting, chest pain or shortness of breath.  Patient is hemodynamically stable   Objective: Vital signs in last 24 hours: Vitals:   02/12/21 0517 02/12/21 0722 02/12/21 1133 02/12/21 1541  BP: 115/63 118/69 (!) 143/78 136/72  Pulse: 66 63 63 (!) 55  Resp: _0 Temp: 98.3 F (36.8 C) 98.1 F (36.7 C) 98 F (36.7 C) 98.3 F (36.8 C)  TempSrc: Oral Oral Oral Oral  SpO2: 97% 97% 99% 98%  Weight:      Height:       Weight change:   Intake/Output Summary (Last 24 hours) at 02/12/2021 1949 Last data filed at 02/12/2021 1842 Gross per 24 hour  Intake 1608.1 ml  Output 125 ml  Net 1483.1 ml     Exam: Heart:: Regular rate and rhythm, S1S2 present or without murmur or extra heart sounds Lungs: normal and clear to auscultation Abdomen: soft, nontender, normal bowel sounds   Lab Results: CBC Latest Ref Rng & Units 02/12/2021 02/12/2021 02/11/2021  WBC 4.0 - 10.5 K/uL - - -  Hemoglobin 13.0 - 17.0 g/dL 8.6(L) 8.8(L) 9.6(L)  Hematocrit 39.0 - 52.0 % 26.2(L) 26.3(L) 29.3(L)  Platelets 150 - 400 K/uL - - -   CMP Latest Ref Rng & Units 02/10/2021 02/09/2021 01/09/2021  Glucose 70 - 99 mg/dL 126(H) 133(H) 115(H)  BUN 8 - 23 mg/dL 22 36(H) 12  Creatinine 0.61 - 1.24 mg/dL 0.85 0.97 1.01  Sodium 135 - 145 mmol/L 138 138 134  Potassium 3.5 - 5.1 mmol/L 3.9 4.2 4.0  Chloride 98 - 111 mmol/L 108 106 96  CO2 22 - 32 mmol/L _1 Calcium 8.9 - 10.3 mg/dL 8.3(L) 8.6(L) 8.9  Total Protein 6.5 - 8.1 g/dL - 6.1(L) 7.1  Total Bilirubin 0.3 - 1.2 mg/dL - 0.5 1.6(H)  Alkaline Phos 38 - 126 U/L - 61 111  AST 15 - 41 U/L - 18 18  ALT 0 - 44 U/L - 16  18    Micro Results: Recent Results (from the past 240 hour(s))  Resp Panel by RT-PCR (Flu A&B, Covid) Nasopharyngeal Swab     Status: None   Collection Time: 02/09/21  1:07 PM   Specimen: Nasopharyngeal Swab; Nasopharyngeal(NP) swabs in vial transport medium  Result Value Ref Range Status   SARS Coronavirus 2 by RT PCR NEGATIVE NEGATIVE Final    Comment: (NOTE) SARS-CoV-2 target nucleic acids are NOT DETECTED.  The SARS-CoV-2 RNA is generally detectable in upper respiratory specimens during the acute phase of infection. The lowest concentration of SARS-CoV-2 viral copies this assay can detect is 138 copies/mL. A negative result does not preclude SARS-Cov-2 infection and should not be used as the sole basis for treatment or other patient management decisions. A negative result may occur with  improper specimen collection/handling, submission of specimen other than nasopharyngeal swab, presence of viral mutation(s) within the areas targeted by this assay, and inadequate number of viral copies(<138 copies/mL). A negative result must be combined with clinical observations, patient history, and epidemiological information. The expected result is Negative.  Fact Sheet for Patients:  EntrepreneurPulse.com.au  Fact Sheet for Healthcare Providers:  IncredibleEmployment.be  This test is no t yet approved or cleared by the [place] and  has been authorized for detection and/or diagnosis of SARS-CoV-2 by FDA under an Emergency Use Authorization (EUA). This EUA will remain  in effect (meaning this test can be used) for the duration of the COVID-19 declaration under Section 564(b)(1) of the Act, 21 U.S.C.section 360bbb-3(b)(1), unless the authorization is terminated  or revoked sooner.       Influenza A by PCR NEGATIVE NEGATIVE Final   Influenza B by PCR NEGATIVE NEGATIVE Final    Comment: (NOTE) The Xpert Xpress SARS-CoV-2/FLU/RSV plus  assay is intended as an aid in the diagnosis of influenza from Nasopharyngeal swab specimens and should not be used as a sole basis for treatment. Nasal washings and aspirates are unacceptable for Xpert Xpress SARS-CoV-2/FLU/RSV testing.  Fact Sheet for Patients: EntrepreneurPulse.com.au  Fact Sheet for Healthcare Providers: IncredibleEmployment.be  This test is not yet approved or cleared by the [place] FDA and has been authorized for detection and/or diagnosis of SARS-CoV-2 by FDA under an Emergency Use Authorization (EUA). This EUA will remain in effect (meaning this test can be used) for the duration of the COVID-19 declaration under Section 564(b)(1) of the Act, 21 U.S.C. section 360bbb-3(b)(1), unless the authorization is terminated or revoked.  Performed at [facility], [address], [place], [place] [address]    Studies/Results: No results found. Medications:  I have reviewed the patient's current medications. Prior to Admission:  Medications Prior to Admission  Medication Sig Dispense Refill Last Dose  . amLODipine (NORVASC) 2.5 MG tablet Take 1 tablet (2.5 mg total) by mouth daily. 90 tablet 3 02/09/2021 at Unknown time  . aspirin 81 MG chewable tablet Chew 1 tablet (81 mg total) by mouth daily.   02/08/2021 at Unknown time  . atorvastatin (LIPITOR) 40 MG tablet Take 1 tablet (40 mg total) by mouth daily at 6 PM. 90 tablet 1 02/08/2021 at Unknown time  . Blood Glucose Monitoring Suppl (FREESTYLE LITE) w/Device KIT 1 each by Does not apply route daily. Use as directed 1 kit 0 unknown at unknown  . glucose blood (FREESTYLE LITE) test strip Use as instructed as needed once a day DX impaired fasting glucose 100 each 1 unknown at unknown  . Lancets 30G MISC Use as directed once a day DX impaired fasting glucose 100 each 1 unknown at unknown  . metFORMIN (GLUCOPHAGE-XR) 500 MG 24 hr tablet Take 1 tablet (500 mg total) by  mouth daily with breakfast. 90 tablet 1 02/09/2021 at Unknown time  . nitroGLYCERIN (NITROSTAT) 0.4 MG SL tablet Place 1 tablet (0.4 mg total) under the tongue every 5 (five) minutes as needed for chest pain. 30 tablet 0 prn at prn  . pantoprazole (PROTONIX) 40 MG tablet Take 1 tablet (40 mg total) by mouth 2 (two) times daily. 60 tablet 0 02/09/2021 at Unknown time  . trospium (SANCTURA) 20 MG tablet TAKE 1 TABLET(20 MG) BY MOUTH TWICE DAILY 90 tablet 1 02/09/2021 at Unknown time  . tadalafil (CIALIS) 20 MG tablet 1 tab 1 hour prior to intercourse 30 tablet 0 unknown at unknown   Scheduled: . amLODipine  2.5 mg Oral Daily  . atorvastatin  40 mg Oral q1800  . chlorhexidine  15 mL Mouth Rinse BID  . darifenacin  7.5 mg Oral Daily  . insulin aspart  0-5 Units Subcutaneous QHS  . insulin aspart  0-9 Units Subcutaneous TID WC  . mouth rinse  15 mL  Mouth Rinse q12n4p  . [START ON 02/13/2021] pantoprazole  40 mg Intravenous Q12H   Continuous: . sodium chloride 75 mL/hr at 02/12/21 0841  . sodium chloride     UEB:[id number], hydrALAZINE, nitroGLYCERIN, ondansetron (ZOFRAN) IV Anti-infectives (From admission, onward)   None     Scheduled Meds: . amLODipine  2.5 mg Oral Daily  . atorvastatin  40 mg Oral q1800  . chlorhexidine  15 mL Mouth Rinse BID  . darifenacin  7.5 mg Oral Daily  . insulin aspart  0-5 Units Subcutaneous QHS  . insulin aspart  0-9 Units Subcutaneous TID WC  . mouth rinse  15 mL Mouth Rinse q12n4p  . [START ON 02/13/2021] pantoprazole  40 mg Intravenous Q12H   Continuous Infusions: . sodium chloride 75 mL/hr at 02/12/21 0841  . sodium chloride     PRN Meds:.acetaminophen, hydrALAZINE, nitroGLYCERIN, ondansetron (ZOFRAN) IV   Assessment: Principal Problem:   Rectal bleeding Active Problems:   Acute blood loss anemia   Ventricular bigeminy   HTN (hypertension)   HLD (hyperlipidemia)   GERD (gastroesophageal reflux disease)   CAD (coronary artery disease)    Symptomatic anemia   Plan: Hematochezia/melena: Repeat bowel prep today Continue clear liquid diet Plan for colonoscopy tomorrow Monitor CBC every 12 hours Check iron panel, B12 and folate levels due to acute blood loss anemia N.p.o. effective 5a.m tomorrow.   LOS: 3 days   Mark Wyatt 02/12/2021, 7:49 PM

## 2021-02-12 NOTE — Progress Notes (Signed)
Pt s tele discontinue as dr order

## 2021-02-13 ENCOUNTER — Telehealth: Payer: Self-pay

## 2021-02-13 ENCOUNTER — Encounter
Admission: EM | Disposition: A | Discharge status: Home / Self Care | Payer: Self-pay | Source: Home / Self Care | Attending: Internal Medicine

## 2021-02-13 ENCOUNTER — Encounter: Payer: Self-pay | Admitting: Internal Medicine

## 2021-02-13 ENCOUNTER — Encounter: Payer: Self-pay | Admitting: Hospice and Palliative Medicine

## 2021-02-13 ENCOUNTER — Inpatient Hospital Stay: Payer: Medicare Other | Admitting: Anesthesiology

## 2021-02-13 DIAGNOSIS — K625 Hemorrhage of anus and rectum: Secondary | ICD-10-CM | POA: Diagnosis not present

## 2021-02-13 HISTORY — PX: COLONOSCOPY WITH PROPOFOL: SHX5780

## 2021-02-13 LAB — BASIC METABOLIC PANEL
Anion gap: 6 (ref 5–15)
BUN: <5 mg/dL — ABNORMAL LOW (ref 8–23)
CO2: 24 mmol/L (ref 22–32)
Calcium: 8.1 mg/dL — ABNORMAL LOW (ref 8.9–10.3)
Chloride: 110 mmol/L (ref 98–111)
Creatinine, Ser: 0.75 mg/dL (ref 0.61–1.24)
GFR, Estimated: >60 mL/min (ref >60)
Glucose, Bld: 100 mg/dL — ABNORMAL HIGH (ref 70–99)
Potassium: 3.4 mmol/L — ABNORMAL LOW (ref 3.5–5.1)
Sodium: 140 mmol/L (ref 135–145)

## 2021-02-13 LAB — HEMOGLOBIN AND HEMATOCRIT, BLOOD
HCT: 26 % — ABNORMAL LOW (ref 39.0–52.0)
HCT: 28.6 % — ABNORMAL LOW (ref 39.0–52.0)
Hemoglobin: 8.7 g/dL — ABNORMAL LOW (ref 13.0–17.0)
Hemoglobin: 9.5 g/dL — ABNORMAL LOW (ref 13.0–17.0)

## 2021-02-13 LAB — GLUCOSE, CAPILLARY
Glucose-Capillary: 107 mg/dL — ABNORMAL HIGH (ref 70–99)
Glucose-Capillary: 93 mg/dL (ref 70–99)

## 2021-02-13 LAB — VITAMIN B12: Vitamin B-12: 330 pg/mL (ref 180–914)

## 2021-02-13 SURGERY — COLONOSCOPY WITH PROPOFOL
Anesthesia: General

## 2021-02-13 MED ORDER — POTASSIUM CHLORIDE CRYS ER 20 MEQ PO TBCR: 40.0000 meq | EXTENDED_RELEASE_TABLET | Freq: Once | ORAL | Status: DC

## 2021-02-13 MED ORDER — PROPOFOL 500 MG/50ML IV EMUL
INTRAVENOUS | Status: AC
  Filled 2021-02-13: qty 50

## 2021-02-13 MED ORDER — PROPOFOL 500 MG/50ML IV EMUL
INTRAVENOUS | Status: DC | PRN
  Administered 2021-02-13: 120 ug/kg/min via INTRAVENOUS

## 2021-02-13 MED ORDER — SODIUM CHLORIDE 0.9 % IV SOLN: INTRAVENOUS | Status: DC

## 2021-02-13 NOTE — Op Note (Signed)
[facility] Gastroenterology Patient Name: Mark Wyatt Procedure Date: 02/13/2021 10:34 AM MRN: [id number] Account #: [id number] Date of Birth: 1951/09/07 Admit Type: Outpatient Age: 70 Room: [facility] ENDO ROOM 3 Gender: Male Note Status: Finalized Procedure:             Colonoscopy Indications:           Hematochezia, Evaluation of unexplained GI bleeding                         presenting with Melena (upper GI source has been                         excluded), Acute post hemorrhagic anemia Providers:             [name] MD, MD Referring MD:          Lavera Guise, MD (Referring MD) Medicines:             General Anesthesia Complications:         No immediate complications. Estimated blood loss: None. Procedure:             Pre-Anesthesia Assessment:                        - Prior to the procedure, a History and Physical was                         performed, and patient medications and allergies were                         reviewed. The patient is competent. The risks and                         benefits of the procedure and the sedation options and                         risks were discussed with the patient. All questions                         were answered and informed consent was obtained.                         Patient identification and proposed procedure were                         verified by the physician, the nurse, the                         anesthesiologist, the anesthetist and the technician                         in the pre-procedure area in the procedure room in the                         endoscopy suite. Mental Status Examination: alert and                         oriented. Airway Examination: normal oropharyngeal  airway and neck mobility. Respiratory Examination:                         clear to auscultation. CV Examination: normal.                         Prophylactic Antibiotics: The patient does not  require                         prophylactic antibiotics. Prior Anticoagulants: The                         patient has taken no previous anticoagulant or                         antiplatelet agents. ASA Grade Assessment: III - A                         patient with severe systemic disease. After reviewing                         the risks and benefits, the patient was deemed in                         satisfactory condition to undergo the procedure. The                         anesthesia plan was to use general anesthesia.                         Immediately prior to administration of medications,                         the patient was re-assessed for adequacy to receive                         sedatives. The heart rate, respiratory rate, oxygen                         saturations, blood pressure, adequacy of pulmonary                         ventilation, and response to care were monitored                         throughout the procedure. The physical status of the                         patient was re-assessed after the procedure.                        After obtaining informed consent, the colonoscope was                         passed under direct vision. Throughout the procedure,                         the patient's blood pressure, pulse, and oxygen  saturations were monitored continuously. The                         Colonoscope was introduced through the anus and                         advanced to the the cecum, identified by appendiceal                         orifice and ileocecal valve. The colonoscopy was                         performed without difficulty. The patient tolerated                         the procedure well. The quality of the bowel                         preparation was fair. Findings:      The perianal and digital rectal examinations were normal. Pertinent       negatives include normal sphincter tone and no palpable rectal  lesions.      The entire examined colon appeared normal.      The retroflexed view of the distal rectum and anal verge was normal and       showed no anal or rectal abnormalities.      Moderate quantities of liquid yellow stool was found in the entire colon. Impression:            - Preparation of the colon was fair.                        - The entire examined colon is normal.                        - The distal rectum and anal verge are normal on                         retroflexion view.                        - Stool in the entire examined colon.                        - No specimens collected. Recommendation:        - Return patient to hospital ward for possible                         discharge same day.                        - Resume regular diet today.                        - To visualize the small bowel, perform video capsule                         endoscopy at appointment to be scheduled as outpt.                        -  Return to GI office with Dr Bonna Gains at appointment                         to be scheduled. Procedure Code(s):     --- Professional ---                        [phone], Colonoscopy, flexible; diagnostic, including                         collection of specimen(s) by brushing or washing, when                         performed (separate procedure) Diagnosis Code(s):     --- Professional ---                        K92.1, Melena (includes Hematochezia)                        D62, Acute posthemorrhagic anemia CPT copyright 2019 [employer]. All rights reserved. The codes documented in this report are preliminary and upon coder review may  be revised to meet current compliance requirements. Dr. Ulyess Mort [name] MD, MD 02/13/2021 10:58:38 AM This report has been signed electronically. Number of Addenda: 0 Note Initiated On: 02/13/2021 10:34 AM Scope Withdrawal Time: 0 hours 3 minutes 29 seconds  Total Procedure Duration: 0 hours  8 minutes 15 seconds  Estimated Blood Loss:  Estimated blood loss: none.      [facility]

## 2021-02-13 NOTE — Discharge Summary (Addendum)
Physician Discharge Summary  Mark Wyatt ENI:[id number] DOB: 03/28/1951 DOA: 02/09/2021  PCP: Lavera Guise, MD  Admit date: 02/09/2021 Discharge date: 02/13/2021  Admitted From: home Disposition:  home  Recommendations for Outpatient Follow-up:  1. Follow up with PCP in 1-2 weeks 2. Please obtain BMP/CBC in one week 3. Please follow up GI (Dr. Bonna Gains) in clinic - to be scheduled by GI service  Home Health: No  Equipment/Devices: None   Discharge Condition: Stable  CODE STATUS: Full  Diet recommendation: Heart Healthy / Carb Modified     Discharge Diagnoses: Principal Problem:   Rectal bleeding Active Problems:   Acute blood loss anemia   Ventricular bigeminy   HTN (hypertension)   HLD (hyperlipidemia)   GERD (gastroesophageal reflux disease)   CAD (coronary artery disease)   Symptomatic anemia    Summary of HPI and Hospital Course:  Mark Wyatt is a 70 y.o. male with medical history significant of hypertension, hyperlipidemia, diabetes mellitus, GERD, gout, CAD, stent placement, overactive bladder, AVM of the duodenum, duodenitis, esophagitis, gastric polyp, who presented to the ED on 02/09/21 with intermittent rectal bleeding x 3 days.  Bleeding described as bright red with some clots.  Reported being dizzy and lightheaded, nausea without vomiting.  Recent GI history:  Referred to the ED for CT angiogram when seen in GI clinic. Pt was treated with prednisone and colchicine for 9 days in March 2022 for acute gout flare. He subsequently developed abdominal pain, had upper GI series showing duodenitis.   Underwent EGD on 01/25/21:  - LA Grade B reflux esophagitis with no bleeding. - White nummular lesions in esophageal mucosa. Biopsied. - A single gastric polyp. Biopsied. 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 Treated with argon plasma coagulation (APC). - Erythematous duodenopathy. Biopsied. - Nodular mucosa in the duodenal bulb. Biopsied.  Hemoglobin on  admission was 8.2, down from 15.3 on 01/09/21.  Hemodynamically stable.  CTA abdomen/pelvis was negative for active bleeding.  Admitted to hospitalist service, GI consulted. Transfused 1 unit pRBC's on admission. Getting IV fluids and on Protonix drip x 72 hours.  EGD on 4/23 was normal.   Colonoscopy on 4/25 was also normal but prep fair. Video capsule endoscopy is planned in outpatient setting.        GI bleeding with melena - Acute blood loss anemia - Hbg 15.3>>8.2 on admission Symptomatic anemia - pt dizzy/lightheaded, weak Patient was having black stools which then became maroon, in setting of aspirin use and recent course of prednisone and colchicine for gout with persistent abdominal pain since. 4/24-25 - ?recurrent bleeding passing black feces vs clots. Transfused 1 unit pRBC's 4/24. Otherwise course as outlined above. --Follow up with GI in clinic for capsule study. --Hbg stable and no further bleeding.   --Repeat CBC within a week or sooner if dizzy/lightheaded again (as was prior to admission)   GERD - was on Protonix drip x 72 hours, then IV Protonix BID.  Continue oral twice daily on dc.    Ventricular bigeminy -patient asymptomatic, no chest pain, no palpitations.  Troponins checked on admission were normal.  No significant events on telemetry.  Tele d/c'd at pt's request.  Hypertension -chronic, stable --Continue amlodipine  Hyperlipidemia -continue Lipitor.    Coronary artery disease -stable without any chest pain. --Aspirin held for procedures, resume. --Continue Lipitor, and PRN SL nitro    Discharge Instructions   Discharge Instructions    Call MD for:   Complete by: As directed  Recurrent red or dark/maroon or black stools.   Call MD for:  extreme fatigue   Complete by: As directed    Call MD for:  persistant dizziness or light-headedness   Complete by: As directed    Call MD for:  persistant nausea and vomiting   Complete by: As directed     Call MD for:  severe uncontrolled pain   Complete by: As directed    Call MD for:  temperature >100.4   Complete by: As directed    Diet - low sodium heart healthy   Complete by: As directed    Discharge instructions   Complete by: As directed    If you start feeling dizzy and lightheaded again, that is a sign your Hemoglobin might be low due to bleeding. You should call your doctor, or go to urgent care or the ER to have labs checked.  Follow up with GI in clinic for video capsule endoscopy to evaluate the small intestine for source of bleeding.   Increase activity slowly   Complete by: As directed      Allergies as of 02/13/2021      Reactions   Penicillins Other (See Comments)   Headache Headache   Aspirin Other (See Comments)   02/05/2020-pt has been taking baby aspirin before bed and tolerating well.      Medication List    STOP taking these medications   aspirin 81 MG chewable tablet     TAKE these medications   amLODipine 2.5 MG tablet Commonly known as: NORVASC Take 1 tablet (2.5 mg total) by mouth daily.   atorvastatin 40 MG tablet Commonly known as: LIPITOR Take 1 tablet (40 mg total) by mouth daily at 6 PM.   FREESTYLE LITE test strip Generic drug: glucose blood Use as instructed as needed once a day DX impaired fasting glucose   FreeStyle Lite w/Device Kit 1 each by Does not apply route daily. Use as directed   Lancets 30G Misc Use as directed once a day DX impaired fasting glucose   metFORMIN 500 MG 24 hr tablet Commonly known as: GLUCOPHAGE-XR Take 1 tablet (500 mg total) by mouth daily with breakfast.   nitroGLYCERIN 0.4 MG SL tablet Commonly known as: NITROSTAT Place 1 tablet (0.4 mg total) under the tongue every 5 (five) minutes as needed for chest pain.   pantoprazole 40 MG tablet Commonly known as: PROTONIX Take 1 tablet (40 mg total) by mouth 2 (two) times daily.   tadalafil 20 MG tablet Commonly known as: CIALIS 1 tab 1 hour prior  to intercourse   trospium 20 MG tablet Commonly known as: SANCTURA TAKE 1 TABLET(20 MG) BY MOUTH TWICE DAILY       Allergies  Allergen Reactions  . Penicillins Other (See Comments)    Headache  Headache  . Aspirin Other (See Comments)    02/05/2020-pt has been taking baby aspirin before bed and tolerating well.     If you experience worsening of your admission symptoms, develop shortness of breath, life threatening emergency, suicidal or homicidal thoughts you must seek medical attention immediately by calling 911 or calling your MD immediately  if symptoms less severe.    Please note   You were cared for by a hospitalist during your hospital stay. If you have any questions about your discharge medications or the care you received while you were in the hospital after you are discharged, you can call the unit and asked to speak with the hospitalist on  call if the hospitalist that took care of you is not available. Once you are discharged, your primary care physician will handle any further medical issues. Please note that NO REFILLS for any discharge medications will be authorized once you are discharged, as it is imperative that you return to your primary care physician (or establish a relationship with a primary care physician if you do not have one) for your aftercare needs so that they can reassess your need for medications and monitor your lab values.   Consultations:  GI   Procedures/Studies: CT Angio Abd/Pel W and/or Wo Contrast  Result Date: 02/09/2021 CLINICAL DATA:  70 year old with rectal bleeding. EXAM: CTA ABDOMEN AND PELVIS WITHOUT AND WITH CONTRAST TECHNIQUE: Multidetector CT imaging of the abdomen and pelvis was performed using the standard protocol during bolus administration of intravenous contrast. Multiplanar reconstructed images and MIPs were obtained and reviewed to evaluate the vascular anatomy. CONTRAST:  153m OMNIPAQUE IOHEXOL 350 MG/ML SOLN COMPARISON:   08/27/2019 FINDINGS: VASCULAR Aorta: Atherosclerotic disease in the abdominal aorta without aneurysm, dissection or significant stenosis. Celiac: Minimal narrowing at the origin of the celiac trunk. Main branch vessels are patent. Negative for aneurysm or dissection. SMA: Patent without evidence of aneurysm, dissection, vasculitis or significant stenosis. Renals: Single right renal artery is patent with atherosclerotic disease but no significant stenosis. Main left renal artery is patent without significant stenosis. There are 3 small accessory left renal arteries. IMA: Patent without evidence of aneurysm, dissection, vasculitis or significant stenosis. Inflow: Atherosclerotic disease without aneurysm, dissection or significant stenosis. Common, internal external iliac arteries are patent bilaterally. Proximal Outflow: Proximal femoral arteries are patent bilaterally. Veins: Hepatic veins are patent. Portal venous system is patent. Normal appearance of the IVC and renal veins. Review of the MIP images confirms the above findings. NON-VASCULAR Lower chest: Lung bases are clear without pleural effusions. Coronary artery calcifications. Hepatobiliary: Normal appearance of the liver and gallbladder. No suspicious hepatic lesions. No biliary dilatation. Pancreas: Unremarkable. No pancreatic ductal dilatation or surrounding inflammatory changes. Spleen: Normal in size without focal abnormality. Adrenals/Urinary Tract: Normal appearance of the adrenal glands. There is a large exophytic cyst adjacent to the right adrenal gland. Bilateral renal cysts. No hydronephrosis. No suspicious renal lesions. Normal appearance of the urinary bladder. No kidney stones. Stomach/Bowel: Stomach is within normal limits. No evidence of bowel wall thickening, distention, or inflammatory changes. No evidence for intraluminal contrast or active GI bleeding. Lymphatic: No lymph node enlargement in the abdomen or pelvis. Reproductive: Enlarged  prostate. Other: Negative for ascites. Musculoskeletal: No acute bone abnormality. IMPRESSION: VASCULAR 1. No active GI bleeding. 2. Aortic Atherosclerosis (ICD10-I70.0). Coronary artery calcifications. NON-VASCULAR 1. No acute abnormality in the abdomen or pelvis. 2. Bilateral renal cysts. 3. Prostate enlargement. Electronically Signed   By: AMarkus DaftM.D.   On: 02/09/2021 14:47      EGD  Colonoscopy   Subjective: Pt seen before coloscopy.  Says stool almost watery clear, still little black flecks.  No abdominal pain, no bleeding or new black stool.  No acute complaints.    Discharge Exam: Vitals:   02/13/21 1130 02/13/21 1206  BP: 112/65 (!) 157/67  Pulse: (!) 54 (!) 58  Resp: 15 17  Temp:  97.9 F (36.6 C)  SpO2: (!) 89% 100%   Vitals:   02/13/21 1110 02/13/21 1120 02/13/21 1130 02/13/21 1206  BP: (!) 91/43 (!) 94/35 112/65 (!) 157/67  Pulse: (!) 56 (!) 57 (!) 54 (!) 58  Resp: 18 18 15  17  Temp:    97.9 F (36.6 C)  TempSrc:    Oral  SpO2: 98% 97% (!) 89% 100%  Weight:      Height:        General: Pt is alert, awake, not in acute distress Cardiovascular: RRR, S1/S2 +, no rubs, no gallops Respiratory: CTA bilaterally, no wheezing, no rhonchi Abdominal: Soft, NT, ND, bowel sounds + Extremities: no edema, no cyanosis    The results of significant diagnostics from this hospitalization (including imaging, microbiology, ancillary and laboratory) are listed below for reference.     Microbiology: Recent Results (from the past 240 hour(s))  Resp Panel by RT-PCR (Flu A&B, Covid) Nasopharyngeal Swab     Status: None   Collection Time: 02/09/21  1:07 PM   Specimen: Nasopharyngeal Swab; Nasopharyngeal(NP) swabs in vial transport medium  Result Value Ref Range Status   SARS Coronavirus 2 by RT PCR NEGATIVE NEGATIVE Final    Comment: (NOTE) SARS-CoV-2 target nucleic acids are NOT DETECTED.  The SARS-CoV-2 RNA is generally detectable in upper respiratory specimens during  the acute phase of infection. The lowest concentration of SARS-CoV-2 viral copies this assay can detect is 138 copies/mL. A negative result does not preclude SARS-Cov-2 infection and should not be used as the sole basis for treatment or other patient management decisions. A negative result may occur with  improper specimen collection/handling, submission of specimen other than nasopharyngeal swab, presence of viral mutation(s) within the areas targeted by this assay, and inadequate number of viral copies(<138 copies/mL). A negative result must be combined with clinical observations, patient history, and epidemiological information. The expected result is Negative.  Fact Sheet for Patients:  EntrepreneurPulse.com.au  Fact Sheet for Healthcare Providers:  IncredibleEmployment.be  This test is no t yet approved or cleared by the [place] FDA and  has been authorized for detection and/or diagnosis of SARS-CoV-2 by FDA under an Emergency Use Authorization (EUA). This EUA will remain  in effect (meaning this test can be used) for the duration of the COVID-19 declaration under Section 564(b)(1) of the Act, 21 U.S.C.section 360bbb-3(b)(1), unless the authorization is terminated  or revoked sooner.       Influenza A by PCR NEGATIVE NEGATIVE Final   Influenza B by PCR NEGATIVE NEGATIVE Final    Comment: (NOTE) The Xpert Xpress SARS-CoV-2/FLU/RSV plus assay is intended as an aid in the diagnosis of influenza from Nasopharyngeal swab specimens and should not be used as a sole basis for treatment. Nasal washings and aspirates are unacceptable for Xpert Xpress SARS-CoV-2/FLU/RSV testing.  Fact Sheet for Patients: EntrepreneurPulse.com.au  Fact Sheet for Healthcare Providers: IncredibleEmployment.be  This test is not yet approved or cleared by the [place] FDA and has been authorized for detection and/or  diagnosis of SARS-CoV-2 by FDA under an Emergency Use Authorization (EUA). This EUA will remain in effect (meaning this test can be used) for the duration of the COVID-19 declaration under Section 564(b)(1) of the Act, 21 U.S.C. section 360bbb-3(b)(1), unless the authorization is terminated or revoked.  Performed at [facility], [address] [place], [place] [address]      Labs: BNP (last 3 results) Recent Labs    02/09/21 1705  BNP 95.6   Basic Metabolic Panel: Recent Labs  Lab 02/13/21 0118  NA 140  K 3.4*  CL 110  CO2 24  GLUCOSE 100*  BUN <5*  CREATININE 0.75  CALCIUM 8.1*   Liver Function Tests: No results for input(s): AST, ALT, ALKPHOS,  BILITOT, PROT, ALBUMIN in the last 168 hours. No results for input(s): LIPASE, AMYLASE in the last 168 hours. No results for input(s): AMMONIA in the last 168 hours. CBC: Recent Labs  Lab 02/11/21 2221 02/12/21 0513 02/12/21 1249 02/13/21 0118 02/13/21 1317  HGB 9.6* 8.8* 8.6* 8.7* 9.5*  HCT 29.3* 26.3* 26.2* 26.0* 28.6*   Cardiac Enzymes: No results for input(s): CKTOTAL, CKMB, CKMBINDEX, TROPONINI in the last 168 hours. BNP: Invalid input(s): POCBNP CBG: Recent Labs  Lab 02/12/21 1135 02/12/21 1543 02/12/21 2149 02/13/21 0820 02/13/21 1211  GLUCAP 98 90 138* 107* 93   D-Dimer No results for input(s): DDIMER in the last 72 hours. Hgb A1c No results for input(s): HGBA1C in the last 72 hours. Lipid Profile No results for input(s): CHOL, HDL, LDLCALC, TRIG, CHOLHDL, LDLDIRECT in the last 72 hours. Thyroid function studies No results for input(s): TSH, T4TOTAL, T3FREE, THYROIDAB in the last 72 hours.  Invalid input(s): FREET3 Anemia work up No results for input(s): VITAMINB12, FOLATE, FERRITIN, TIBC, IRON, RETICCTPCT in the last 72 hours. Urinalysis    Component Value Date/Time   COLORURINE YELLOW (A) 09/06/2019 2041   APPEARANCEUR Clear 11/22/2020 0944   LABSPEC 1.020 09/06/2019 2041    PHURINE 6.0 09/06/2019 2041   GLUCOSEU Negative 11/22/2020 0944   HGBUR SMALL (A) 09/06/2019 2041   BILIRUBINUR Negative 11/22/2020 [place] 09/06/2019 2041   PROTEINUR Negative 11/22/2020 [place] NEGATIVE 09/06/2019 2041   NITRITE Negative 11/22/2020 0944   NITRITE NEGATIVE 09/06/2019 2041   LEUKOCYTESUR Negative 11/22/2020 0944   LEUKOCYTESUR NEGATIVE 09/06/2019 2041   Sepsis Labs Invalid input(s): PROCALCITONIN,  WBC,  LACTICIDVEN Microbiology Recent Results (from the past 240 hour(s))  Resp Panel by RT-PCR (Flu A&B, Covid) Nasopharyngeal Swab     Status: None   Collection Time: 02/09/21  1:07 PM   Specimen: Nasopharyngeal Swab; Nasopharyngeal(NP) swabs in vial transport medium  Result Value Ref Range Status   SARS Coronavirus 2 by RT PCR NEGATIVE NEGATIVE Final    Comment: (NOTE) SARS-CoV-2 target nucleic acids are NOT DETECTED.  The SARS-CoV-2 RNA is generally detectable in upper respiratory specimens during the acute phase of infection. The lowest concentration of SARS-CoV-2 viral copies this assay can detect is 138 copies/mL. A negative result does not preclude SARS-Cov-2 infection and should not be used as the sole basis for treatment or other patient management decisions. A negative result may occur with  improper specimen collection/handling, submission of specimen other than nasopharyngeal swab, presence of viral mutation(s) within the areas targeted by this assay, and inadequate number of viral copies(<138 copies/mL). A negative result must be combined with clinical observations, patient history, and epidemiological information. The expected result is Negative.  Fact Sheet for Patients:  EntrepreneurPulse.com.au  Fact Sheet for Healthcare Providers:  IncredibleEmployment.be  This test is no t yet approved or cleared by the [place] FDA and  has been authorized for detection and/or diagnosis of  SARS-CoV-2 by FDA under an Emergency Use Authorization (EUA). This EUA will remain  in effect (meaning this test can be used) for the duration of the COVID-19 declaration under Section 564(b)(1) of the Act, 21 U.S.C.section 360bbb-3(b)(1), unless the authorization is terminated  or revoked sooner.       Influenza A by PCR NEGATIVE NEGATIVE Final   Influenza B by PCR NEGATIVE NEGATIVE Final    Comment: (NOTE) The Xpert Xpress SARS-CoV-2/FLU/RSV plus assay is intended as an aid in the diagnosis of influenza from Nasopharyngeal  swab specimens and should not be used as a sole basis for treatment. Nasal washings and aspirates are unacceptable for Xpert Xpress SARS-CoV-2/FLU/RSV testing.  Fact Sheet for Patients: EntrepreneurPulse.com.au  Fact Sheet for Healthcare Providers: IncredibleEmployment.be  This test is not yet approved or cleared by the [place] FDA and has been authorized for detection and/or diagnosis of SARS-CoV-2 by FDA under an Emergency Use Authorization (EUA). This EUA will remain in effect (meaning this test can be used) for the duration of the COVID-19 declaration under Section 564(b)(1) of the Act, 21 U.S.C. section 360bbb-3(b)(1), unless the authorization is terminated or revoked.  Performed at [facility], [address] [place], [place] [address]      Time coordinating discharge: Over 30 minutes  SIGNED:   Ezekiel Slocumb, DO [facility] 02/17/2021, 4:38 PM   If 7PM-7AM, please contact night-coverage [email or web address]

## 2021-02-13 NOTE — Transfer of Care (Signed)
Immediate Anesthesia Transfer of Care Note  Patient: Mark Wyatt  Procedure(s) Performed: COLONOSCOPY WITH PROPOFOL (N/A )  Patient Location: PACU  Anesthesia Type:General  Level of Consciousness: awake and sedated  Airway & Oxygen Therapy: Patient Spontanous Breathing and Patient connected to face mask oxygen  Post-op Assessment: Report given to RN and Post -op Vital signs reviewed and stable  Post vital signs: Reviewed and stable  Last Vitals:  Vitals Value Taken Time  BP    Temp    Pulse    Resp    SpO2      Last Pain:  Vitals:   02/13/21 1006  TempSrc: Temporal  PainSc: 0-No pain         Complications: No complications documented.

## 2021-02-13 NOTE — Anesthesia Procedure Notes (Signed)
Performed by: Vaughan Sine Pre-anesthesia Checklist: Patient identified, Emergency Drugs available, Suction available, Patient being monitored and Timeout performed Patient Re-evaluated:Patient Re-evaluated prior to induction Oxygen Delivery Method: Simple face mask Induction Type: IV induction Placement Confirmation: positive ETCO2 and CO2 detector

## 2021-02-13 NOTE — Telephone Encounter (Signed)
Patient was just discharged from [facility] (Mark Wyatt). Would like to schedule EDG, asap. Please call patient

## 2021-02-13 NOTE — Care Management Important Message (Signed)
Important Message  Patient Details  Name: Mark Wyatt MRN: [id number] Date of Birth: January 12, 1951   Medicare Important Message Given:  Yes     Juliann Pulse A Baeleigh Devincent 02/13/2021, 11:10 AM

## 2021-02-14 ENCOUNTER — Telehealth: Payer: Self-pay | Admitting: Gastroenterology

## 2021-02-14 ENCOUNTER — Encounter: Payer: Self-pay | Admitting: *Deleted

## 2021-02-14 ENCOUNTER — Other Ambulatory Visit: Payer: Self-pay

## 2021-02-14 ENCOUNTER — Encounter: Payer: Self-pay | Admitting: Gastroenterology

## 2021-02-14 DIAGNOSIS — K625 Hemorrhage of anus and rectum: Secondary | ICD-10-CM

## 2021-02-14 NOTE — Telephone Encounter (Signed)
Please call Mark Wyatt to explain the appointment for [name].  She had a few questions.

## 2021-02-14 NOTE — Telephone Encounter (Signed)
I specifically called patient's wife-Mrs. Marcha Dutton and left her a voicemail. I will call her again tomorrow morning to go over the capsule study instructions and if she wants it in writing, I told her that I could leave her the instructions at the front desk for pick-up.

## 2021-02-14 NOTE — Telephone Encounter (Signed)
Called patient back in the afternoon after Dr. Bonna Gains stated that patient would be needing a capsule study next week. However, I had to leave a voicemail to call me back. Then today he called back and left a voicemail to call him back. Then I called him back and had to leave him another voicemail to call me back to let him know that he did not need an EGD but a capsule study per Dr. Bonna Gains for next week but he did not answer again. I will put the order in just because I do not want the space to be taken away by another patient or provider.

## 2021-02-15 NOTE — Telephone Encounter (Signed)
Dr. Bonna Gains, patient has a follow up appointment with you on 02/21/2021. However, he will have his capsule study done on that day. Manus Gunning would like to know if he should come in on that day sinc he will be doing the capsule study or reschedule appointment to a later date so you could have results and go over recommendations. Please advise.

## 2021-02-15 NOTE — Telephone Encounter (Signed)
Called patient's wife-Fran and went over the capsule study process/instructions. Manus Gunning had no further questions.

## 2021-02-16 NOTE — Telephone Encounter (Signed)
We can call him with capsule results and discuss when the next appointment should be at that time. Doesn't need appointment next week unless having new symptoms

## 2021-02-20 ENCOUNTER — Encounter: Payer: Self-pay | Admitting: Gastroenterology

## 2021-02-21 ENCOUNTER — Encounter
Admission: RE | Disposition: A | Discharge status: Home / Self Care | Payer: Self-pay | Source: Home / Self Care | Attending: Gastroenterology

## 2021-02-21 ENCOUNTER — Ambulatory Visit: Payer: Medicare Other | Admitting: Gastroenterology

## 2021-02-21 ENCOUNTER — Ambulatory Visit
Admission: RE | Admit: 2021-02-21 | Discharge: 2021-02-21 | Disposition: A | Discharge status: Home / Self Care | Payer: Medicare Other | Attending: Gastroenterology | Admitting: Gastroenterology

## 2021-02-21 DIAGNOSIS — D649 Anemia, unspecified: Secondary | ICD-10-CM | POA: Insufficient documentation

## 2021-02-21 DIAGNOSIS — K921 Melena: Secondary | ICD-10-CM | POA: Insufficient documentation

## 2021-02-21 DIAGNOSIS — K625 Hemorrhage of anus and rectum: Secondary | ICD-10-CM | POA: Diagnosis present

## 2021-02-21 DIAGNOSIS — D509 Iron deficiency anemia, unspecified: Secondary | ICD-10-CM | POA: Diagnosis not present

## 2021-02-21 HISTORY — PX: GIVENS CAPSULE STUDY: SHX5432

## 2021-02-21 SURGERY — IMAGING PROCEDURE, GI TRACT, INTRALUMINAL, VIA CAPSULE

## 2021-02-22 ENCOUNTER — Encounter: Payer: Self-pay | Admitting: Gastroenterology

## 2021-02-27 NOTE — Anesthesia Postprocedure Evaluation (Signed)
Anesthesia Post Note  Patient: Mark Wyatt  Procedure(s) Performed: COLONOSCOPY WITH PROPOFOL (N/A )  Patient location during evaluation: PACU Anesthesia Type: General Level of consciousness: awake and alert Pain management: pain level controlled Vital Signs Assessment: post-procedure vital signs reviewed and stable Respiratory status: spontaneous breathing, nonlabored ventilation, respiratory function stable and patient connected to nasal cannula oxygen Cardiovascular status: blood pressure returned to baseline and stable Postop Assessment: no apparent nausea or vomiting Anesthetic complications: no   No complications documented.   Last Vitals:  Vitals:   02/13/21 1130 02/13/21 1206  BP: 112/65 (!) 157/67  Pulse: (!) 54 (!) 58  Resp: 15 17  Temp:  36.6 C  SpO2: (!) 89% 100%    Last Pain:  Vitals:   02/13/21 1400  TempSrc:   PainSc: 0-No pain                 Molli Barrows

## 2021-03-05 ENCOUNTER — Encounter: Payer: Self-pay | Admitting: Gastroenterology

## 2021-03-05 ENCOUNTER — Ambulatory Visit (INDEPENDENT_AMBULATORY_CARE_PROVIDER_SITE_OTHER): Payer: Medicare Other | Admitting: Gastroenterology

## 2021-03-05 VITALS — BP 144/77 | HR 77 | Temp 97.5°F | Ht 71.0 in | Wt 193.2 lb

## 2021-03-05 DIAGNOSIS — D500 Iron deficiency anemia secondary to blood loss (chronic): Secondary | ICD-10-CM | POA: Diagnosis not present

## 2021-03-05 DIAGNOSIS — K2980 Duodenitis without bleeding: Secondary | ICD-10-CM

## 2021-03-05 MED ORDER — PANTOPRAZOLE SODIUM 40 MG PO TBEC: 40.0000 mg | DELAYED_RELEASE_TABLET | Freq: Two times a day (BID) | ORAL | 0 refills | Status: DC

## 2021-03-05 NOTE — Progress Notes (Signed)
Mark Antigua, MD [address]  [place]  [place], [place] [address]  Main: [phone]  Fax: [phone]   Primary Care Physician: Lavera Guise, MD   Chief complaint: Hospital follow-up  HPI: Koron Godeaux is a 70 y.o. male here for follow-up posthospitalization.  Patient and wife state his abdominal symptoms including the abdominal pain that he initially presented to our clinic with has completely resolved since his hospital stay.  He has not had any further episodes of bleeding since hospital discharge.  No melena, no hematochezia.  No nausea or vomiting.  States his energy level is also improving.   Current Outpatient Medications  Medication Sig Dispense Refill  . amLODipine (NORVASC) 2.5 MG tablet Take 1 tablet (2.5 mg total) by mouth daily. 90 tablet 3  . atorvastatin (LIPITOR) 40 MG tablet Take 1 tablet (40 mg total) by mouth daily at 6 PM. 90 tablet 1  . Blood Glucose Monitoring Suppl (FREESTYLE LITE) w/Device KIT 1 each by Does not apply route daily. Use as directed 1 kit 0  . glucose blood (FREESTYLE LITE) test strip Use as instructed as needed once a day DX impaired fasting glucose 100 each 1  . Lancets 30G MISC Use as directed once a day DX impaired fasting glucose 100 each 1  . metFORMIN (GLUCOPHAGE-XR) 500 MG 24 hr tablet Take 1 tablet (500 mg total) by mouth daily with breakfast. 90 tablet 1  . nitroGLYCERIN (NITROSTAT) 0.4 MG SL tablet Place 1 tablet (0.4 mg total) under the tongue every 5 (five) minutes as needed for chest pain. 30 tablet 0  . pantoprazole (PROTONIX) 40 MG tablet Take 1 tablet (40 mg total) by mouth 2 (two) times daily. 60 tablet 0  . tadalafil (CIALIS) 20 MG tablet 1 tab 1 hour prior to intercourse 30 tablet 0  . trospium (SANCTURA) 20 MG tablet TAKE 1 TABLET(20 MG) BY MOUTH TWICE DAILY 90 tablet 1   No current facility-administered medications for this visit.    Allergies as of 03/05/2021 - Review Complete 03/05/2021  Allergen  Reaction Noted  . Penicillins Other (See Comments) 01/16/2011  . Aspirin Other (See Comments) 02/05/2012    ROS:  General: Negative for anorexia, weight loss, fever, chills, fatigue, weakness. ENT: Negative for hoarseness, difficulty swallowing , nasal congestion. CV: Negative for chest pain, angina, palpitations, dyspnea on exertion, peripheral edema.  Respiratory: Negative for dyspnea at rest, dyspnea on exertion, cough, sputum, wheezing.  GI: See history of present illness. GU:  Negative for dysuria, hematuria, urinary incontinence, urinary frequency, nocturnal urination.  Endo: Negative for unusual weight change.    Physical Examination:   BP (!) 144/77 (BP Location: Left Arm, Patient Position: Sitting, Cuff Size: Normal)   Pulse 77   Temp (!) 97.5 F (36.4 C) (Oral)   Ht _0  (1.803 m)   Wt 193 lb 4 oz (87.7 kg)   BMI 26.95 kg/m   General: Well-nourished, well-developed in no acute distress.  Eyes: No icterus. Conjunctivae pink. Mouth: Oropharyngeal mucosa moist and pink , no lesions erythema or exudate. Neck: Supple, Trachea midline Abdomen: Bowel sounds are normal, nontender, nondistended, no hepatosplenomegaly or masses, no abdominal bruits or hernia , no rebound or guarding.   Extremities: No lower extremity edema. No clubbing or deformities. Neuro: Alert and oriented x 3.  Grossly intact. Skin: Warm and dry, no jaundice.   Psych: Alert and cooperative, normal mood and affect.   Labs: CMP     Component Value Date/Time  NA 140 02/13/2021 0118   NA 134 01/09/2021 1008   K 3.4 (L) 02/13/2021 0118   CL 110 02/13/2021 0118   CO2 24 02/13/2021 0118   GLUCOSE 100 (H) 02/13/2021 0118   BUN <5 (L) 02/13/2021 0118   BUN 12 01/09/2021 1008   CREATININE 0.75 02/13/2021 0118   CALCIUM 8.1 (L) 02/13/2021 0118   PROT 6.1 (L) 02/09/2021 1222   PROT 7.1 01/09/2021 1008   ALBUMIN 3.3 (L) 02/09/2021 1222   ALBUMIN 4.1 01/09/2021 1008   AST 18 02/09/2021 1222   ALT 16  02/09/2021 1222   ALKPHOS 61 02/09/2021 1222   BILITOT 0.5 02/09/2021 1222   BILITOT 1.6 (H) 01/09/2021 1008   GFRNONAA >60 02/13/2021 0118   GFRAA 88 08/01/2020 1113   Lab Results  Component Value Date   WBC 6.2 02/10/2021   HGB 9.5 (L) 02/13/2021   HCT 28.6 (L) 02/13/2021   MCV 86.9 02/10/2021   PLT 151 02/10/2021    Imaging Studies: CT Angio Abd/Pel W and/or Wo Contrast  Result Date: 02/09/2021 CLINICAL DATA:  69 year old with rectal bleeding. EXAM: CTA ABDOMEN AND PELVIS WITHOUT AND WITH CONTRAST TECHNIQUE: Multidetector CT imaging of the abdomen and pelvis was performed using the standard protocol during bolus administration of intravenous contrast. Multiplanar reconstructed images and MIPs were obtained and reviewed to evaluate the vascular anatomy. CONTRAST:  156m OMNIPAQUE IOHEXOL 350 MG/ML SOLN COMPARISON:  08/27/2019 FINDINGS: VASCULAR Aorta: Atherosclerotic disease in the abdominal aorta without aneurysm, dissection or significant stenosis. Celiac: Minimal narrowing at the origin of the celiac trunk. Main branch vessels are patent. Negative for aneurysm or dissection. SMA: Patent without evidence of aneurysm, dissection, vasculitis or significant stenosis. Renals: Single right renal artery is patent with atherosclerotic disease but no significant stenosis. Main left renal artery is patent without significant stenosis. There are 3 small accessory left renal arteries. IMA: Patent without evidence of aneurysm, dissection, vasculitis or significant stenosis. Inflow: Atherosclerotic disease without aneurysm, dissection or significant stenosis. Common, internal external iliac arteries are patent bilaterally. Proximal Outflow: Proximal femoral arteries are patent bilaterally. Veins: Hepatic veins are patent. Portal venous system is patent. Normal appearance of the IVC and renal veins. Review of the MIP images confirms the above findings. NON-VASCULAR Lower chest: Lung bases are clear without  pleural effusions. Coronary artery calcifications. Hepatobiliary: Normal appearance of the liver and gallbladder. No suspicious hepatic lesions. No biliary dilatation. Pancreas: Unremarkable. No pancreatic ductal dilatation or surrounding inflammatory changes. Spleen: Normal in size without focal abnormality. Adrenals/Urinary Tract: Normal appearance of the adrenal glands. There is a large exophytic cyst adjacent to the right adrenal gland. Bilateral renal cysts. No hydronephrosis. No suspicious renal lesions. Normal appearance of the urinary bladder. No kidney stones. Stomach/Bowel: Stomach is within normal limits. No evidence of bowel wall thickening, distention, or inflammatory changes. No evidence for intraluminal contrast or active GI bleeding. Lymphatic: No lymph node enlargement in the abdomen or pelvis. Reproductive: Enlarged prostate. Other: Negative for ascites. Musculoskeletal: No acute bone abnormality. IMPRESSION: VASCULAR 1. No active GI bleeding. 2. Aortic Atherosclerosis (ICD10-I70.0). Coronary artery calcifications. NON-VASCULAR 1. No acute abnormality in the abdomen or pelvis. 2. Bilateral renal cysts. 3. Prostate enlargement. Electronically Signed   By: AMarkus DaftM.D.   On: 02/09/2021 14:47    Assessment and Plan:   CJustn Qualeis a 70y.o. y/o male here for follow-up of abdominal pain, and recent hospitalization for GI bleed  Abdominal pain has completely resolved and most likely was due  to duodenitis that developed after patient was taking a combination of prednisone, colchicine and was already on aspirin chronically, which led to the possible duodenitis that was seen on his upper GI study  No evidence of active bleeding was seen during his upper and lower endoscopies  Patient may have other underlying AVMs that could have caused his bleeding that led to the hospital stay, versus a Dieulafoy lesion  The findings of his procedures were discussed in detail with patient and his  wife  They had several questions which were answered in detail to their satisfaction  They state his cardiologist has recommended that he restart his aspirin, and given that patient has not had any signs of further active bleeding and since his cardiologist is recommending resuming the aspirin, I would agree with this recommendation given that benefits outweigh risks at this time  However, if patient has any recurrent symptoms such as bleeding, abdominal pain he was advised to immediately call us or go to the ER and he verbalized understanding  Given that duodenitis was seen on an upper GI study which was done a month before his initial upper endoscopy, the duodenitis may have resolved with the PPI he was placed on and could have been the source of his abdominal pain symptoms that have now resolved  Okay to continue PPI twice daily for total of 8 weeks  They do state that he gets intermittent treatment for gout and is chronically on aspirin and his gout treatment includes intermittent steroids.  If he needs to be on a combination of steroid and his aspirin, we may need to give him PPI once daily prophylactically to prevent future episodes of duodenitis given that he had significant abdominal pain from it before.  He is agreeable to hematology referral for iron deficiency anemia given low iron saturation seen in the hospital  I will repeat his hemoglobin at this time as well to ensure it is improving as expected  (Risks of PPI use were discussed with patient including bone loss, C. Diff diarrhea, pneumonia, infections, CKD, electrolyte abnormalities.  Pt. Verbalizes understanding and chooses to continue the medication.)   More than 60% of 45-minute visit spent in discussing findings, recommendations, and answering patient's and family's questions  Dr Mark Wyatt

## 2021-03-07 ENCOUNTER — Other Ambulatory Visit: Payer: Self-pay

## 2021-03-07 ENCOUNTER — Ambulatory Visit (INDEPENDENT_AMBULATORY_CARE_PROVIDER_SITE_OTHER): Payer: Medicare Other | Admitting: Dermatology

## 2021-03-07 DIAGNOSIS — L905 Scar conditions and fibrosis of skin: Secondary | ICD-10-CM | POA: Diagnosis not present

## 2021-03-07 DIAGNOSIS — L821 Other seborrheic keratosis: Secondary | ICD-10-CM

## 2021-03-07 DIAGNOSIS — D1800 Hemangioma unspecified site: Secondary | ICD-10-CM

## 2021-03-07 DIAGNOSIS — L814 Other melanin hyperpigmentation: Secondary | ICD-10-CM

## 2021-03-07 DIAGNOSIS — L820 Inflamed seborrheic keratosis: Secondary | ICD-10-CM | POA: Diagnosis not present

## 2021-03-07 DIAGNOSIS — D229 Melanocytic nevi, unspecified: Secondary | ICD-10-CM

## 2021-03-07 DIAGNOSIS — L578 Other skin changes due to chronic exposure to nonionizing radiation: Secondary | ICD-10-CM

## 2021-03-07 DIAGNOSIS — Z1283 Encounter for screening for malignant neoplasm of skin: Secondary | ICD-10-CM

## 2021-03-07 NOTE — Patient Instructions (Signed)

## 2021-03-07 NOTE — Progress Notes (Signed)
   Follow-Up Visit   Subjective  Mark Wyatt is a 70 y.o. male who presents for the following: Follow-up (ISK follow up of arms and chest - everything cleared up except one spot on left arm). The patient presents for Upper Body Skin Exam (UBSE) for skin cancer screening and mole check.  The following portions of the chart were reviewed this encounter and updated as appropriate:   Tobacco  Allergies  Meds  Problems  Med Hx  Surg Hx  Fam Hx     Review of Systems:  No other skin or systemic complaints except as noted in HPI or Assessment and Plan.  Objective  Well appearing patient in no apparent distress; mood and affect are within normal limits.  All skin waist up examined.  Objective  Left forearm x 1, back x 3 (3): Erythematous keratotic or waxy stuck-on papule or plaque.   Objective  Left distal nose: Scar   Assessment & Plan    Lentigines - Scattered tan macules - Due to sun exposure - Benign-appering, observe - Recommend daily broad spectrum sunscreen SPF 30+ to sun-exposed areas, reapply every 2 hours as needed. - Call for any changes  Seborrheic Keratoses - Stuck-on, waxy, tan-brown papules and/or plaques  - Benign-appearing - Discussed benign etiology and prognosis. - Observe - Call for any changes  Melanocytic Nevi - Tan-brown and/or pink-flesh-colored symmetric macules and papules - Benign appearing on exam today - Observation - Call clinic for new or changing moles - Recommend daily use of broad spectrum spf 30+ sunscreen to sun-exposed areas.   Hemangiomas - Red papules - Discussed benign nature - Observe - Call for any changes  Actinic Damage - Chronic condition, secondary to cumulative UV/sun exposure - diffuse scaly erythematous macules with underlying dyspigmentation - Recommend daily broad spectrum sunscreen SPF 30+ to sun-exposed areas, reapply every 2 hours as needed.  - Staying in the shade or wearing long sleeves, sun glasses  (UVA+UVB protection) and wide brim hats (4-inch brim around the entire circumference of the hat) are also recommended for sun protection.  - Call for new or changing lesions.  Skin cancer screening performed today.  Inflamed seborrheic keratosis (3) Left forearm x 1, back x 3  Destruction of lesion - Left forearm x 1, back x 3 Complexity: simple   Destruction method: cryotherapy   Informed consent: discussed and consent obtained   Timeout:  patient name, date of birth, surgical site, and procedure verified Lesion destroyed using liquid nitrogen: Yes   Region frozen until ice ball extended beyond lesion: Yes   Outcome: patient tolerated procedure well with no complications   Post-procedure details: wound care instructions given    Scar Left distal nose  Benign, observe.    Return in about 1 year (around 03/07/2022).  I, Ashok Cordia, CMA, am acting as scribe for Sarina Ser, MD .  Documentation: I have reviewed the above documentation for accuracy and completeness, and I agree with the above.  Sarina Ser, MD

## 2021-03-08 LAB — HEMOGLOBIN: Hemoglobin: 12.6 g/dL — ABNORMAL LOW (ref 13.0–17.7)

## 2021-03-13 ENCOUNTER — Other Ambulatory Visit: Payer: Self-pay

## 2021-03-13 DIAGNOSIS — K2980 Duodenitis without bleeding: Secondary | ICD-10-CM

## 2021-03-13 MED ORDER — PANTOPRAZOLE SODIUM 40 MG PO TBEC: 40.0000 mg | DELAYED_RELEASE_TABLET | Freq: Two times a day (BID) | ORAL | 0 refills | Status: DC

## 2021-03-15 DIAGNOSIS — D509 Iron deficiency anemia, unspecified: Secondary | ICD-10-CM | POA: Insufficient documentation

## 2021-03-15 NOTE — Progress Notes (Signed)
[facility]  [phone] [phone]  ID: Mark Wyatt OB: January 04, 1951  MR#: [id number]  IWP#:[id number]  Patient Care Team: Lavera Guise, MD as PCP - General (Internal Medicine)  CHIEF COMPLAINT: Iron deficiency anemia secondary to GI bleed.  INTERVAL HISTORY: Patient is a 70 year old male who was admitted to the hospital approximately 4 to 5 weeks ago with melena and severe anemia.  Subsequent EGD, colonoscopy, and video endoscopy only revealed nonbleeding AVMs and underlying gastritis.  He currently feels well and is nearly back to his baseline.  He does not complain of any weakness or fatigue.  He has no neurologic complaints.  He denies any recent fevers or illnesses.  He has a good appetite and reports a history weight loss, but now is gaining weight.  He has no chest pain, shortness of breath, cough, or hemoptysis.  He denies any nausea, vomiting, constipation, or diarrhea.  He has no further melena or hematochezia.  He has no urinary complaints.  Patient offers no further specific complaints today.  REVIEW OF SYSTEMS:   Review of Systems  Constitutional: Negative.  Negative for fever, malaise/fatigue and weight loss.  Respiratory: Negative.  Negative for cough, hemoptysis and shortness of breath.   Cardiovascular: Negative.  Negative for chest pain and leg swelling.  Gastrointestinal: Negative.  Negative for abdominal pain, blood in stool and melena.  Genitourinary: Negative.  Negative for dysuria.  Musculoskeletal: Negative.  Negative for back pain.  Skin: Negative.  Negative for rash.  Neurological: Negative.  Negative for dizziness, focal weakness, weakness and headaches.  Psychiatric/Behavioral: Negative.  The patient is not nervous/anxious.     As per HPI. Otherwise, a complete review of systems is negative.  PAST MEDICAL HISTORY: Past Medical History:  Diagnosis Date  . Diabetes mellitus without complication ([facility])   . Gout   .  Hyperlipidemia   . Hypertension   . Myocardial infarction (HCC)    mild  . Overactive bladder    [facility]  . Trigger finger of all digits of right hand     PAST SURGICAL HISTORY: Past Surgical History:  Procedure Laterality Date  . ANAL FISTULOTOMY    . CARDIAC CATHETERIZATION    . COLONOSCOPY WITH PROPOFOL N/A 02/13/2021   Procedure: COLONOSCOPY WITH PROPOFOL;  Surgeon: [name], MD;  Location: [facility] ENDOSCOPY;  Service: Gastroenterology;  Laterality: N/A;  . CORONARY STENT INTERVENTION N/A 02/24/2017   Procedure: Coronary Stent Intervention;  Surgeon: [name], MD;  Location: [facility] CV LAB;  Service: Cardiovascular;  Laterality: N/A;  . ESOPHAGOGASTRODUODENOSCOPY N/A 02/10/2021   Procedure: ESOPHAGOGASTRODUODENOSCOPY (EGD);  Surgeon: [name], MD;  Location: [facility] ENDOSCOPY;  Service: Gastroenterology;  Laterality: N/A;  . ESOPHAGOGASTRODUODENOSCOPY (EGD) WITH PROPOFOL N/A 01/25/2021   Procedure: ESOPHAGOGASTRODUODENOSCOPY (EGD) WITH PROPOFOL;  Surgeon: [name], MD;  Location: [facility] ENDOSCOPY;  Service: Endoscopy;  Laterality: N/A;  . GIVENS CAPSULE STUDY N/A 02/21/2021   Procedure: GIVENS CAPSULE STUDY;  Surgeon: [name], MD;  Location: [facility] ENDOSCOPY;  Service: Endoscopy;  Laterality: N/A;  . LEFT HEART CATH AND CORONARY ANGIOGRAPHY N/A 02/24/2017   Procedure: Left Heart Cath and Coronary Angiography;  Surgeon: [name], MD;  Location: [facility] LAB;  Service: Cardiovascular;  Laterality: N/A;    FAMILY HISTORY: Family History  Problem Relation Age of Onset  . Diabetes Mother     ADVANCED DIRECTIVES (Y/N):  N  HEALTH MAINTENANCE: Social History   Tobacco Use  . Smoking status:  Former Smoker  . Smokeless tobacco: Never Used  Vaping Use  . Vaping Use: Never used  Substance Use Topics  . Alcohol use: Yes    Alcohol/week: 6.0 standard drinks    Types: 6 Cans of beer per week    Comment: 1 or 2 with  dinner  . Drug use: No     Colonoscopy:  PAP:  Bone density:  Lipid panel:  Allergies  Allergen Reactions  . Penicillins Other (See Comments)    Headache  Headache  . Aspirin Other (See Comments)    02/05/2020-pt has been taking baby aspirin before bed and tolerating well.    Current Outpatient Medications  Medication Sig Dispense Refill  . amLODipine (NORVASC) 2.5 MG tablet Take 1 tablet (2.5 mg total) by mouth daily. 90 tablet 3  . atorvastatin (LIPITOR) 40 MG tablet Take 1 tablet (40 mg total) by mouth daily at 6 PM. 90 tablet 1  . Blood Glucose Monitoring Suppl (FREESTYLE LITE) w/Device KIT 1 each by Does not apply route daily. Use as directed 1 kit 0  . glucose blood (FREESTYLE LITE) test strip Use as instructed as needed once a day DX: E11.65 100 each 1  . Lancets 30G MISC Use as directed once a day DX impaired fasting glucose 100 each 1  . metFORMIN (GLUCOPHAGE-XR) 500 MG 24 hr tablet Take 1 tablet (500 mg total) by mouth daily with breakfast. 90 tablet 1  . nitroGLYCERIN (NITROSTAT) 0.4 MG SL tablet Place 1 tablet (0.4 mg total) under the tongue every 5 (five) minutes as needed for chest pain. 30 tablet 0  . pantoprazole (PROTONIX) 40 MG tablet Take 1 tablet (40 mg total) by mouth 2 (two) times daily. 180 tablet 0  . tadalafil (CIALIS) 20 MG tablet 1 tab 1 hour prior to intercourse 30 tablet 0  . trospium (SANCTURA) 20 MG tablet TAKE 1 TABLET(20 MG) BY MOUTH TWICE DAILY 90 tablet 1   No current facility-administered medications for this visit.    OBJECTIVE: Vitals:   03/21/21 1147  BP: (!) 152/79  Pulse: (!) 57  Temp: (!) 97.4 F (36.3 C)  SpO2: 99%     Body mass index is 27.38 kg/m.    ECOG FS:0 - Asymptomatic  General: Well-developed, well-nourished, no acute distress. Eyes: Pink conjunctiva, anicteric sclera. HEENT: Normocephalic, moist mucous membranes. Lungs: No audible wheezing or coughing. Heart: Regular rate and rhythm. Abdomen: Soft, nontender, no  obvious distention. Musculoskeletal: No edema, cyanosis, or clubbing. Neuro: Alert, answering all questions appropriately. Cranial nerves grossly intact. Skin: No rashes or petechiae noted. Psych: Normal affect. Lymphatics: No cervical, calvicular, axillary or inguinal LAD.   LAB RESULTS:  Lab Results  Component Value Date   NA 140 02/13/2021   K 3.4 (L) 02/13/2021   CL 110 02/13/2021   CO2 24 02/13/2021   GLUCOSE 100 (H) 02/13/2021   BUN <5 (L) 02/13/2021   CREATININE 0.75 02/13/2021   CALCIUM 8.1 (L) 02/13/2021   PROT 6.1 (L) 02/09/2021   ALBUMIN 3.3 (L) 02/09/2021   AST 18 02/09/2021   ALT 16 02/09/2021   ALKPHOS 61 02/09/2021   BILITOT 0.5 02/09/2021   GFRNONAA >60 02/13/2021   GFRAA 88 08/01/2020    Lab Results  Component Value Date   WBC 5.7 03/21/2021   NEUTROABS 2.9 03/21/2021   HGB 13.4 03/21/2021   HCT 41.4 03/21/2021   MCV 85.2 03/21/2021   PLT 296 03/21/2021   Lab Results  Component Value Date   IRON  64 03/21/2021   TIBC 402 03/21/2021   IRONPCTSAT 16 (L) 03/21/2021   Lab Results  Component Value Date   FERRITIN 30 03/21/2021     STUDIES: No results found.   ASSESSMENT: Iron deficiency anemia secondary to GI bleed.   PLAN:    1. Iron deficiency anemia: Resolved.  Patient underwent colonoscopy, EGD, and video endoscopy recently which only revealed nonbleeding AVMs.  Bleed possibly secondary to underlying gastritis as a result of taking colchicine, prednisone, and aspirin for a gout flare.  Patient's hemoglobin and iron stores are now within normal limits although he does have a mildly decreased iron saturation. Recommended patient initiate oral iron supplementation.  No further interventions are needed.  He does not require IV iron today.  Return to clinic in 6 months with repeat laboratory work and further evaluation.  If his laboratory works remained stable, he likely can be discharged from clinic. 2.  GI bleed: Follow-up with gastroenterology  as scheduled.  I spent a total of 45 minutes reviewing chart data, face-to-face evaluation with the patient, counseling and coordination of care as detailed above.   Patient expressed understanding and was in agreement with this plan. He also understands that He can call clinic at any time with any questions, concerns, or complaints.    Lloyd Huger, MD   03/21/2021 4:09 PM

## 2021-03-16 ENCOUNTER — Encounter: Payer: Self-pay | Admitting: Dermatology

## 2021-03-16 ENCOUNTER — Other Ambulatory Visit: Payer: Self-pay

## 2021-03-16 DIAGNOSIS — R7303 Prediabetes: Secondary | ICD-10-CM

## 2021-03-16 MED ORDER — FREESTYLE LITE TEST VI STRP: ORAL_STRIP | 1 refills | Status: DC

## 2021-03-21 ENCOUNTER — Inpatient Hospital Stay: Payer: Medicare Other | Attending: Oncology | Admitting: Oncology

## 2021-03-21 ENCOUNTER — Encounter: Payer: Self-pay | Admitting: Oncology

## 2021-03-21 ENCOUNTER — Other Ambulatory Visit: Payer: Self-pay

## 2021-03-21 ENCOUNTER — Inpatient Hospital Stay: Payer: Medicare Other

## 2021-03-21 DIAGNOSIS — Z6827 Body mass index (BMI) 27.0-27.9, adult: Secondary | ICD-10-CM | POA: Diagnosis not present

## 2021-03-21 DIAGNOSIS — Z79899 Other long term (current) drug therapy: Secondary | ICD-10-CM | POA: Insufficient documentation

## 2021-03-21 DIAGNOSIS — D509 Iron deficiency anemia, unspecified: Secondary | ICD-10-CM

## 2021-03-21 DIAGNOSIS — E119 Type 2 diabetes mellitus without complications: Secondary | ICD-10-CM | POA: Diagnosis not present

## 2021-03-21 DIAGNOSIS — Z7984 Long term (current) use of oral hypoglycemic drugs: Secondary | ICD-10-CM | POA: Insufficient documentation

## 2021-03-21 DIAGNOSIS — I252 Old myocardial infarction: Secondary | ICD-10-CM | POA: Diagnosis not present

## 2021-03-21 DIAGNOSIS — D500 Iron deficiency anemia secondary to blood loss (chronic): Secondary | ICD-10-CM | POA: Diagnosis not present

## 2021-03-21 DIAGNOSIS — K921 Melena: Secondary | ICD-10-CM | POA: Insufficient documentation

## 2021-03-21 DIAGNOSIS — Z87891 Personal history of nicotine dependence: Secondary | ICD-10-CM | POA: Insufficient documentation

## 2021-03-21 DIAGNOSIS — N3281 Overactive bladder: Secondary | ICD-10-CM | POA: Diagnosis not present

## 2021-03-21 DIAGNOSIS — E785 Hyperlipidemia, unspecified: Secondary | ICD-10-CM | POA: Insufficient documentation

## 2021-03-21 DIAGNOSIS — I10 Essential (primary) hypertension: Secondary | ICD-10-CM | POA: Diagnosis not present

## 2021-03-21 LAB — CBC WITH DIFFERENTIAL/PLATELET
Abs Immature Granulocytes: 0.02 10*3/uL (ref 0.00–0.07)
Basophils Absolute: 0.1 10*3/uL (ref 0.0–0.1)
Basophils Relative: 1 %
Eosinophils Absolute: 0.2 10*3/uL (ref 0.0–0.5)
Eosinophils Relative: 3 %
HCT: 41.4 % (ref 39.0–52.0)
Hemoglobin: 13.4 g/dL (ref 13.0–17.0)
Immature Granulocytes: 0 %
Lymphocytes Relative: 33 %
Lymphs Abs: 1.9 10*3/uL (ref 0.7–4.0)
MCH: 27.6 pg (ref 26.0–34.0)
MCHC: 32.4 g/dL (ref 30.0–36.0)
MCV: 85.2 fL (ref 80.0–100.0)
Monocytes Absolute: 0.7 10*3/uL (ref 0.1–1.0)
Monocytes Relative: 13 %
Neutro Abs: 2.9 10*3/uL (ref 1.7–7.7)
Neutrophils Relative %: 50 %
Platelets: 296 10*3/uL (ref 150–400)
RBC: 4.86 MIL/uL (ref 4.22–5.81)
RDW: 14.2 % (ref 11.5–15.5)
WBC: 5.7 10*3/uL (ref 4.0–10.5)
nRBC: 0 % (ref 0.0–0.2)

## 2021-03-21 LAB — IRON AND TIBC
Iron: 64 ug/dL (ref 45–182)
Saturation Ratios: 16 % — ABNORMAL LOW (ref 17.9–39.5)
TIBC: 402 ug/dL (ref 250–450)
UIBC: 338 ug/dL

## 2021-03-21 LAB — FERRITIN: Ferritin: 30 ng/mL (ref 24–336)

## 2021-03-22 ENCOUNTER — Other Ambulatory Visit: Payer: Self-pay

## 2021-03-28 ENCOUNTER — Telehealth: Payer: Self-pay | Admitting: *Deleted

## 2021-03-28 NOTE — Telephone Encounter (Signed)
Manus Gunning called stating that they were to have gotten a call with patient lab results shince they do not have My Chart, but she has not heard from [place]. She would like a return call with results and what needs to be done  Iron and TIBC Order: [id number]  Status: Final result   Visible to patient: No (inaccessible in [place])   Next appt: 04/05/2021 at 08:40 AM in Pulmonology Jonetta Osgood, NP)   Dx: Iron deficiency anemia, unspecified i...   0 Result Notes  Component Ref Range & Units 7 d ago 1 mo ago  Iron 45 - 182 ug/dL 64  26Low   TIBC 250 - 450 ug/dL 402  298   Saturation Ratios 17.9 - 39.5 % 16Low  9Low   UIBC ug/dL 338  272 CM   Comment: Performed at [facility], [address] [place], [place] [address]  Resulting Agency  [facility] CLIN LAB [facility] CLIN LAB         Specimen Collected: 03/21/21 12:18 Last Resulted: 03/21/21 13:27     Lab Flowsheet   Order Details   View Encounter   Lab and Collection Details   Routing   Result History     CM=Additional comments     Result Care Coordination   Patient Communication  Add Comments Not seen Back to Top        Other Results from 03/21/2021   Ferritin Order: [id number]  Status: Final result   Visible to patient: No (inaccessible in MyChart)   Next appt: 04/05/2021 at 08:40 AM in Pulmonology Jonetta Osgood, NP)   Dx: Iron deficiency anemia, unspecified i...   0 Result Notes  Component Ref Range & Units 7 d ago 1 mo ago  Ferritin 24 - 336 ng/mL 30  156 CM   Comment: Performed at [facility], [address] [place], [place] [address]  Resulting Agency  [facility] CLIN LAB [facility] CLIN LAB         Specimen Collected: 03/21/21 12:18 Last Resulted: 03/21/21 13:27     Lab Flowsheet   Order Details   View Encounter   Lab and Collection Details   Routing   Result History     CM=Additional comments     Result Care Coordination   Patient Communication  Add Comments Not  seen Back to Top         CBC with Differential/Platelet Order: [id number]  Status: Final result    Visible to patient: No (inaccessible in MyChart)    Next appt: 04/05/2021 at 08:40 AM in Pulmonology Jonetta Osgood, NP)    Dx: Iron deficiency anemia, unspecified i...    0 Result Notes   Component Ref Range & Units 7 d ago  (03/21/21) 3 wk ago  (03/07/21) 1 mo ago  (02/13/21) 1 mo ago  (02/13/21) 1 mo ago  (02/12/21) 1 mo ago  (02/12/21) 1 mo ago  (02/11/21)  WBC 4.0 - 10.5 K/uL 5.7         RBC 4.22 - 5.81 MIL/uL 4.86         Hemoglobin 13.0 - 17.0 g/dL 13.4  12.6Low R  9.5Low  8.7Low  8.6Low  8.8Low  9.6Low CM   HCT 39.0 - 52.0 % 41.4   28.6Low CM  26.0Low CM  26.2Low CM  26.3Low CM  29.3Low CM   MCV 80.0 - 100.0 fL 85.2         MCH 26.0 - 34.0 pg 27.6  MCHC 30.0 - 36.0 g/dL 32.4         RDW 11.5 - 15.5 % 14.2         Platelets 150 - 400 K/uL 296         nRBC 0.0 - 0.2 % 0.0         Neutrophils Relative % % 50         Neutro Abs 1.7 - 7.7 K/uL 2.9         Lymphocytes Relative % 33         Lymphs Abs 0.7 - 4.0 K/uL 1.9         Monocytes Relative % 13         Monocytes Absolute 0.1 - 1.0 K/uL 0.7         Eosinophils Relative % 3         Eosinophils Absolute 0.0 - 0.5 K/uL 0.2         Basophils Relative % 1         Basophils Absolute 0.0 - 0.1 K/uL 0.1         Immature Granulocytes % 0         Abs Immature Granulocytes 0.00 - 0.07 K/uL 0.02         Comment: Performed at [facility], [address] [place], [place] [address]  Resulting Agency  [facility] CLIN [place] CLIN LAB [place] CLIN LAB [place] CLIN LAB [place] CLIN LAB [place] CLIN LAB          Specimen Collected: 03/21/21 12:18 Last Resulted: 03/21/21 12:40

## 2021-03-28 NOTE — Telephone Encounter (Signed)
Call returned and left nondescript message that labs are good, No IV iron needed and continue oral iron and keep appointment as scheduled

## 2021-04-05 ENCOUNTER — Ambulatory Visit (INDEPENDENT_AMBULATORY_CARE_PROVIDER_SITE_OTHER): Payer: Medicare Other | Admitting: Nurse Practitioner

## 2021-04-05 ENCOUNTER — Other Ambulatory Visit: Payer: Self-pay

## 2021-04-05 ENCOUNTER — Encounter: Payer: Self-pay | Admitting: Nurse Practitioner

## 2021-04-05 VITALS — BP 140/74 | HR 59 | Temp 97.3°F | Resp 16 | Ht 71.0 in | Wt 196.4 lb

## 2021-04-05 DIAGNOSIS — I700 Atherosclerosis of aorta: Secondary | ICD-10-CM | POA: Diagnosis not present

## 2021-04-05 DIAGNOSIS — E1165 Type 2 diabetes mellitus with hyperglycemia: Secondary | ICD-10-CM | POA: Diagnosis not present

## 2021-04-05 DIAGNOSIS — I10 Essential (primary) hypertension: Secondary | ICD-10-CM | POA: Diagnosis not present

## 2021-04-05 DIAGNOSIS — E782 Mixed hyperlipidemia: Secondary | ICD-10-CM | POA: Diagnosis not present

## 2021-04-05 LAB — POCT GLYCOSYLATED HEMOGLOBIN (HGB A1C): Hemoglobin A1C: 5.2 % (ref 4.0–5.6)

## 2021-04-05 NOTE — Progress Notes (Signed)
[facility] [place], [place] [address]  Internal MEDICINE  Office Visit Note  Patient Name: Mark Wyatt  [id number]  [id number]  Date of Service: 04/12/2021  Chief Complaint  Patient presents with   Follow-up    refills   Diabetes   Hyperlipidemia   Hypertension   Quality Metric Gaps    shingrix    HPI Mark Wyatt presents for a follow up visit for diabetes and A1C check. He is requesting refills as well. He has a history of hyperlipidemia, hypertension, MI, and diabetes. He was recently hospitalized for a gastrointestinal bleed and anemia and was transfused 2 units of pRBCs.  Patient reports he does drink alcohol on a weekly basis prior to the GI bleed and his wife who accompanied him to the office visit today states he has started drink again since he has been discharged from the hospital.  -discussed the risks and complications associated with alcohol use and GI bleeding, patient acknowledged understanding of risks and complications.  -most recent bilateral carotid ultrasound shows 50-70% stenosis on the right and less than 50% stenosis on the left. Recommended to repeat carotid US in 6-12 months. Last echocardiogram was in 2018, consider repeat echo in the next 6 months.  -A1C was 5.2 today. This was an improvement from 6.0 3 months ago.      Stop metformin Caution with drink due to recent gi bleed, recent anemia, was transfused 2 units.     Current Medication: Outpatient Encounter Medications as of 04/05/2021  Medication Sig   amLODipine (NORVASC) 2.5 MG tablet Take 1 tablet (2.5 mg total) by mouth daily.   ASPIRIN 81 PO Take by mouth.   atorvastatin (LIPITOR) 40 MG tablet Take 1 tablet (40 mg total) by mouth daily at 6 PM.   Blood Glucose Monitoring Suppl (FREESTYLE LITE) w/Device KIT 1 each by Does not apply route daily. Use as directed   glucose blood (FREESTYLE LITE) test strip Use as instructed as needed once a day DX: E11.65   Lancets 30G MISC  Use as directed once a day DX impaired fasting glucose   nitroGLYCERIN (NITROSTAT) 0.4 MG SL tablet Place 1 tablet (0.4 mg total) under the tongue every 5 (five) minutes as needed for chest pain.   pantoprazole (PROTONIX) 40 MG tablet Take 1 tablet (40 mg total) by mouth 2 (two) times daily.   tadalafil (CIALIS) 20 MG tablet 1 tab 1 hour prior to intercourse   [DISCONTINUED] metFORMIN (GLUCOPHAGE-XR) 500 MG 24 hr tablet Take 1 tablet (500 mg total) by mouth daily with breakfast.   [DISCONTINUED] metoprolol tartrate (LOPRESSOR) 25 MG tablet Take 1 tablet (25 mg total) by mouth 2 (two) times daily.   [DISCONTINUED] trospium (SANCTURA) 20 MG tablet TAKE 1 TABLET(20 MG) BY MOUTH TWICE DAILY   No facility-administered encounter medications on file as of 04/05/2021.    Surgical History: Past Surgical History:  Procedure Laterality Date   ANAL FISTULOTOMY     CARDIAC CATHETERIZATION     COLONOSCOPY WITH PROPOFOL N/A 02/13/2021   Procedure: COLONOSCOPY WITH PROPOFOL;  Surgeon: [name], MD;  Location: [facility] ENDOSCOPY;  Service: Gastroenterology;  Laterality: N/A;   CORONARY STENT INTERVENTION N/A 02/24/2017   Procedure: Coronary Stent Intervention;  Surgeon: [name], MD;  Location: [place] CV LAB;  Service: Cardiovascular;  Laterality: N/A;   ESOPHAGOGASTRODUODENOSCOPY N/A 02/10/2021   Procedure: ESOPHAGOGASTRODUODENOSCOPY (EGD);  Surgeon: [name], MD;  Location: [facility] ENDOSCOPY;  Service: Gastroenterology;  Laterality: N/A;  ESOPHAGOGASTRODUODENOSCOPY (EGD) WITH PROPOFOL N/A 01/25/2021   Procedure: ESOPHAGOGASTRODUODENOSCOPY (EGD) WITH PROPOFOL;  Surgeon: [name], MD;  Location: [facility] ENDOSCOPY;  Service: Endoscopy;  Laterality: N/A;   GIVENS CAPSULE STUDY N/A 02/21/2021   Procedure: GIVENS CAPSULE STUDY;  Surgeon: [name], MD;  Location: [facility] ENDOSCOPY;  Service: Endoscopy;  Laterality: N/A;   LEFT HEART CATH AND CORONARY ANGIOGRAPHY N/A 02/24/2017    Procedure: Left Heart Cath and Coronary Angiography;  Surgeon: [name], MD;  Location: [place] CV LAB;  Service: Cardiovascular;  Laterality: N/A;    Medical History: Past Medical History:  Diagnosis Date   Diabetes mellitus without complication ([place])    Gout    Hyperlipidemia    Hypertension    Myocardial infarction ([place])    mild   Overactive bladder    [place] [facility]   Trigger finger of all digits of right hand     Family History: Family History  Problem Relation Age of Onset   Diabetes Mother     Social History   Socioeconomic History   Marital status: Married    Spouse name: Not on file   Number of children: Not on file   Years of education: Not on file   Highest education level: Not on file  Occupational History   Not on file  Tobacco Use   Smoking status: Former    Pack years: 0.00   Smokeless tobacco: Never  Vaping Use   Vaping Use: Never used  Substance and Sexual Activity   Alcohol use: Yes    Alcohol/week: 6.0 standard drinks    Types: 6 Cans of beer per week    Comment: 1 or 2 with dinner   Drug use: No   Sexual activity: Not on file  Other Topics Concern   Not on file  Social History Narrative   Not on file   Social Determinants of Health   Financial Resource Strain: Not on file  Food Insecurity: Not on file  Transportation Needs: Not on file  Physical Activity: Not on file  Stress: Not on file  Social Connections: Not on file  Intimate Partner Violence: Not on file      Review of Systems  Constitutional:  Negative for chills, fatigue and unexpected weight change.  HENT:  Negative for congestion, rhinorrhea, sneezing and sore throat.   Eyes:  Negative for redness.  Respiratory:  Negative for cough, chest tightness and shortness of breath.   Cardiovascular:  Negative for chest pain and palpitations.  Gastrointestinal:  Negative for abdominal pain, constipation, diarrhea, nausea and vomiting.  Genitourinary:   Negative for dysuria and frequency.  Musculoskeletal:  Negative for arthralgias, back pain, joint swelling and neck pain.  Skin:  Negative for rash.  Neurological: Negative.  Negative for tremors and numbness.  Hematological:  Negative for adenopathy. Does not bruise/bleed easily.  Psychiatric/Behavioral:  Negative for behavioral problems (Depression), sleep disturbance and suicidal ideas. The patient is not nervous/anxious.    Vital Signs: BP 140/74   Pulse (!) 59   Temp (!) 97.3 F (36.3 C)   Resp 16   Ht _0  (1.803 m)   Wt 196 lb 6.4 oz (89.1 kg)   SpO2 99%   BMI 27.39 kg/m    Physical Exam Vitals reviewed.  Constitutional:      General: He is not in acute distress.    Appearance: Normal appearance. He is well-developed. He is obese. He is not ill-appearing or diaphoretic.  HENT:  Head: Normocephalic and atraumatic.  Neck:     Thyroid: No thyromegaly.     Vascular: No JVD.     Trachea: No tracheal deviation.  Cardiovascular:     Rate and Rhythm: Normal rate and regular rhythm.     Pulses: Normal pulses.     Heart sounds: Normal heart sounds. No murmur heard.   No friction rub. No gallop.  Pulmonary:     Effort: Pulmonary effort is normal. No respiratory distress.     Breath sounds: Normal breath sounds. No wheezing or rales.  Chest:     Chest wall: No tenderness.  Skin:    General: Skin is warm and dry.     Capillary Refill: Capillary refill takes less than 2 seconds.  Neurological:     Mental Status: He is alert and oriented to person, place, and time.  Psychiatric:        Mood and Affect: Mood normal.        Behavior: Behavior normal.   Assessment/Plan: 1. Type 2 diabetes mellitus with hyperglycemia, without long-term current use of insulin (HCC) Patient's most recent A1C is 5.2 which is normal. He is taking metformin once a day. Discontinue metformin. Patient encouraged to continue limiting high carb and high sugar foods and drink and to increase lean  protein. Patient encouraged to be active and move more. Will recheck A1C in 3 months, if it is still normal, will space out A1C check to 6 months.  - POCT HgB A1C  2. Essential hypertension Stable with current medication, patient is taking amlodipine 2.5 mg daily.   3. Aortic atherosclerosis ([place]) Patient is taking atorvastatin 40 mg daily, and aspirin 81 mg daily. He has had a prior MI in 2018. He has nitroglycerin tablets as needed for chest pain. He most recent bilateral carotid ultrasound was done in November 2021. Repeat carotid ultrasound is recommended in 6-12 months.   4. Mixed hyperlipidemia Prior lipid panel is elevated, patient is currently taking atorvastatin 40 mg daily.    General Counseling: Aviyon verbalizes understanding of the findings of todays visit and agrees with plan of treatment. I have discussed any further diagnostic evaluation that may be needed or ordered today. We also reviewed his medications today. he has been encouraged to call the office with any questions or concerns that should arise related to todays visit.    Orders Placed This Encounter  Procedures   POCT HgB A1C    No orders of the defined types were placed in this encounter.   Return in about 3 months (around 07/06/2021) for F/U, Recheck A1C, Gyanna Jarema PCP.   Total time spent:30 Minutes Time spent includes review of chart, medications, test results, and follow up plan with the patient.   [place] Controlled Substance Database was reviewed by me.  This patient was seen by Jonetta Osgood, FNP-C in collaboration with Dr. Clayborn Bigness as a part of collaborative care agreement.   Arben Packman R. Valetta Fuller, MSN, FNP-C Internal medicine

## 2021-04-06 ENCOUNTER — Ambulatory Visit
Admission: RE | Admit: 2021-04-06 | Discharge: 2021-04-06 | Disposition: A | Discharge status: Home / Self Care | Payer: Medicare Other | Source: Ambulatory Visit | Attending: Unknown Physician Specialty | Admitting: Unknown Physician Specialty

## 2021-04-06 DIAGNOSIS — E041 Nontoxic single thyroid nodule: Secondary | ICD-10-CM | POA: Insufficient documentation

## 2021-04-11 ENCOUNTER — Other Ambulatory Visit: Payer: Self-pay | Admitting: Unknown Physician Specialty

## 2021-04-11 DIAGNOSIS — E041 Nontoxic single thyroid nodule: Secondary | ICD-10-CM

## 2021-04-12 DIAGNOSIS — I700 Atherosclerosis of aorta: Secondary | ICD-10-CM | POA: Insufficient documentation

## 2021-04-12 DIAGNOSIS — E1165 Type 2 diabetes mellitus with hyperglycemia: Secondary | ICD-10-CM | POA: Insufficient documentation

## 2021-05-10 ENCOUNTER — Telehealth: Payer: Self-pay

## 2021-05-10 NOTE — Telephone Encounter (Signed)
Spoke with patient and he is doing much better and does not feel he needs an appointment. Advised patient if symptoms persist or get worse to contact our office. Mark Wyatt

## 2021-05-23 ENCOUNTER — Encounter: Payer: Self-pay | Admitting: Urology

## 2021-05-23 ENCOUNTER — Other Ambulatory Visit: Payer: Self-pay

## 2021-05-23 ENCOUNTER — Ambulatory Visit (INDEPENDENT_AMBULATORY_CARE_PROVIDER_SITE_OTHER): Payer: Medicare Other | Admitting: Urology

## 2021-05-23 VITALS — BP 158/87 | HR 76 | Ht 71.0 in | Wt 185.0 lb

## 2021-05-23 DIAGNOSIS — N401 Enlarged prostate with lower urinary tract symptoms: Secondary | ICD-10-CM | POA: Diagnosis not present

## 2021-05-23 DIAGNOSIS — N5201 Erectile dysfunction due to arterial insufficiency: Secondary | ICD-10-CM | POA: Diagnosis not present

## 2021-05-23 DIAGNOSIS — R351 Nocturia: Secondary | ICD-10-CM

## 2021-05-23 MED ORDER — TADALAFIL 20 MG PO TABS: ORAL_TABLET | ORAL | 3 refills | Status: DC

## 2021-05-23 NOTE — Progress Notes (Signed)
05/23/2021 10:43 AM   Mark Wyatt [birth year] [id number]  Referring provider: Luiz Ochoa, NP No address on file  Chief Complaint  Patient presents with   Benign Prostatic Hypertrophy    HPI: 70 y.o. male presents for 6 month follow-up.  Had initially been seen for moderate to severe storage related voiding symptoms and nocturia x4 No improvement on tamsulosin/Myrbetriq He noted significant improvement in his symptoms on a follow-up February 2022 with decreasing his water and caffeine intake He continues to do quite well with dietary modification and has no bothersome LUTS Is on tadalafil for ED. IPSS today 13/35 with bother rated 2/6  PMH: Past Medical History:  Diagnosis Date   Diabetes mellitus without complication ([place])    Gout    Hyperlipidemia    Hypertension    Myocardial infarction ([place])    mild   Overactive bladder    [name] [facility]   Trigger finger of all digits of right hand     Surgical History: Past Surgical History:  Procedure Laterality Date   ANAL FISTULOTOMY     CARDIAC CATHETERIZATION     COLONOSCOPY WITH PROPOFOL N/A 02/13/2021   Procedure: COLONOSCOPY WITH PROPOFOL;  Surgeon: [name], MD;  Location: [facility] ENDOSCOPY;  Service: Gastroenterology;  Laterality: N/A;   CORONARY STENT INTERVENTION N/A 02/24/2017   Procedure: Coronary Stent Intervention;  Surgeon: [name], MD;  Location: [place] CV LAB;  Service: Cardiovascular;  Laterality: N/A;   ESOPHAGOGASTRODUODENOSCOPY N/A 02/10/2021   Procedure: ESOPHAGOGASTRODUODENOSCOPY (EGD);  Surgeon: [name], MD;  Location[facility] ENDOSCOPY;  Service: Gastroenterology;  Laterality: N/A;   ESOPHAGOGASTRODUODENOSCOPY (EGD) WITH PROPOFOL N/A 01/25/2021   Procedure: ESOPHAGOGASTRODUODENOSCOPY (EGD) WITH PROPOFOL;  Surgeon: [name], MD;  Location: [facility] ENDOSCOPY;  Service: Endoscopy;  Laterality: N/A;   GIVENS CAPSULE STUDY N/A 02/21/2021   Procedure: GIVENS  CAPSULE STUDY;  Surgeon: [name], MD;  Location: [facility] ENDOSCOPY;  Service: Endoscopy;  Laterality: N/A;   LEFT HEART CATH AND CORONARY ANGIOGRAPHY N/A 02/24/2017   Procedure: Left Heart Cath and Coronary Angiography;  Surgeon: [name], MD;  Location: [place] CV LAB;  Service: Cardiovascular;  Laterality: N/A;    Home Medications:  Allergies as of 05/23/2021       Reactions   Penicillins Other (See Comments)   Headache Headache   Aspirin Other (See Comments)   02/05/2020-pt has been taking baby aspirin before bed and tolerating well.        Medication List        Accurate as of May 23, 2021 10:43 AM. If you have any questions, ask your nurse or doctor.          amLODipine 2.5 MG tablet Commonly known as: NORVASC Take 1 tablet (2.5 mg total) by mouth daily.   ASPIRIN 81 PO Take by mouth.   atorvastatin 40 MG tablet Commonly known as: LIPITOR Take 1 tablet (40 mg total) by mouth daily at 6 PM.   FREESTYLE LITE test strip Generic drug: glucose blood Use as instructed as needed once a day DX: E11.65   FreeStyle Lite w/Device Kit 1 each by Does not apply route daily. Use as directed   Lancets 30G Misc Use as directed once a day DX impaired fasting glucose   nitroGLYCERIN 0.4 MG SL tablet Commonly known as: NITROSTAT Place 1 tablet (0.4 mg total) under the tongue every 5 (five) minutes as needed for chest pain.   pantoprazole 40 MG tablet Commonly known as:  PROTONIX Take 1 tablet (40 mg total) by mouth 2 (two) times daily.   tadalafil 20 MG tablet Commonly known as: CIALIS 1 tab 1 hour prior to intercourse        Allergies:  Allergies  Allergen Reactions   Penicillins Other (See Comments)    Headache  Headache   Aspirin Other (See Comments)    02/05/2020-pt has been taking baby aspirin before bed and tolerating well.    Family History: Family History  Problem Relation Age of Onset   Diabetes Mother     Social History:   reports that he has quit smoking. He has never used smokeless tobacco. He reports current alcohol use of about 6.0 standard drinks of alcohol per week. He reports that he does not use drugs.   Physical Exam: BP (!) 158/87   Pulse 76   Ht '5\' 11"'  (1.803 m)   Wt 185 lb (83.9 kg)   BMI 25.80 kg/m   Constitutional:  Alert and oriented, No acute distress. HEENT: [place] AT, moist mucus membranes.  Trachea midline, no masses. Cardiovascular: No clubbing, cyanosis, or edema. Respiratory: Normal respiratory effort, no increased work of breathing.    Assessment & Plan:    1.  Lower urinary tract symptoms Stable with dietary modification  2.  Erectile dysfunction Stable on tadalafil, refill sent to [facility], [place] [address], [place], [place] [address] [phone]

## 2021-05-28 ENCOUNTER — Other Ambulatory Visit: Payer: Self-pay | Admitting: Physician Assistant

## 2021-05-28 DIAGNOSIS — N401 Enlarged prostate with lower urinary tract symptoms: Secondary | ICD-10-CM

## 2021-05-29 ENCOUNTER — Other Ambulatory Visit: Payer: Self-pay

## 2021-05-31 ENCOUNTER — Other Ambulatory Visit: Payer: Self-pay | Admitting: Family Medicine

## 2021-05-31 MED ORDER — TADALAFIL 20 MG PO TABS: ORAL_TABLET | ORAL | 3 refills | Status: DC

## 2021-06-06 ENCOUNTER — Other Ambulatory Visit: Payer: Self-pay

## 2021-06-06 ENCOUNTER — Ambulatory Visit (INDEPENDENT_AMBULATORY_CARE_PROVIDER_SITE_OTHER): Payer: Medicare Other | Admitting: Gastroenterology

## 2021-06-06 VITALS — BP 163/78 | HR 61 | Ht 71.0 in | Wt 200.0 lb

## 2021-06-06 DIAGNOSIS — R109 Unspecified abdominal pain: Secondary | ICD-10-CM

## 2021-06-06 DIAGNOSIS — D509 Iron deficiency anemia, unspecified: Secondary | ICD-10-CM

## 2021-06-06 DIAGNOSIS — K2980 Duodenitis without bleeding: Secondary | ICD-10-CM

## 2021-06-06 NOTE — Progress Notes (Signed)
Vonda Antigua, MD [address]  [place]  [place], [place] [address]  Main: [phone]  Fax: [phone]   Primary Care Physician: Lavera Guise, MD   Chief complaint: Anemia, abdominal pain  HPI: Mark Wyatt is a 70 y.o. male here for follow-up of abdominal pain and anemia.  Patient's hemoglobin most recently was completely normal at 13.4.  He does not have any further abdominal pain or any further evidence of GI bleed. The patient denies abdominal or flank pain, anorexia, nausea or vomiting, dysphagia, change in bowel habits or black or bloody stools or weight loss.     ROS: All ROS reviewed and negative except as per HPI   Past Medical History:  Diagnosis Date   Diabetes mellitus without complication ([place])    Gout    Hyperlipidemia    Hypertension    Myocardial infarction ([place])    mild   Overactive bladder    [facility]   Trigger finger of all digits of right hand     Past Surgical History:  Procedure Laterality Date   ANAL FISTULOTOMY     CARDIAC CATHETERIZATION     COLONOSCOPY WITH PROPOFOL N/A 02/13/2021   Procedure: COLONOSCOPY WITH PROPOFOL;  Surgeon: [name], MD;  Location: [facility] ENDOSCOPY;  Service: Gastroenterology;  Laterality: N/A;   CORONARY STENT INTERVENTION N/A 02/24/2017   Procedure: Coronary Stent Intervention;  Surgeon: [name], MD;  Location: [facility];  Service: Cardiovascular;  Laterality: N/A;   ESOPHAGOGASTRODUODENOSCOPY N/A 02/10/2021   Procedure: ESOPHAGOGASTRODUODENOSCOPY (EGD);  Surgeon: [name], MD;  [facility]COPY;  Service: Gastroenterology;  Laterality: N/A;   ESOPHAGOGASTRODUODENOSCOPY (EGD) WITH PROPOFOL N/A 01/25/2021   Procedure: ESOPHAGOGASTRODUODENOSCOPY (EGD) WITH PROPOFOL;  Surgeon: [name], MD;  Location: [facility] ENDOSCOPY;  Service: Endoscopy;  Laterality: N/A;   GIVENS CAPSULE STUDY N/A 02/21/2021   Procedure: GIVENS CAPSULE STUDY;  Surgeon: [name], MD;  Location: [facility] ENDOSCOPY;  Service: Endoscopy;  Laterality: N/A;   LEFT HEART CATH AND CORONARY ANGIOGRAPHY N/A 02/24/2017   Procedure: Left Heart Cath and Coronary Angiography;  Surgeon: [name], MD;  Location: [facility];  Service: Cardiovascular;  Laterality: N/A;    Prior to Admission medications   Medication Sig Start Date End Date Taking? Authorizing Provider  amLODipine (NORVASC) 2.5 MG tablet Take 1 tablet (2.5 mg total) by mouth daily. 09/25/20  Yes Lavera Guise, MD  ASPIRIN 81 PO Take by mouth.   Yes [provider]  atorvastatin (LIPITOR) 40 MG tablet Take 1 tablet (40 mg total) by mouth daily at 6 PM. 02/06/21  Yes Luiz Ochoa, NP  Blood Glucose Monitoring Suppl (FREESTYLE LITE) w/Device KIT 1 each by Does not apply route daily. Use as directed 09/08/20  Yes Lavera Guise, MD  glucose blood (FREESTYLE LITE) test strip Use as instructed as needed once a day DX: E11.65 03/16/21  Yes Lavera Guise, MD  Lancets 30G MISC Use as directed once a day DX impaired fasting glucose 09/08/20  Yes Lavera Guise, MD  nitroGLYCERIN (NITROSTAT) 0.4 MG SL tablet Place 1 tablet (0.4 mg total) under the tongue every 5 (five) minutes as needed for chest pain. 02/25/17  Yes Bettey Costa, MD  tadalafil (CIALIS) 20 MG tablet 1 tab 1 hour prior to intercourse 05/31/21  Yes Stoioff, Ronda Fairly, MD  pantoprazole (PROTONIX) 40 MG tablet Take 1 tablet (40 mg total) by mouth 2 (two) times daily.  03/13/21 04/12/21  [name], MD  metoprolol tartrate (LOPRESSOR) 25 MG tablet Take 1 tablet (25 mg total) by mouth 2 (two) times daily. 02/25/17 09/06/19  Bettey Costa, MD    Family History  Problem Relation Age of Onset   Diabetes Mother      Social History   Tobacco Use   Smoking status: Former   Smokeless tobacco: Never  Scientific laboratory technician Use: Never used  Substance Use Topics   Alcohol use: Yes    Alcohol/week: 6.0 standard drinks    Types: 6 Cans of beer per  week    Comment: 1 or 2 with dinner   Drug use: No    Allergies as of 06/06/2021 - Review Complete 06/06/2021  Allergen Reaction Noted   Penicillins Other (See Comments) 01/16/2011   Aspirin Other (See Comments) 02/05/2012    Physical Examination:  Constitutional: General:   Alert,  Well-developed, well-nourished, pleasant and cooperative in NAD BP (!) 163/78   Pulse 61   Ht _0  (1.803 m)   Wt 200 lb (90.7 kg)   BMI 27.89 kg/m   Respiratory: Normal respiratory effort  Gastrointestinal:  Soft, non-tender and non-distended without masses, hepatosplenomegaly or hernias noted.  No guarding or rebound tenderness.     Cardiac: No clubbing or edema.  No cyanosis. Normal posterior tibial pedal pulses noted.  Psych:  Alert and cooperative. Normal mood and affect.  Musculoskeletal:  Normal gait. Head normocephalic, atraumatic. Symmetrical without gross deformities. 5/5 Lower extremity strength bilaterally.  Skin: Warm. Intact without significant lesions or rashes. No jaundice.  Neck: Supple, trachea midline  Lymph: No cervical lymphadenopathy  Psych:  Alert and oriented x3, Alert and cooperative. Normal mood and affect.  Labs: CMP     Component Value Date/Time   NA 140 02/13/2021 0118   NA 134 01/09/2021 1008   K 3.4 (L) 02/13/2021 0118   CL 110 02/13/2021 0118   CO2 24 02/13/2021 0118   GLUCOSE 100 (H) 02/13/2021 0118   BUN <5 (L) 02/13/2021 0118   BUN 12 01/09/2021 1008   CREATININE 0.75 02/13/2021 0118   CALCIUM 8.1 (L) 02/13/2021 0118   PROT 6.1 (L) 02/09/2021 1222   PROT 7.1 01/09/2021 1008   ALBUMIN 3.3 (L) 02/09/2021 1222   ALBUMIN 4.1 01/09/2021 1008   AST 18 02/09/2021 1222   ALT 16 02/09/2021 1222   ALKPHOS 61 02/09/2021 1222   BILITOT 0.5 02/09/2021 1222   BILITOT 1.6 (H) 01/09/2021 1008   GFRNONAA >60 02/13/2021 0118   GFRAA 88 08/01/2020 1113   Lab Results  Component Value Date   WBC 5.7 03/21/2021   HGB 13.4 03/21/2021   HCT 41.4 03/21/2021    MCV 85.2 03/21/2021   PLT 296 03/21/2021    Imaging Studies:   Assessment and Plan:   Mark Wyatt is a 70 y.o. y/o male here for follow-up of anemia and abdominal pain  Patient's symptoms have completely resolved, with no further abdominal pain, and hemoglobin is now normal as well  His initial presentation of abdominal pain was likely due to the duodenitis seen on upper GI series that responded well to PPI  He was also previously on a combination of colchicine, prednisone and NSAID which may have contributed to his duodenitis  He also has history of AVMs in the small bowel  With no further evidence of anemia or recurrent bleeding, no further indication for endoscopic interventions at this time given that he has completed an upper  endoscopy, colonoscopy and small bowel capsule study  Avoid frequent NSAID use  We discussed clinic follow-up in 3 to 6 months to reassess if he has any further anemia or any other symptoms of concern.  However, patient declines to make a clinic follow-up and states that he will be sure to call us if he has any questions, or if her anemia reoccurs or if he has any abdominal pain or bleeding.  I encouraged him to give [place] a call as needed since he declines to make an appointment in the future.    Dr Vonda Antigua

## 2021-07-05 ENCOUNTER — Ambulatory Visit (INDEPENDENT_AMBULATORY_CARE_PROVIDER_SITE_OTHER): Payer: Medicare Other | Admitting: Nurse Practitioner

## 2021-07-05 ENCOUNTER — Other Ambulatory Visit: Payer: Self-pay

## 2021-07-05 ENCOUNTER — Other Ambulatory Visit: Payer: Self-pay | Admitting: Nurse Practitioner

## 2021-07-05 ENCOUNTER — Encounter: Payer: Self-pay | Admitting: Nurse Practitioner

## 2021-07-05 VITALS — BP 149/78 | HR 62 | Temp 98.4°F | Resp 16 | Ht 71.0 in | Wt 205.0 lb

## 2021-07-05 DIAGNOSIS — E1165 Type 2 diabetes mellitus with hyperglycemia: Secondary | ICD-10-CM

## 2021-07-05 DIAGNOSIS — I10 Essential (primary) hypertension: Secondary | ICD-10-CM

## 2021-07-05 LAB — POCT GLYCOSYLATED HEMOGLOBIN (HGB A1C): Hemoglobin A1C: 6.1 % — AB (ref 4.0–5.6)

## 2021-07-05 MED ORDER — LOSARTAN POTASSIUM 50 MG PO TABS: 50.0000 mg | ORAL_TABLET | Freq: Every day | ORAL | 0 refills | Status: DC

## 2021-07-05 NOTE — Progress Notes (Signed)
[facility] [place], [place] [address]  Internal MEDICINE  Office Visit Note  Patient Name: Mark Wyatt  [id number]  [id number]  Date of Service: 07/05/2021  Chief Complaint  Patient presents with   Follow-up    Discuss meds   Diabetes   Hyperlipidemia   Hypertension   Quality Metric Gaps    Tdap was done pt will get records    HPI Mark Wyatt presents for a follow up visit for hypertension and diabetes. He is accompanied by his wife. At his previous visit, in June, his A1C had decreased to 5.2. The patient wanted to stop metformin and control his glucose and A1C with diet. This was a reasonable request so his metformin was discontinued. Today, his A1C has increased to 6.1 but this is still stable.  His blood pressure was significantly elevated, improved some when rechecked, see vitals.    Current Medication: Outpatient Encounter Medications as of 07/05/2021  Medication Sig   ASPIRIN 81 PO Take by mouth.   atorvastatin (LIPITOR) 40 MG tablet Take 1 tablet (40 mg total) by mouth daily at 6 PM.   Blood Glucose Monitoring Suppl (FREESTYLE LITE) w/Device KIT 1 each by Does not apply route daily. Use as directed   glucose blood (FREESTYLE LITE) test strip Use as instructed as needed once a day DX: E11.65   Lancets 30G MISC Use as directed once a day DX impaired fasting glucose   losartan (COZAAR) 50 MG tablet Take 1 tablet (50 mg total) by mouth daily.   nitroGLYCERIN (NITROSTAT) 0.4 MG SL tablet Place 1 tablet (0.4 mg total) under the tongue every 5 (five) minutes as needed for chest pain.   tadalafil (CIALIS) 20 MG tablet 1 tab 1 hour prior to intercourse   [DISCONTINUED] amLODipine (NORVASC) 2.5 MG tablet Take 1 tablet (2.5 mg total) by mouth daily.   [DISCONTINUED] metoprolol tartrate (LOPRESSOR) 25 MG tablet Take 1 tablet (25 mg total) by mouth 2 (two) times daily.   [DISCONTINUED] pantoprazole (PROTONIX) 40 MG tablet Take 1 tablet (40 mg total) by mouth 2  (two) times daily.   No facility-administered encounter medications on file as of 07/05/2021.    Surgical History: Past Surgical History:  Procedure Laterality Date   ANAL FISTULOTOMY     CARDIAC CATHETERIZATION     COLONOSCOPY WITH PROPOFOL N/A 02/13/2021   Procedure: COLONOSCOPY WITH PROPOFOL;  Surgeon: [name], MD;  Location: [facility] ENDOSCOPY;  Service: Gastroenterology;  Laterality: N/A;   CORONARY STENT INTERVENTION N/A 02/24/2017   Procedure: Coronary Stent Intervention;  Surgeon: [name], MD;  Location: [facility];  Service: Cardiovascular;  Laterality: N/A;   ESOPHAGOGASTRODUODENOSCOPY N/A 02/10/2021   Procedure: ESOPHAGOGASTRODUODENOSCOPY (EGD);  Surgeon: [name], MD;  Location: [facility] ENDOSCOPY;  Service: Gastroenterology;  Laterality: N/A;   ESOPHAGOGASTRODUODENOSCOPY (EGD) WITH PROPOFOL N/A 01/25/2021   Procedure: ESOPHAGOGASTRODUODENOSCOPY (EGD) WITH PROPOFOL;  Surgeon: [name], MD;  Location: [facility] ENDOSCOPY;  Service: Endoscopy;  Laterality: N/A;   GIVENS CAPSULE STUDY N/A 02/21/2021   Procedure: GIVENS CAPSULE STUDY;  Surgeon: [name], MD;  Location: [facility] ENDOSCOPY;  Service: Endoscopy;  Laterality: N/A;   LEFT HEART CATH AND CORONARY ANGIOGRAPHY N/A 02/24/2017   Procedure: Left Heart Cath and Coronary Angiography;  Surgeon: [name], MD;  Location: [place] [facility];  Service: Cardiovascular;  Laterality: N/A;    Medical History: Past Medical History:  Diagnosis Date   Diabetes mellitus without complication ([place])  Gout    Hyperlipidemia    Hypertension    Myocardial infarction ([place])    mild   Overactive bladder    [facility]   Trigger finger of all digits of right hand     Family History: Family History  Problem Relation Age of Onset   Diabetes Mother     Social History   Socioeconomic History   Marital status: Married    Spouse name: Not on file   Number of children: Not on file    Years of education: Not on file   Highest education level: Not on file  Occupational History   Not on file  Tobacco Use   Smoking status: Former   Smokeless tobacco: Never  Vaping Use   Vaping Use: Never used  Substance and Sexual Activity   Alcohol use: Yes    Alcohol/week: 6.0 standard drinks    Types: 6 Cans of beer per week    Comment: 1 or 2 with dinner   Drug use: No   Sexual activity: Not on file  Other Topics Concern   Not on file  Social History Narrative   Not on file   Social Determinants of Health   Financial Resource Strain: Not on file  Food Insecurity: Not on file  Transportation Needs: Not on file  Physical Activity: Not on file  Stress: Not on file  Social Connections: Not on file  Intimate Partner Violence: Not on file      Review of Systems  Constitutional:  Negative for chills, fatigue and unexpected weight change.  HENT:  Negative for congestion, rhinorrhea, sneezing and sore throat.   Eyes:  Negative for redness.  Respiratory:  Negative for cough, chest tightness and shortness of breath.   Cardiovascular:  Negative for chest pain and palpitations.  Gastrointestinal:  Negative for abdominal pain, constipation, diarrhea, nausea and vomiting.  Genitourinary:  Negative for dysuria and frequency.  Musculoskeletal:  Negative for arthralgias, back pain, joint swelling and neck pain.  Skin:  Negative for rash.  Neurological: Negative.  Negative for tremors and numbness.  Hematological:  Negative for adenopathy. Does not bruise/bleed easily.  Psychiatric/Behavioral:  Negative for behavioral problems (Depression), sleep disturbance and suicidal ideas. The patient is not nervous/anxious.    Vital Signs: BP (!) 149/78 Comment: 170/90  Pulse 62 Comment: 55  Temp 98.4 F (36.9 C)   Resp 16   Ht _0  (1.803 m)   Wt 205 lb (93 kg)   SpO2 98%   BMI 28.59 kg/m    Physical Exam Vitals reviewed.  Constitutional:      General: He is not in acute  distress.    Appearance: Normal appearance. He is not ill-appearing.  HENT:     Head: Normocephalic and atraumatic.  Eyes:     Extraocular Movements: Extraocular movements intact.     Pupils: Pupils are equal, round, and reactive to light.  Cardiovascular:     Rate and Rhythm: Normal rate and regular rhythm.  Pulmonary:     Effort: Pulmonary effort is normal. No respiratory distress.  Neurological:     Mental Status: He is alert and oriented to person, place, and time.     Cranial Nerves: No cranial nerve deficit.     Coordination: Coordination normal.     Gait: Gait normal.  Psychiatric:        Mood and Affect: Mood normal.        Behavior: Behavior normal.       Assessment/Plan:  1. Essential hypertension Amlodipine stopped due to unwanted side effects, start losartan 50 mg daily. Follow up in 4 weeks.  - losartan (COZAAR) 50 MG tablet; Take 1 tablet (50 mg total) by mouth daily.  Dispense: 30 tablet; Refill: 0  2. Type 2 diabetes mellitus with hyperglycemia, without long-term current use of insulin (HCC) Continue diet and lifestyle modifications to control glucose and A1C. Repeat A1C in 3 months.  - POCT HgB A1C   General Counseling: Montay verbalizes understanding of the findings of todays visit and agrees with plan of treatment. I have discussed any further diagnostic evaluation that may be needed or ordered today. We also reviewed his medications today. he has been encouraged to call the office with any questions or concerns that should arise related to todays visit.    Orders Placed This Encounter  Procedures   POCT HgB A1C    Meds ordered this encounter  Medications   losartan (COZAAR) 50 MG tablet    Sig: Take 1 tablet (50 mg total) by mouth daily.    Dispense:  30 tablet    Refill:  0    Return in about 4 weeks (around 08/02/2021) for F/U, BP check, Ellysia Char PCP.   Total time spent:20 Minutes Time spent includes review of chart, medications, test results,  and follow up plan with the patient.   [place] Controlled Substance Database was reviewed by me.  This patient was seen by Jonetta Osgood, FNP-C in collaboration with Dr. Clayborn Bigness as a part of collaborative care agreement.   Ronneisha Jett R. Valetta Fuller, MSN, FNP-C Internal medicine

## 2021-07-30 ENCOUNTER — Other Ambulatory Visit: Payer: Self-pay

## 2021-07-30 ENCOUNTER — Encounter: Payer: Self-pay | Admitting: Nurse Practitioner

## 2021-07-30 ENCOUNTER — Ambulatory Visit (INDEPENDENT_AMBULATORY_CARE_PROVIDER_SITE_OTHER): Payer: Medicare Other | Admitting: Nurse Practitioner

## 2021-07-30 VITALS — BP 150/80 | HR 74 | Temp 98.4°F | Resp 16 | Ht 70.5 in | Wt 208.0 lb

## 2021-07-30 DIAGNOSIS — I10 Essential (primary) hypertension: Secondary | ICD-10-CM

## 2021-07-30 DIAGNOSIS — D500 Iron deficiency anemia secondary to blood loss (chronic): Secondary | ICD-10-CM | POA: Diagnosis not present

## 2021-07-30 DIAGNOSIS — I700 Atherosclerosis of aorta: Secondary | ICD-10-CM

## 2021-07-30 DIAGNOSIS — E782 Mixed hyperlipidemia: Secondary | ICD-10-CM

## 2021-07-30 DIAGNOSIS — Z23 Encounter for immunization: Secondary | ICD-10-CM | POA: Diagnosis not present

## 2021-07-30 DIAGNOSIS — R7303 Prediabetes: Secondary | ICD-10-CM

## 2021-07-30 DIAGNOSIS — E559 Vitamin D deficiency, unspecified: Secondary | ICD-10-CM

## 2021-07-30 DIAGNOSIS — Z0001 Encounter for general adult medical examination with abnormal findings: Secondary | ICD-10-CM

## 2021-07-30 DIAGNOSIS — E119 Type 2 diabetes mellitus without complications: Secondary | ICD-10-CM | POA: Diagnosis not present

## 2021-07-30 DIAGNOSIS — R300 Dysuria: Secondary | ICD-10-CM

## 2021-07-30 DIAGNOSIS — D509 Iron deficiency anemia, unspecified: Secondary | ICD-10-CM

## 2021-07-30 MED ORDER — ATORVASTATIN CALCIUM 40 MG PO TABS: 40.0000 mg | ORAL_TABLET | Freq: Every day | ORAL | 1 refills | Status: DC

## 2021-07-30 MED ORDER — LOSARTAN POTASSIUM 50 MG PO TABS: 50.0000 mg | ORAL_TABLET | Freq: Two times a day (BID) | ORAL | 2 refills | Status: DC

## 2021-07-30 NOTE — Progress Notes (Signed)
[facility] [place], [place] [address]  Internal MEDICINE  Office Visit Note  Patient Name: Mark Wyatt  [id number]  [id number]  Date of Service: 07/30/2021  Chief Complaint  Patient presents with   Medicare Wellness   Diabetes   Hypertension   trigger finger    Right hand ring finger    HPI Mark Wyatt presents for an annual well visit and physical exam. He is a well appearing 70 yo male.  He has a history of hypertension, diet-controlled diabetes, aortic atherosclerosis, hyperlipidemia and an MI in 2018 with coronary stent placement. At his previous office visit, amlodipine was stopped due to unwanted side effects and he was started on losartan 50 mg daily. His blood pressure is elevated at today's visit even when rechecked, see vitals.  -had 1 hospitalization in the past year for GI bleed and anemia. He received 2 units of pRBCs as discussed during his office visit in June 2022.  -In June, he reported that he had started drinking alcohol again despite having a GI bleed in April 2022. He reports that he drinks 6 cans of beer per week, usually 1 or 2 drinks with dinner. He continues to acknowledge being aware of the risks and possible complications of continued alcohol use.  -He is taking atorvastatin 40 mg daily for aortic atherosclerosis and hyperlipidemia. He is tolerating the medication without issue. His carotid ultrasound in November 2021 showed 50-70% stenosis of the right carotid artery with moderate to severe plaque formation on that side. The left carotid artery has less than 50% stenosis with mild plaque formation.  -His 4th finger on his right hand keeps locking up, he thinks that it could be trigger finger. He reports that it is constantly achy and throbbing. It causes a sharp pain when it locks up. He reports having trigger finger of multiple digits of his right hand but the 4th finger is the one that is bothering him currently.        Current  Medication: Outpatient Encounter Medications as of 07/30/2021  Medication Sig   ASPIRIN 81 PO Take by mouth.   Blood Glucose Monitoring Suppl (FREESTYLE LITE) w/Device KIT 1 each by Does not apply route daily. Use as directed   glucose blood (FREESTYLE LITE) test strip Use as instructed as needed once a day DX: E11.65   Lancets 30G MISC Use as directed once a day DX impaired fasting glucose   nitroGLYCERIN (NITROSTAT) 0.4 MG SL tablet Place 1 tablet (0.4 mg total) under the tongue every 5 (five) minutes as needed for chest pain.   tadalafil (CIALIS) 20 MG tablet 1 tab 1 hour prior to intercourse   [DISCONTINUED] atorvastatin (LIPITOR) 40 MG tablet Take 1 tablet (40 mg total) by mouth daily at 6 PM.   [DISCONTINUED] losartan (COZAAR) 50 MG tablet Take 1 tablet (50 mg total) by mouth daily.   atorvastatin (LIPITOR) 40 MG tablet Take 1 tablet (40 mg total) by mouth daily at 6 PM.   losartan (COZAAR) 50 MG tablet Take 1 tablet (50 mg total) by mouth 2 (two) times daily.   [DISCONTINUED] metoprolol tartrate (LOPRESSOR) 25 MG tablet Take 1 tablet (25 mg total) by mouth 2 (two) times daily.   No facility-administered encounter medications on file as of 07/30/2021.    Surgical History: Past Surgical History:  Procedure Laterality Date   ANAL FISTULOTOMY     CARDIAC CATHETERIZATION     COLONOSCOPY WITH PROPOFOL N/A 02/13/2021   Procedure: COLONOSCOPY WITH  PROPOFOL;  Surgeon: [name], MD;  Location: [facility] ENDOSCOPY;  Service: Gastroenterology;  Laterality: N/A;   CORONARY STENT INTERVENTION N/A 02/24/2017   Procedure: Coronary Stent Intervention;  Surgeon: [name], MD;  Location: [facility] CV LAB;  Service: Cardiovascular;  Laterality: N/A;   ESOPHAGOGASTRODUODENOSCOPY N/A 02/10/2021   Procedure: ESOPHAGOGASTRODUODENOSCOPY (EGD);  Surgeon: [name], MD;  Location: [facility] ENDOSCOPY;  Service: Gastroenterology;  Laterality: N/A;   ESOPHAGOGASTRODUODENOSCOPY (EGD) WITH PROPOFOL  N/A 01/25/2021   Procedure: ESOPHAGOGASTRODUODENOSCOPY (EGD) WITH PROPOFOL;  Surgeon: [name], MD;  Location: [facility] ENDOSCOPY;  Service: Endoscopy;  Laterality: N/A;   GIVENS CAPSULE STUDY N/A 02/21/2021   Procedure: GIVENS CAPSULE STUDY;  Surgeon: [name], MD;  Location: [facility] ENDOSCOPY;  Service: Endoscopy;  Laterality: N/A;   LEFT HEART CATH AND CORONARY ANGIOGRAPHY N/A 02/24/2017   Procedure: Left Heart Cath and Coronary Angiography;  Surgeon: [name], MD;  Location: [place] [facility];  Service: Cardiovascular;  Laterality: N/A;    Medical History: Past Medical History:  Diagnosis Date   Diabetes mellitus without complication ([place])    Gout    Hyperlipidemia    Hypertension    Myocardial infarction ([place])    mild   Overactive bladder    [facility]   Trigger finger of all digits of right hand     Family History: Family History  Problem Relation Age of Onset   Diabetes Mother     Social History   Socioeconomic History   Marital status: Married    Spouse name: Not on file   Number of children: Not on file   Years of education: Not on file   Highest education level: Not on file  Occupational History   Not on file  Tobacco Use   Smoking status: Former   Smokeless tobacco: Never  Vaping Use   Vaping Use: Never used  Substance and Sexual Activity   Alcohol use: Yes    Alcohol/week: 6.0 standard drinks    Types: 6 Cans of beer per week    Comment: 1 or 2 with dinner   Drug use: No   Sexual activity: Not on file  Other Topics Concern   Not on file  Social History Narrative   Not on file   Social Determinants of Health   Financial Resource Strain: Not on file  Food Insecurity: Not on file  Transportation Needs: Not on file  Physical Activity: Not on file  Stress: Not on file  Social Connections: Not on file  Intimate Partner Violence: Not on file      Review of Systems  Constitutional:  Negative for activity change,  appetite change, chills, fatigue, fever and unexpected weight change.  HENT: Negative.  Negative for congestion, ear pain, rhinorrhea, sore throat and trouble swallowing.   Eyes: Negative.   Respiratory: Negative.  Negative for cough, chest tightness, shortness of breath and wheezing.   Cardiovascular: Negative.  Negative for chest pain.  Gastrointestinal: Negative.  Negative for abdominal pain, blood in stool, constipation, diarrhea, nausea and vomiting.  Endocrine: Negative.   Genitourinary: Negative.  Negative for difficulty urinating, dysuria, frequency, hematuria and urgency.  Musculoskeletal:  Positive for arthralgias (trigger finger of 4th finger of right hand). Negative for back pain, joint swelling, myalgias and neck pain.  Skin: Negative.  Negative for rash and wound.  Allergic/Immunologic: Negative.  Negative for immunocompromised state.  Neurological: Negative.  Negative for dizziness, seizures, numbness and headaches.  Hematological: Negative.   Psychiatric/Behavioral:  Negative.  Negative for behavioral problems, self-injury and suicidal ideas. The patient is not nervous/anxious.    Vital Signs: BP (!) 150/80 Comment: 162/86  Pulse 74   Temp 98.4 F (36.9 C)   Resp 16   Ht 5' 10.5" (1.791 m)   Wt 208 lb (94.3 kg)   SpO2 99%   BMI 29.42 kg/m    Physical Exam Vitals reviewed.  Constitutional:      General: He is awake. He is not in acute distress.    Appearance: Normal appearance. He is well-developed, well-groomed and overweight. He is not ill-appearing or diaphoretic.  HENT:     Head: Normocephalic and atraumatic.     Right Ear: Tympanic membrane, ear canal and external ear normal.     Left Ear: Tympanic membrane, ear canal and external ear normal.     Nose: Nose normal. No congestion or rhinorrhea.     Mouth/Throat:     Lips: Pink.     Mouth: Mucous membranes are moist.     Pharynx: Oropharynx is clear. Uvula midline. No oropharyngeal exudate or posterior  oropharyngeal erythema.  Eyes:     General: Lids are normal. Vision grossly intact. Gaze aligned appropriately. No scleral icterus.       Right eye: No discharge.        Left eye: No discharge.     Extraocular Movements: Extraocular movements intact.     Conjunctiva/sclera: Conjunctivae normal.     Pupils: Pupils are equal, round, and reactive to light.     Funduscopic exam:    Right eye: Red reflex present.        Left eye: Red reflex present. Neck:     Thyroid: No thyromegaly.     Vascular: No JVD.     Trachea: Trachea and phonation normal. No tracheal deviation.  Cardiovascular:     Rate and Rhythm: Normal rate and regular rhythm.     Pulses:          Carotid pulses are 3+ on the right side and 3+ on the left side.      Radial pulses are 2+ on the right side and 2+ on the left side.       Dorsalis pedis pulses are 2+ on the right side and 2+ on the left side.       Posterior tibial pulses are 2+ on the right side and 2+ on the left side.     Heart sounds: Normal heart sounds, S1 normal and S2 normal. No murmur heard.   No friction rub. No gallop.  Pulmonary:     Effort: Pulmonary effort is normal. No accessory muscle usage or respiratory distress.     Breath sounds: Normal breath sounds and air entry. No stridor. No wheezing or rales.  Chest:     Chest wall: No tenderness.  Abdominal:     General: Bowel sounds are normal. There is no distension.     Palpations: Abdomen is soft. There is no shifting dullness, fluid wave, mass or pulsatile mass.     Tenderness: There is no abdominal tenderness. There is no guarding or rebound.  Musculoskeletal:        General: No tenderness.     Right hand: Decreased range of motion (4th digit distal and medial interphalangeal joints locking up and causing pain when in flexion).     Cervical back: Normal range of motion and neck supple.     Right lower leg: No edema.     Left  lower leg: No edema.     Right foot: Normal range of motion. No  deformity, bunion, Charcot foot, foot drop or prominent metatarsal heads.     Left foot: Normal range of motion. No deformity, bunion, Charcot foot, foot drop or prominent metatarsal heads.  Feet:     Right foot:     Protective Sensation: 6 sites tested.  6 sites sensed.     Skin integrity: Dry skin present. No ulcer, blister, skin breakdown, erythema, warmth, callus or fissure.     Toenail Condition: Right toenails are abnormally thick and long.     Left foot:     Protective Sensation: 6 sites tested.  6 sites sensed.     Skin integrity: Dry skin present. No ulcer, blister, skin breakdown, erythema, warmth, callus or fissure.     Toenail Condition: Left toenails are abnormally thick and long.  Lymphadenopathy:     Cervical: No cervical adenopathy.  Skin:    General: Skin is warm and dry.     Capillary Refill: Capillary refill takes less than 2 seconds.     Coloration: Skin is not pale.     Findings: No erythema or rash.  Neurological:     Mental Status: He is alert and oriented to person, place, and time.     Cranial Nerves: No cranial nerve deficit.     Motor: No abnormal muscle tone.     Coordination: Coordination normal.     Gait: Gait normal.     Deep Tendon Reflexes: Reflexes are normal and symmetric.  Psychiatric:        Mood and Affect: Mood and affect normal.        Behavior: Behavior normal. Behavior is cooperative.        Thought Content: Thought content normal.        Judgment: Judgment normal.       Assessment/Plan: 1. Encounter for general adult medical examination with abnormal findings Age-appropriate preventive screenings and vaccinations discussed, annual physical exam completed. Routine labs for health maintenance ordered, see below. PHM updated.   2. Essential hypertension Blood pressure is not adequately controlled, losartan increased from once daily to twice daily. Follow up in 4 weeks.  - losartan (COZAAR) 50 MG tablet; Take 1 tablet (50 mg total) by  mouth 2 (two) times daily.  Dispense: 60 tablet; Refill: 2  3. Aortic atherosclerosis (HCC) On statin therapy, repeat lipid panel ordered. He also takes ASA 81 mg daily. His most recent carotid ultrasound was in November 2021. And his last echocardiogram was in 2018. Will discuss repeat carotid ultrasound and echocardiogram at next office visit. - atorvastatin (LIPITOR) 40 MG tablet; Take 1 tablet (40 mg total) by mouth daily at 6 PM.  Dispense: 90 tablet; Refill: 1 - Lipid Profile  4. Type 2 diabetes mellitus without complication, without long-term current use of insulin ([place]) Diet controlled, metformin was stopped in June 2022. A1C is stable, will repeat A1C in December.  - CMP14+EGFR  5. Iron deficiency anemia due to chronic blood loss Anemia due to GI bleed in April this year that required hospitalization, repeat CBC.  - CBC with Differential/Platelet  6. Vitamin D deficiency Routine lab ordered, rule out low vitamin D - Vitamin D (25 hydroxy)  7. Dysuria Routine urinalysis done - UA/M w/rflx Culture, Routine - Microscopic Examination  8. Needs flu shot Administered in office today - Flu Vaccine MDCK QUAD PF     General Counseling: Mark Wyatt verbalizes understanding of the findings of  todays visit and agrees with plan of treatment. I have discussed any further diagnostic evaluation that may be needed or ordered today. We also reviewed his medications today. he has been encouraged to call the office with any questions or concerns that should arise related to todays visit.    Orders Placed This Encounter  Procedures   Microscopic Examination   Flu Vaccine MDCK QUAD PF   Lipid Profile   Vitamin D (25 hydroxy)   CMP14+EGFR   CBC with Differential/Platelet   UA/M w/rflx Culture, Routine    Meds ordered this encounter  Medications   atorvastatin (LIPITOR) 40 MG tablet    Sig: Take 1 tablet (40 mg total) by mouth daily at 6 PM.    Dispense:  90 tablet    Refill:  1    losartan (COZAAR) 50 MG tablet    Sig: Take 1 tablet (50 mg total) by mouth 2 (two) times daily.    Dispense:  60 tablet    Refill:  2    Return in about 6 months (around 01/28/2022) for F/U, med refill, Mark Wyatt PCP.   Total time spent:30 Minutes Time spent includes review of chart, medications, test results, and follow up plan with the patient.   [place] Controlled Substance Database was reviewed by me.  This patient was seen by Jonetta Osgood, FNP-C in collaboration with Dr. Clayborn Bigness as a part of collaborative care agreement.  Mark Flett R. Valetta Fuller, MSN, FNP-C Internal medicine

## 2021-07-31 LAB — CBC WITH DIFFERENTIAL/PLATELET
Basophils Absolute: 0 10*3/uL (ref 0.0–0.2)
Basos: 1 %
EOS (ABSOLUTE): 0.2 10*3/uL (ref 0.0–0.4)
Eos: 3 %
Hematocrit: 49.1 % (ref 37.5–51.0)
Hemoglobin: 15.6 g/dL (ref 13.0–17.7)
Immature Grans (Abs): 0 10*3/uL (ref 0.0–0.1)
Immature Granulocytes: 0 %
Lymphocytes Absolute: 1.6 10*3/uL (ref 0.7–3.1)
Lymphs: 25 %
MCH: 26.9 pg (ref 26.6–33.0)
MCHC: 31.8 g/dL (ref 31.5–35.7)
MCV: 85 fL (ref 79–97)
Monocytes Absolute: 0.6 10*3/uL (ref 0.1–0.9)
Monocytes: 9 %
Neutrophils Absolute: 4 10*3/uL (ref 1.4–7.0)
Neutrophils: 62 %
Platelets: 270 10*3/uL (ref 150–450)
RBC: 5.81 x10E6/uL — ABNORMAL HIGH (ref 4.14–5.80)
RDW: 15.9 % — ABNORMAL HIGH (ref 11.6–15.4)
WBC: 6.4 10*3/uL (ref 3.4–10.8)

## 2021-07-31 LAB — UA/M W/RFLX CULTURE, ROUTINE
Bilirubin, UA: NEGATIVE
Glucose, UA: NEGATIVE
Ketones, UA: NEGATIVE
Leukocytes,UA: NEGATIVE
Nitrite, UA: NEGATIVE
Protein,UA: NEGATIVE
RBC, UA: NEGATIVE
Specific Gravity, UA: 1.017 (ref 1.005–1.030)
Urobilinogen, Ur: 1 mg/dL (ref 0.2–1.0)
pH, UA: 6 (ref 5.0–7.5)

## 2021-07-31 LAB — MICROSCOPIC EXAMINATION
Bacteria, UA: NONE SEEN
Casts: NONE SEEN /lpf
Epithelial Cells (non renal): NONE SEEN /hpf (ref 0–10)
WBC, UA: NONE SEEN /hpf (ref 0–5)

## 2021-07-31 LAB — CMP14+EGFR
ALT: 22 IU/L (ref 0–44)
AST: 22 IU/L (ref 0–40)
Albumin/Globulin Ratio: 1.6 (ref 1.2–2.2)
Albumin: 4.9 g/dL — ABNORMAL HIGH (ref 3.7–4.7)
Alkaline Phosphatase: 141 IU/L — ABNORMAL HIGH (ref 44–121)
BUN/Creatinine Ratio: 13 (ref 10–24)
BUN: 13 mg/dL (ref 8–27)
Bilirubin Total: 0.9 mg/dL (ref 0.0–1.2)
CO2: 24 mmol/L (ref 20–29)
Calcium: 9.6 mg/dL (ref 8.6–10.2)
Chloride: 104 mmol/L (ref 96–106)
Creatinine, Ser: 0.99 mg/dL (ref 0.76–1.27)
Globulin, Total: 3 g/dL (ref 1.5–4.5)
Glucose: 117 mg/dL — ABNORMAL HIGH (ref 70–99)
Potassium: 4.3 mmol/L (ref 3.5–5.2)
Sodium: 143 mmol/L (ref 134–144)
Total Protein: 7.9 g/dL (ref 6.0–8.5)
eGFR: 81 mL/min/{1.73_m2} (ref >59)

## 2021-07-31 LAB — LIPID PANEL
Chol/HDL Ratio: 2.6 ratio (ref 0.0–5.0)
Cholesterol, Total: 111 mg/dL (ref 100–199)
HDL: 43 mg/dL (ref >39)
LDL Chol Calc (NIH): 52 mg/dL (ref 0–99)
Triglycerides: 81 mg/dL (ref 0–149)
VLDL Cholesterol Cal: 16 mg/dL (ref 5–40)

## 2021-07-31 LAB — VITAMIN D 25 HYDROXY (VIT D DEFICIENCY, FRACTURES): Vit D, 25-Hydroxy: 36.2 ng/mL (ref 30.0–100.0)

## 2021-08-02 ENCOUNTER — Ambulatory Visit: Payer: Medicare Other | Admitting: Nurse Practitioner

## 2021-08-14 ENCOUNTER — Encounter: Payer: Self-pay | Admitting: Nurse Practitioner

## 2021-09-21 NOTE — Progress Notes (Signed)
[place]  Telephone:([phone] Fax:([phone]) [phone]  ID: Mark Wyatt OB: Sep 08, 1951  MR#: [id number]  XAJ#:[id number]  Patient Care Team: Lavera Guise, MD as PCP - General (Internal Medicine)  CHIEF COMPLAINT: Iron deficiency anemia secondary to GI bleed.  INTERVAL HISTORY: Patient returns to clinic today for repeat laboratory work, further evaluation, consideration of IV iron.  He currently feels well and is asymptomatic.  He denies any further melena or hematochezia.  He denies any weakness or fatigue.  He has no neurologic complaints.  He denies any recent fevers or illnesses.  He has a good appetite and reports a history weight loss, but now is gaining weight.  He has no chest pain, shortness of breath, cough, or hemoptysis.  He denies any nausea, vomiting, constipation, or diarrhea.  He has no urinary complaints.  Patient feels at his baseline and offers no specific complaints today.  REVIEW OF SYSTEMS:   Review of Systems  Constitutional: Negative.  Negative for fever, malaise/fatigue and weight loss.  Respiratory: Negative.  Negative for cough, hemoptysis and shortness of breath.   Cardiovascular: Negative.  Negative for chest pain and leg swelling.  Gastrointestinal: Negative.  Negative for abdominal pain, blood in stool and melena.  Genitourinary: Negative.  Negative for dysuria.  Musculoskeletal: Negative.  Negative for back pain.  Skin: Negative.  Negative for rash.  Neurological: Negative.  Negative for dizziness, focal weakness, weakness and headaches.  Psychiatric/Behavioral: Negative.  The patient is not nervous/anxious.    As per HPI. Otherwise, a complete review of systems is negative.  PAST MEDICAL HISTORY: Past Medical History:  Diagnosis Date   Diabetes mellitus without complication ([place])    Gout    Hyperlipidemia    Hypertension    Myocardial infarction ([place])    mild   Overactive bladder    [facility]   Trigger finger of  all digits of right hand     PAST SURGICAL HISTORY: Past Surgical History:  Procedure Laterality Date   ANAL FISTULOTOMY     CARDIAC CATHETERIZATION     COLONOSCOPY WITH PROPOFOL N/A 02/13/2021   Procedure: COLONOSCOPY WITH PROPOFOL;  Surgeon: [name], MD;  Location: [facility] ENDOSCOPY;  Service: Gastroenterology;  Laterality: N/A;   CORONARY STENT INTERVENTION N/A 02/24/2017   Procedure: Coronary Stent Intervention;  Surgeon: [name], MD;  Location: [place] [facility] LAB;  Service: Cardiovascular;  Laterality: N/A;   ESOPHAGOGASTRODUODENOSCOPY N/A 02/10/2021   Procedure: ESOPHAGOGASTRODUODENOSCOPY (EGD);  Surgeon: [name], MD;  Location: [facility] ENDOSCOPY;  Service: Gastroenterology;  Laterality: N/A;   ESOPHAGOGASTRODUODENOSCOPY (EGD) WITH PROPOFOL N/A 01/25/2021   Procedure: ESOPHAGOGASTRODUODENOSCOPY (EGD) WITH PROPOFOL;  Surgeon: [name], MD;  Location: [facility] ENDOSCOPY;  Service: Endoscopy;  Laterality: N/A;   GIVENS CAPSULE STUDY N/A 02/21/2021   Procedure: GIVENS CAPSULE STUDY;  Surgeon: [name], MD;  Location: [facility] ENDOSCOPY;  Service: Endoscopy;  Laterality: N/A;   LEFT HEART CATH AND CORONARY ANGIOGRAPHY N/A 02/24/2017   Procedure: Left Heart Cath and Coronary Angiography;  Surgeon: [name], MD;  Location: [facility] LAB;  Service: Cardiovascular;  Laterality: N/A;    FAMILY HISTORY: Family History  Problem Relation Age of Onset   Diabetes Mother     ADVANCED DIRECTIVES (Y/N):  N  HEALTH MAINTENANCE: Social History   Tobacco Use   Smoking status: Former   Smokeless tobacco: Never  Scientific laboratory technician Use: Never used  Substance Use Topics   Alcohol use: Yes  Alcohol/week: 6.0 standard drinks    Types: 6 Cans of beer per week    Comment: 1 or 2 with dinner   Drug use: No     Colonoscopy:  PAP:  Bone density:  Lipid panel:  Allergies  Allergen Reactions   Penicillins Other (See Comments)     Headache  Headache   Aspirin Other (See Comments)    02/05/2020-pt has been taking baby aspirin before bed and tolerating well.    Current Outpatient Medications  Medication Sig Dispense Refill   ASPIRIN 81 PO Take by mouth.     atorvastatin (LIPITOR) 40 MG tablet Take 1 tablet (40 mg total) by mouth daily at 6 PM. 90 tablet 1   losartan (COZAAR) 50 MG tablet Take 1 tablet (50 mg total) by mouth 2 (two) times daily. 60 tablet 2   Blood Glucose Monitoring Suppl (FREESTYLE LITE) w/Device KIT 1 each by Does not apply route daily. Use as directed (Patient not taking: Reported on 09/25/2021) 1 kit 0   glucose blood (FREESTYLE LITE) test strip Use as instructed as needed once a day DX: E11.65 (Patient not taking: Reported on 09/25/2021) 100 each 1   Lancets 30G MISC Use as directed once a day DX impaired fasting glucose (Patient not taking: Reported on 09/25/2021) 100 each 1   nitroGLYCERIN (NITROSTAT) 0.4 MG SL tablet Place 1 tablet (0.4 mg total) under the tongue every 5 (five) minutes as needed for chest pain. (Patient not taking: Reported on 09/25/2021) 30 tablet 0   tadalafil (CIALIS) 20 MG tablet 1 tab 1 hour prior to intercourse (Patient not taking: Reported on 09/25/2021) 60 tablet 3   No current facility-administered medications for this visit.    OBJECTIVE: Vitals:   09/25/21 1519  BP: (!) 157/67  Pulse: 63  Resp: 18  SpO2: 99%     Body mass index is 29.42 kg/m.    ECOG FS:0 - Asymptomatic  General: Well-developed, well-nourished, no acute distress. Eyes: Pink conjunctiva, anicteric sclera. HEENT: Normocephalic, moist mucous membranes. Lungs: No audible wheezing or coughing. Heart: Regular rate and rhythm. Abdomen: Soft, nontender, no obvious distention. Musculoskeletal: No edema, cyanosis, or clubbing. Neuro: Alert, answering all questions appropriately. Cranial nerves grossly intact. Skin: No rashes or petechiae noted. Psych: Normal affect.  LAB RESULTS:  Lab Results   Component Value Date   NA 143 07/30/2021   K 4.3 07/30/2021   CL 104 07/30/2021   CO2 24 07/30/2021   GLUCOSE 117 (H) 07/30/2021   BUN 13 07/30/2021   CREATININE 0.99 07/30/2021   CALCIUM 9.6 07/30/2021   PROT 7.9 07/30/2021   ALBUMIN 4.9 (H) 07/30/2021   AST 22 07/30/2021   ALT 22 07/30/2021   ALKPHOS 141 (H) 07/30/2021   BILITOT 0.9 07/30/2021   GFRNONAA >60 02/13/2021   GFRAA 88 08/01/2020    Lab Results  Component Value Date   WBC 6.7 09/25/2021   NEUTROABS 4.1 09/25/2021   HGB 14.7 09/25/2021   HCT 43.7 09/25/2021   MCV 84.2 09/25/2021   PLT 257 09/25/2021   Lab Results  Component Value Date   IRON 85 09/25/2021   TIBC 370 09/25/2021   IRONPCTSAT 23 09/25/2021   Lab Results  Component Value Date   FERRITIN 46 09/25/2021     STUDIES: No results found.   ASSESSMENT: Iron deficiency anemia secondary to GI bleed.   PLAN:    1. Iron deficiency anemia: Resolved.  Patient underwent colonoscopy, EGD, and video endoscopy recently which only revealed  nonbleeding AVMs.  Bleed possibly secondary to underlying gastritis as a result of taking colchicine, prednisone, and aspirin for a gout flare.  Patient's hemoglobin and iron stores continue to be within normal limits.  He admits to not taking oral iron supplementation.  No intervention is needed at this time.  No further follow-up has been scheduled.  Please refer patient back if there are any questions or concerns.   2.  History of GI bleed: Continue follow-up with gastroenterology as scheduled.  I spent a total of 20 minutes reviewing chart data, face-to-face evaluation with the patient, counseling and coordination of care as detailed above.    Patient expressed understanding and was in agreement with this plan. He also understands that He can call clinic at any time with any questions, concerns, or complaints.    Lloyd Huger, MD   09/26/2021 12:13 PM

## 2021-09-25 ENCOUNTER — Inpatient Hospital Stay: Payer: Medicare Other | Attending: Oncology

## 2021-09-25 ENCOUNTER — Other Ambulatory Visit: Payer: Self-pay

## 2021-09-25 ENCOUNTER — Inpatient Hospital Stay (HOSPITAL_BASED_OUTPATIENT_CLINIC_OR_DEPARTMENT_OTHER): Payer: Medicare Other | Admitting: Oncology

## 2021-09-25 VITALS — BP 157/67 | HR 63 | Resp 18 | Wt 208.0 lb

## 2021-09-25 DIAGNOSIS — Z79899 Other long term (current) drug therapy: Secondary | ICD-10-CM | POA: Diagnosis not present

## 2021-09-25 DIAGNOSIS — Z87891 Personal history of nicotine dependence: Secondary | ICD-10-CM | POA: Diagnosis not present

## 2021-09-25 DIAGNOSIS — Z886 Allergy status to analgesic agent status: Secondary | ICD-10-CM | POA: Diagnosis not present

## 2021-09-25 DIAGNOSIS — Z880 Allergy status to penicillin: Secondary | ICD-10-CM | POA: Insufficient documentation

## 2021-09-25 DIAGNOSIS — Z833 Family history of diabetes mellitus: Secondary | ICD-10-CM | POA: Insufficient documentation

## 2021-09-25 DIAGNOSIS — D509 Iron deficiency anemia, unspecified: Secondary | ICD-10-CM

## 2021-09-25 DIAGNOSIS — Z6829 Body mass index (BMI) 29.0-29.9, adult: Secondary | ICD-10-CM | POA: Diagnosis not present

## 2021-09-25 DIAGNOSIS — I252 Old myocardial infarction: Secondary | ICD-10-CM | POA: Diagnosis not present

## 2021-09-25 DIAGNOSIS — R635 Abnormal weight gain: Secondary | ICD-10-CM | POA: Diagnosis not present

## 2021-09-25 DIAGNOSIS — D500 Iron deficiency anemia secondary to blood loss (chronic): Secondary | ICD-10-CM | POA: Diagnosis present

## 2021-09-25 DIAGNOSIS — E119 Type 2 diabetes mellitus without complications: Secondary | ICD-10-CM | POA: Insufficient documentation

## 2021-09-25 DIAGNOSIS — K922 Gastrointestinal hemorrhage, unspecified: Secondary | ICD-10-CM | POA: Diagnosis not present

## 2021-09-25 LAB — CBC WITH DIFFERENTIAL/PLATELET
Abs Immature Granulocytes: 0.01 10*3/uL (ref 0.00–0.07)
Basophils Absolute: 0 10*3/uL (ref 0.0–0.1)
Basophils Relative: 1 %
Eosinophils Absolute: 0.2 10*3/uL (ref 0.0–0.5)
Eosinophils Relative: 3 %
HCT: 43.7 % (ref 39.0–52.0)
Hemoglobin: 14.7 g/dL (ref 13.0–17.0)
Immature Granulocytes: 0 %
Lymphocytes Relative: 26 %
Lymphs Abs: 1.8 10*3/uL (ref 0.7–4.0)
MCH: 28.3 pg (ref 26.0–34.0)
MCHC: 33.6 g/dL (ref 30.0–36.0)
MCV: 84.2 fL (ref 80.0–100.0)
Monocytes Absolute: 0.6 10*3/uL (ref 0.1–1.0)
Monocytes Relative: 10 %
Neutro Abs: 4.1 10*3/uL (ref 1.7–7.7)
Neutrophils Relative %: 60 %
Platelets: 257 10*3/uL (ref 150–400)
RBC: 5.19 MIL/uL (ref 4.22–5.81)
RDW: 12.3 % (ref 11.5–15.5)
WBC: 6.7 10*3/uL (ref 4.0–10.5)
nRBC: 0 % (ref 0.0–0.2)

## 2021-09-25 LAB — IRON AND TIBC
Iron: 85 ug/dL (ref 45–182)
Saturation Ratios: 23 % (ref 17.9–39.5)
TIBC: 370 ug/dL (ref 250–450)
UIBC: 285 ug/dL

## 2021-09-25 LAB — FERRITIN: Ferritin: 46 ng/mL (ref 24–336)

## 2021-09-26 ENCOUNTER — Other Ambulatory Visit: Payer: Self-pay | Admitting: Internal Medicine

## 2021-10-02 IMAGING — US US THYROID
1 series · 14 of 25 positions shown · non-contrast
Comparison: 03/03/2020, 04/03/2020

CLINICAL DATA: Thyroid nodule previous biopsy right inferior
thyroid nodule 04/03/2020

EXAM:
THYROID ULTRASOUND
TECHNIQUE: Ultrasound examination of the thyroid gland and adjacent soft
tissues was performed.

[Series 1: us thyroid · 0.08mm/px · 14 of 42 slices shown]
[im 1/42]
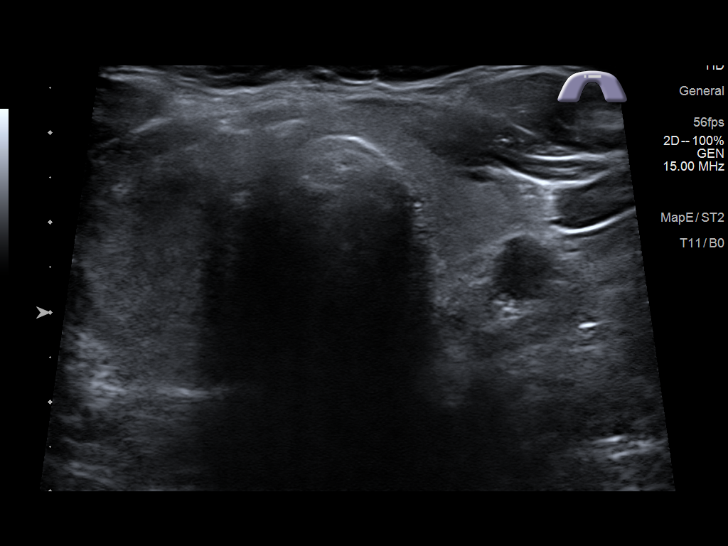
[im 4/42]
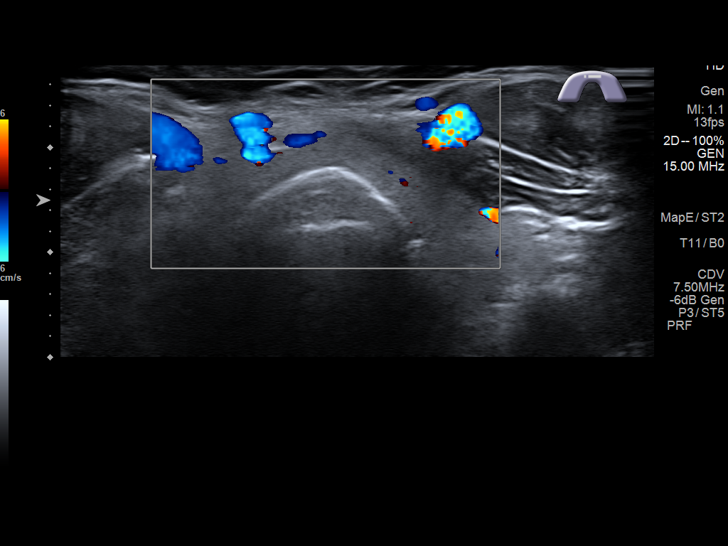
[im 7/42]
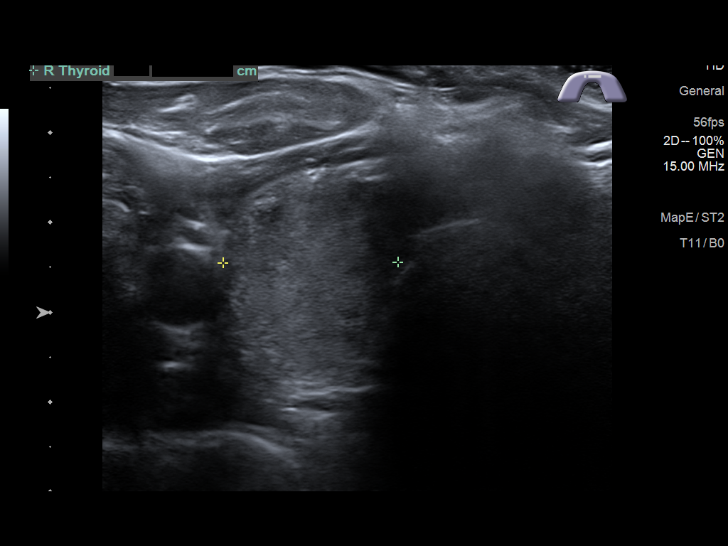
[im 11/42]
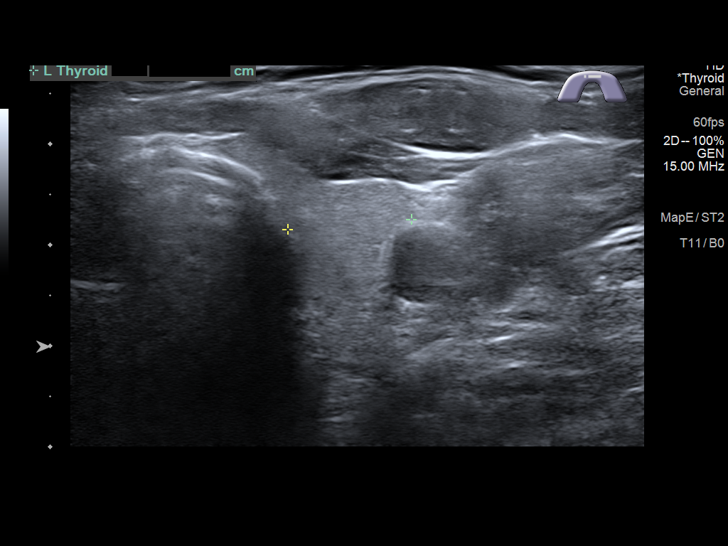
[im 14/42]
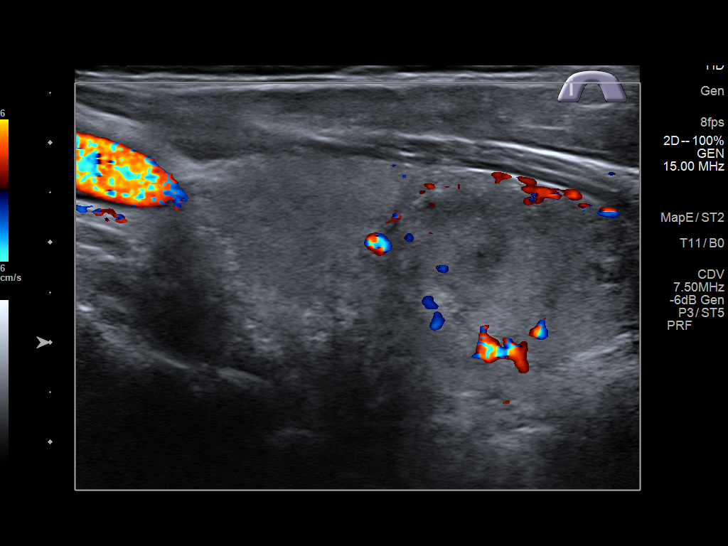
[im 16/42]
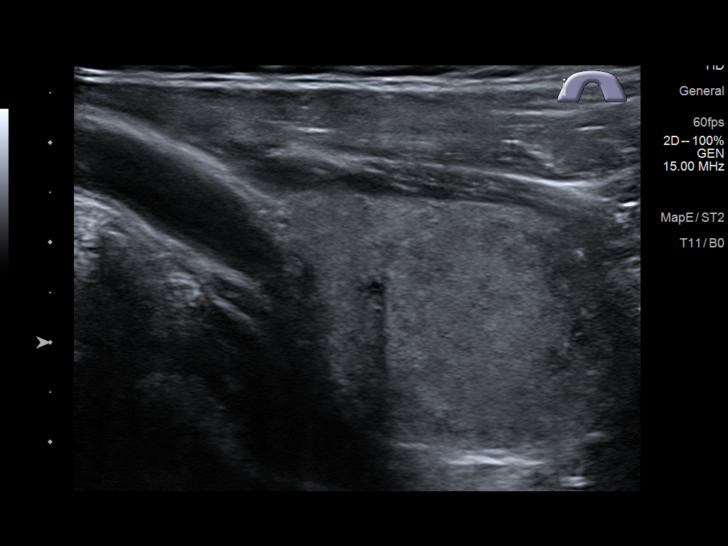
[im 19/42]
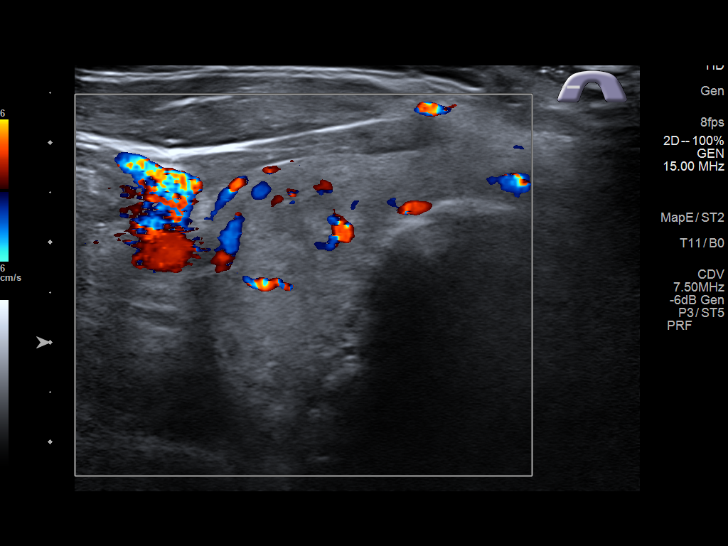
[im 23/42]
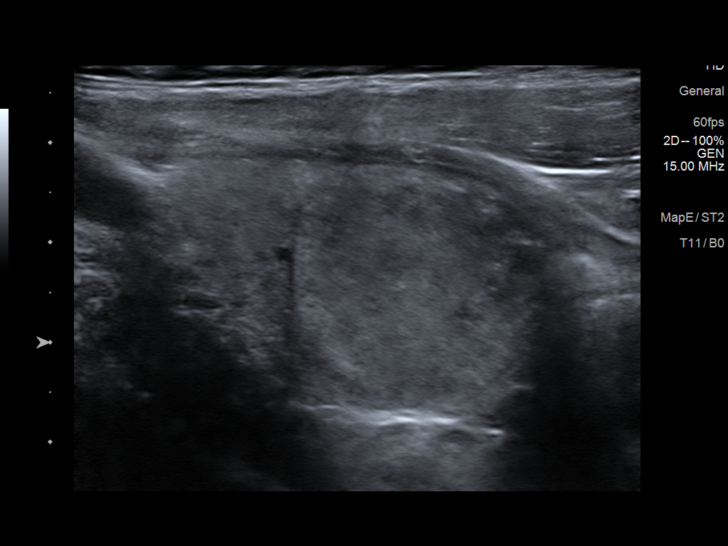
[im 26/42]
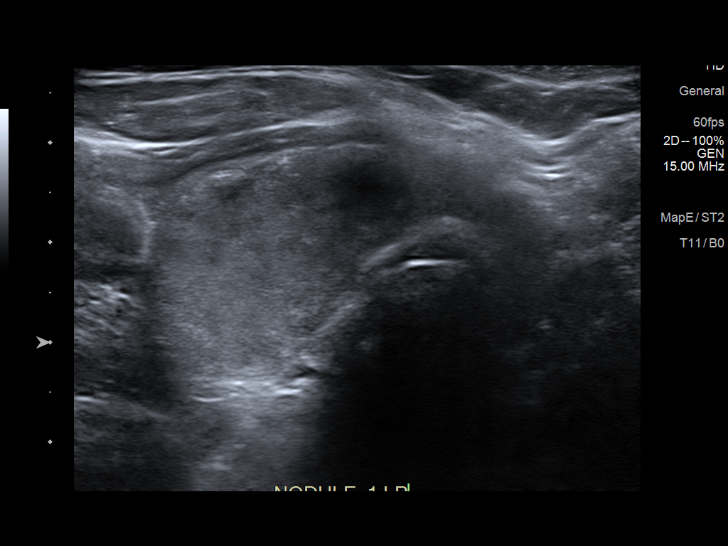
[im 28/42]
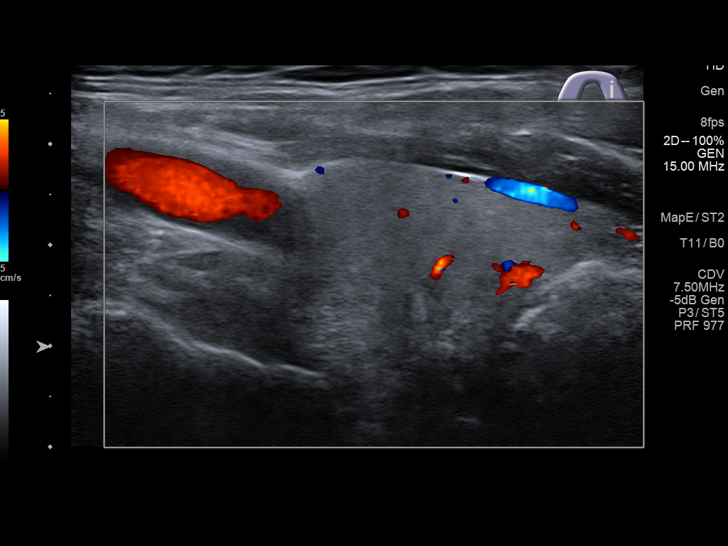
[im 31/42]
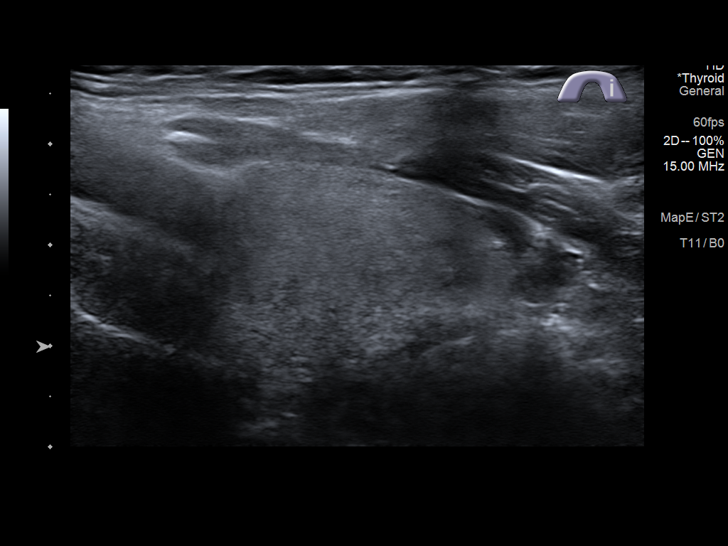
[im 35/42]
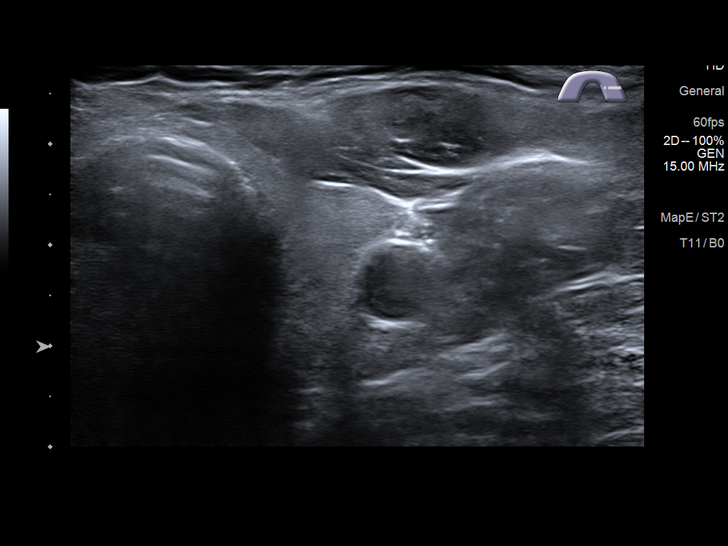
[im 38/42]
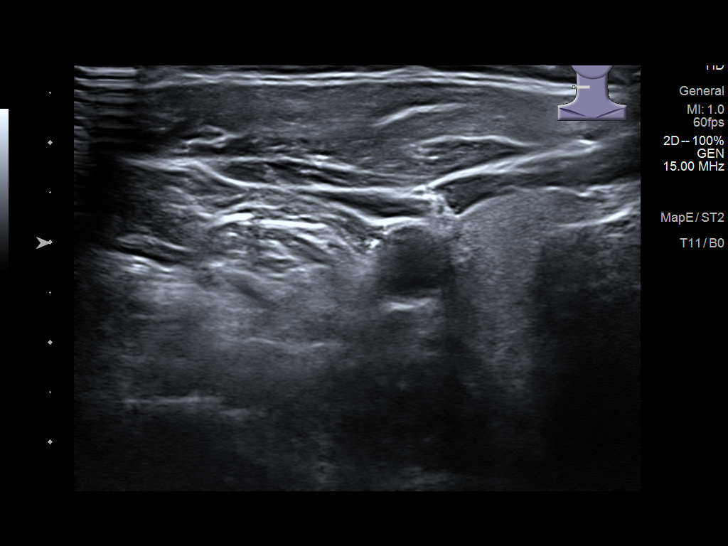
[im 42/42]
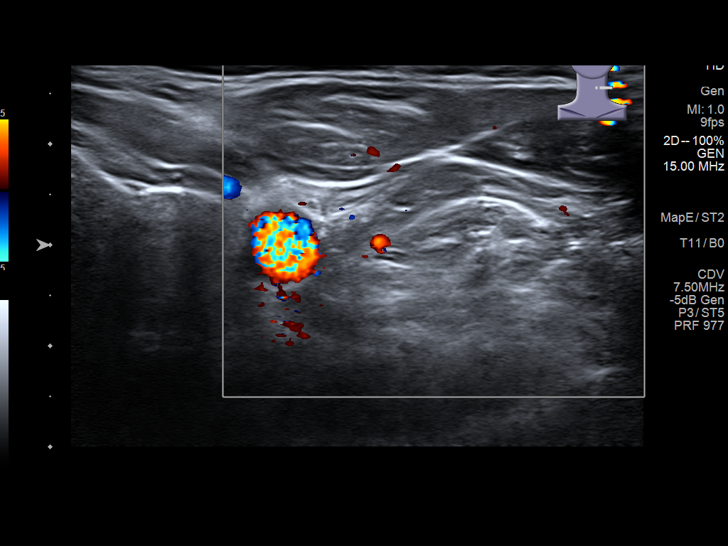

[14 of 25 positions shown; findings below may reference images not displayed]

FINDINGS: Parenchymal Echotexture: Normal

Isthmus: 4 mm

Right lobe: 4.7 x 2.7 x 2.0 cm

Left lobe: 2.9 x 2.1 x 1.2 cm

_________________________________________________________

Estimated total number of nodules >/= 1 cm: 1

Number of spongiform nodules >/=  2 cm not described below (TR1): 0

Number of mixed cystic and solid nodules >/= 1.5 cm not described
below (TR2): 0

_________________________________________________________

The previously biopsied right inferior solid isoechoic TR 3 type
nodule measures 2.6 x 2.6 x 2.1 cm, previously 2.8 x 2.8 x 2.4 cm.
Correlate with prior pathology.

No significant left thyroid abnormality. No new or enlarging thyroid
nodule. No hypervascularity or regional adenopathy.
IMPRESSION: 2.6 cm stable right inferior TR 3 type nodule, previously biopsied.

No new or enlarging thyroid abnormality.

The above is in keeping with the ACR TI-RADS recommendations - [HOSPITAL] 8841;[DATE].

## 2021-10-21 DIAGNOSIS — Z9842 Cataract extraction status, left eye: Secondary | ICD-10-CM

## 2021-10-21 DIAGNOSIS — Z9841 Cataract extraction status, right eye: Secondary | ICD-10-CM

## 2021-10-21 HISTORY — DX: Cataract extraction status, left eye: Z98.41

## 2021-10-21 HISTORY — PX: TRIGGER FINGER RELEASE: SHX641

## 2021-10-21 HISTORY — DX: Cataract extraction status, left eye: Z98.42

## 2021-11-05 ENCOUNTER — Other Ambulatory Visit: Payer: Self-pay | Admitting: Nurse Practitioner

## 2021-11-05 DIAGNOSIS — I10 Essential (primary) hypertension: Secondary | ICD-10-CM

## 2022-01-30 ENCOUNTER — Encounter: Payer: Self-pay | Admitting: Nurse Practitioner

## 2022-01-30 ENCOUNTER — Ambulatory Visit (INDEPENDENT_AMBULATORY_CARE_PROVIDER_SITE_OTHER): Payer: Medicare Other | Admitting: Nurse Practitioner

## 2022-01-30 VITALS — BP 135/71 | HR 64 | Temp 98.4°F | Resp 16 | Ht 71.0 in | Wt 207.0 lb

## 2022-01-30 DIAGNOSIS — I10 Essential (primary) hypertension: Secondary | ICD-10-CM

## 2022-01-30 DIAGNOSIS — E119 Type 2 diabetes mellitus without complications: Secondary | ICD-10-CM | POA: Diagnosis not present

## 2022-01-30 DIAGNOSIS — I700 Atherosclerosis of aorta: Secondary | ICD-10-CM

## 2022-01-30 DIAGNOSIS — Z23 Encounter for immunization: Secondary | ICD-10-CM

## 2022-01-30 LAB — POCT GLYCOSYLATED HEMOGLOBIN (HGB A1C): Hemoglobin A1C: 6.4 % — AB (ref 4.0–5.6)

## 2022-01-30 MED ORDER — TETANUS-DIPHTH-ACELL PERTUSSIS 5-2.5-18.5 LF-MCG/0.5 IM SUSP: 0.5000 mL | Freq: Once | INTRAMUSCULAR | 0 refills | Status: AC

## 2022-01-30 MED ORDER — ATORVASTATIN CALCIUM 40 MG PO TABS: 40.0000 mg | ORAL_TABLET | Freq: Every day | ORAL | 3 refills | Status: DC

## 2022-01-30 NOTE — Progress Notes (Signed)
[facility] ?[address] ?[place], [place] [address] ? ?Internal MEDICINE  ?Office Visit Note ? ?Patient Name: Mark Wyatt ? [id number]  ?[id number] ? ?Date of Service: 01/30/2022 ? ?Chief Complaint  ?Patient presents with  ? Follow-up  ? Hypertension  ? Hyperlipidemia  ? Diabetes  ? Hand Problem  ?  Had surgery on hand, but hand is still bothering him - pt said it could be from working in the yard  ? ? ?HPI ?Koven presents for follow-up visit for diabetes, hypertension and hyperlipidemia.  Patient reports that his blood pressure was 115/69 at home this morning and he has been doing better since switching to losartan.  Blood pressure is stable today at office visit as well.  His A1c is 6.4 today slightly elevated when compared to his A1c from August last year of 6.1.  He also needs refills of atorvastatin today.  He is also overdue for a tetanus booster vaccine.  He has no other questions or concerns today ? ? ? ? ?Current Medication: ?Outpatient Encounter Medications as of 01/30/2022  ?Medication Sig  ? ASPIRIN 81 PO Take by mouth.  ? Blood Glucose Monitoring Suppl (FREESTYLE LITE) w/Device KIT 1 each by Does not apply route daily. Use as directed  ? glucose blood (FREESTYLE LITE) test strip Use as instructed as needed once a day DX: E11.65  ? Lancets 30G MISC Use as directed once a day DX impaired fasting glucose  ? losartan (COZAAR) 50 MG tablet TAKE 1 TABLET(50 MG) BY MOUTH TWICE DAILY  ? nitroGLYCERIN (NITROSTAT) 0.4 MG SL tablet Place 1 tablet (0.4 mg total) under the tongue every 5 (five) minutes as needed for chest pain.  ? tadalafil (CIALIS) 20 MG tablet 1 tab 1 hour prior to intercourse  ? Tdap (BOOSTRIX) 5-2.5-18.5 LF-MCG/0.5 injection Inject 0.5 mLs into the muscle once for 1 dose.  ? [DISCONTINUED] atorvastatin (LIPITOR) 40 MG tablet Take 1 tablet (40 mg total) by mouth daily at 6 PM.  ? atorvastatin (LIPITOR) 40 MG tablet Take 1 tablet (40 mg total) by mouth daily at 6 PM.  ? [DISCONTINUED]  metoprolol tartrate (LOPRESSOR) 25 MG tablet Take 1 tablet (25 mg total) by mouth 2 (two) times daily.  ? ?No facility-administered encounter medications on file as of 01/30/2022.  ? ? ?Surgical History: ?Past Surgical History:  ?Procedure Laterality Date  ? ANAL FISTULOTOMY    ? CARDIAC CATHETERIZATION    ? COLONOSCOPY WITH PROPOFOL N/A 02/13/2021  ? Procedure: COLONOSCOPY WITH PROPOFOL;  Surgeon: [name], MD;  Location: [facility] ENDOSCOPY;  Service: Gastroenterology;  Laterality: N/A;  ? CORONARY STENT INTERVENTION N/A 02/24/2017  ? Procedure: Coronary Stent Intervention;  Surgeon: [name], MD;  Location: [facility] CV LAB;  Service: Cardiovascular;  Laterality: N/A;  ? ESOPHAGOGASTRODUODENOSCOPY N/A 02/10/2021  ? Procedure: ESOPHAGOGASTRODUODENOSCOPY (EGD);  Surgeon: [name], MD;  Location: [facility] ENDOSCOPY;  Service: Gastroenterology;  Laterality: N/A;  ? ESOPHAGOGASTRODUODENOSCOPY (EGD) WITH PROPOFOL N/A 01/25/2021  ? Procedure: ESOPHAGOGASTRODUODENOSCOPY (EGD) WITH PROPOFOL;  Surgeon: [name], MD;  Location: [facility] ENDOSCOPY;  Service: Endoscopy;  Laterality: N/A;  ? Excel STUDY N/A 02/21/2021  ? Procedure: GIVENS CAPSULE STUDY;  Surgeon: [name], MD;  Location: [facility] ENDOSCOPY;  Service: Endoscopy;  Laterality: N/A;  ? LEFT HEART CATH AND CORONARY ANGIOGRAPHY N/A 02/24/2017  ? Procedure: Left Heart Cath and Coronary Angiography;  Surgeon: [name], MD;  Location: [facility] LAB;  Service: Cardiovascular;  Laterality: N/A;  ? ? ?  Medical History: ?Past Medical History:  ?Diagnosis Date  ? Diabetes mellitus without complication ([place])   ? Gout   ? Hyperlipidemia   ? Hypertension   ? Myocardial infarction [facility])   ? mild  ? Overactive bladder   ? [place] [facility]  ? Trigger finger of all digits of right hand   ? ? ?Family History: ?Family History  ?Problem Relation Age of Onset  ? Diabetes Mother   ? ? ?Social History  ? ?Socioeconomic History  ?  Marital status: Married  ?  Spouse name: Not on file  ? Number of children: Not on file  ? Years of education: Not on file  ? Highest education level: Not on file  ?Occupational History  ? Not on file  ?Tobacco Use  ? Smoking status: Former  ? Smokeless tobacco: Never  ?Vaping Use  ? Vaping Use: Never used  ?Substance and Sexual Activity  ? Alcohol use: Yes  ?  Alcohol/week: 6.0 standard drinks  ?  Types: 6 Cans of beer per week  ?  Comment: 1 or 2 with dinner  ? Drug use: No  ? Sexual activity: Not on file  ?Other Topics Concern  ? Not on file  ?Social History Narrative  ? Not on file  ? ?Social Determinants of Health  ? ?Financial Resource Strain: Not on file  ?Food Insecurity: Not on file  ?Transportation Needs: Not on file  ?Physical Activity: Not on file  ?Stress: Not on file  ?Social Connections: Not on file  ?Intimate Partner Violence: Not on file  ? ? ? ? ?Review of Systems  ?Constitutional:  Negative for chills, fatigue and unexpected weight change.  ?HENT:  Negative for congestion, rhinorrhea, sneezing and sore throat.   ?Eyes:  Negative for redness.  ?Respiratory:  Negative for cough, chest tightness and shortness of breath.   ?Cardiovascular:  Negative for chest pain and palpitations.  ?Gastrointestinal:  Negative for abdominal pain, constipation, diarrhea, nausea and vomiting.  ?Genitourinary:  Negative for dysuria and frequency.  ?Musculoskeletal:  Positive for arthralgias. Negative for back pain, joint swelling and neck pain.  ?     Right hand is bothering him, had surgery earlier this year.   ?Skin:  Negative for rash.  ?Neurological: Negative.  Negative for tremors and numbness.  ?Hematological:  Negative for adenopathy. Does not bruise/bleed easily.  ?Psychiatric/Behavioral:  Negative for behavioral problems (Depression), sleep disturbance and suicidal ideas. The patient is not nervous/anxious.   ? ?Vital Signs: ?BP 135/71   Pulse 64   Temp 98.4 ?F (36.9 ?C)   Resp 16   Ht '5\' 11"'  (1.803 m)    Wt 207 lb (93.9 kg)   SpO2 90%   BMI 28.87 kg/m?  ? ? ?Physical Exam ?Vitals reviewed.  ?Constitutional:   ?   General: He is not in acute distress. ?   Appearance: Normal appearance. He is not ill-appearing.  ?HENT:  ?   Head: Normocephalic and atraumatic.  ?Eyes:  ?   Pupils: Pupils are equal, round, and reactive to light.  ?Cardiovascular:  ?   Rate and Rhythm: Normal rate and regular rhythm.  ?Pulmonary:  ?   Effort: Pulmonary effort is normal. No respiratory distress.  ?Neurological:  ?   Mental Status: He is alert and oriented to person, place, and time.  ?Psychiatric:     ?   Mood and Affect: Mood normal.     ?   Behavior: Behavior normal.  ? ? ? ? ? ?  Assessment/Plan: ?1. Type 2 diabetes mellitus without complication, without long-term current use of insulin ([place]) ?A1c is stable at 6.4, continue medications as prescribed, no changes.  Follow-up in 3 months for repeat A1c.  If his A1c is worse in 3 months, will adjust medications accordingly ?- POCT HgB A1C ? ?2. Essential hypertension ?Blood pressure stable on current medications, no changes at this time.  Sees cardiology and is meeting his new cardiologist today as his previous one is retiring. ? ?3. Aortic atherosclerosis ([place]) ?Continue atorvastatin as prescribed, refills ordered. ?- atorvastatin (LIPITOR) 40 MG tablet; Take 1 tablet (40 mg total) by mouth daily at 6 PM.  Dispense: 90 tablet; Refill: 3 ? ?4. Need for vaccination ?- Tdap ([place]) 5-2.5-18.5 LF-MCG/0.5 injection; Inject 0.5 mLs into the muscle once for 1 dose.  Dispense: 0.5 mL; Refill: 0 ? ? ?General Counseling: Karel verbalizes understanding of the findings of todays visit and agrees with plan of treatment. I have discussed any further diagnostic evaluation that may be needed or ordered today. We also reviewed his medications today. he has been encouraged to call the office with any questions or concerns that should arise related to todays visit. ? ? ? ?Orders Placed This Encounter   ?Procedures  ? POCT HgB A1C  ? ? ?Meds ordered this encounter  ?Medications  ? atorvastatin (LIPITOR) 40 MG tablet  ?  Sig: Take 1 tablet (40 mg total) by mouth daily at 6 PM.  ?  Dispense:  90 tablet  ?  Refill:  3

## 2022-02-16 ENCOUNTER — Other Ambulatory Visit: Payer: Self-pay | Admitting: Nurse Practitioner

## 2022-02-16 DIAGNOSIS — I10 Essential (primary) hypertension: Secondary | ICD-10-CM

## 2022-02-18 HISTORY — PX: CATARACT EXTRACTION: SUR2

## 2022-02-27 ENCOUNTER — Encounter: Payer: Medicare Other | Admitting: Dermatology

## 2022-03-15 ENCOUNTER — Telehealth: Payer: Self-pay

## 2022-03-15 NOTE — Telephone Encounter (Signed)
Received call from patient's wife @ 12:50 stating patient is having severe epigastric and back pain and needs to be seen today. I explained to her that we only see patients Friday mornings, and suggested she take him to either urgent care or [facility]

## 2022-03-21 HISTORY — PX: CATARACT EXTRACTION: SUR2

## 2022-04-11 ENCOUNTER — Ambulatory Visit
Admission: RE | Admit: 2022-04-11 | Discharge: 2022-04-11 | Disposition: A | Discharge status: Home / Self Care | Payer: Medicare Other | Source: Ambulatory Visit | Attending: Unknown Physician Specialty | Admitting: Unknown Physician Specialty

## 2022-04-11 DIAGNOSIS — E041 Nontoxic single thyroid nodule: Secondary | ICD-10-CM

## 2022-04-16 ENCOUNTER — Other Ambulatory Visit: Payer: Self-pay | Admitting: Unknown Physician Specialty

## 2022-04-16 DIAGNOSIS — E041 Nontoxic single thyroid nodule: Secondary | ICD-10-CM

## 2022-05-01 ENCOUNTER — Encounter: Payer: Self-pay | Admitting: Nurse Practitioner

## 2022-05-01 ENCOUNTER — Ambulatory Visit (INDEPENDENT_AMBULATORY_CARE_PROVIDER_SITE_OTHER): Payer: Medicare Other | Admitting: Nurse Practitioner

## 2022-05-01 VITALS — BP 140/70 | HR 60 | Temp 98.6°F | Resp 16 | Ht 71.0 in | Wt 211.0 lb

## 2022-05-01 DIAGNOSIS — E041 Nontoxic single thyroid nodule: Secondary | ICD-10-CM

## 2022-05-01 DIAGNOSIS — N528 Other male erectile dysfunction: Secondary | ICD-10-CM

## 2022-05-01 DIAGNOSIS — E559 Vitamin D deficiency, unspecified: Secondary | ICD-10-CM | POA: Diagnosis not present

## 2022-05-01 DIAGNOSIS — I10 Essential (primary) hypertension: Secondary | ICD-10-CM | POA: Diagnosis not present

## 2022-05-01 DIAGNOSIS — D500 Iron deficiency anemia secondary to blood loss (chronic): Secondary | ICD-10-CM

## 2022-05-01 DIAGNOSIS — E119 Type 2 diabetes mellitus without complications: Secondary | ICD-10-CM

## 2022-05-01 DIAGNOSIS — I700 Atherosclerosis of aorta: Secondary | ICD-10-CM | POA: Diagnosis not present

## 2022-05-01 LAB — POCT GLYCOSYLATED HEMOGLOBIN (HGB A1C): Hemoglobin A1C: 6.3 % — AB (ref 4.0–5.6)

## 2022-05-01 MED ORDER — NITROGLYCERIN 0.4 MG SL SUBL: 0.4000 mg | SUBLINGUAL_TABLET | SUBLINGUAL | 2 refills | Status: DC | PRN

## 2022-05-01 MED ORDER — TADALAFIL 20 MG PO TABS: ORAL_TABLET | ORAL | 3 refills | Status: DC

## 2022-05-01 MED ORDER — LOSARTAN POTASSIUM 50 MG PO TABS: 50.0000 mg | ORAL_TABLET | Freq: Two times a day (BID) | ORAL | 3 refills | Status: DC

## 2022-05-01 NOTE — Progress Notes (Signed)
[facility], [place] [address]  Internal MEDICINE  Office Visit Note  Patient Name: Mark Wyatt  [id number]  [id number]  Date of Service: 05/01/2022  Chief Complaint  Patient presents with   Follow-up   Hyperlipidemia   Diabetes    HPI Mark Wyatt presents for follow-up visit for diabetes, hypertension and hyperlipidemia.   Blood pressure is stable today at office visit.  His A1c is 6.3 today slightly improved when compared to his A1c from April this year of 6.4.  had bilateral cataract surgery and can see better now.  BP has been good at home except for today, states it was high at home this morning. Had a recent thyroid ultrasound that was stable with ENT.  Annual wellness visit coming up in October, will be due for labs.    Current Medication: Outpatient Encounter Medications as of 05/01/2022  Medication Sig   ASPIRIN 81 PO Take by mouth.   atorvastatin (LIPITOR) 40 MG tablet Take 1 tablet (40 mg total) by mouth daily at 6 PM.   Blood Glucose Monitoring Suppl (FREESTYLE LITE) w/Device KIT 1 each by Does not apply route daily. Use as directed   glucose blood (FREESTYLE LITE) test strip Use as instructed as needed once a day DX: E11.65   Lancets 30G MISC Use as directed once a day DX impaired fasting glucose   [DISCONTINUED] losartan (COZAAR) 50 MG tablet TAKE 1 TABLET(50 MG) BY MOUTH TWICE DAILY   [DISCONTINUED] nitroGLYCERIN (NITROSTAT) 0.4 MG SL tablet Place 1 tablet (0.4 mg total) under the tongue every 5 (five) minutes as needed for chest pain.   [DISCONTINUED] tadalafil (CIALIS) 20 MG tablet 1 tab 1 hour prior to intercourse   losartan (COZAAR) 50 MG tablet Take 1 tablet (50 mg total) by mouth 2 (two) times daily.   nitroGLYCERIN (NITROSTAT) 0.4 MG SL tablet Place 1 tablet (0.4 mg total) under the tongue every 5 (five) minutes as needed for chest pain.   tadalafil (CIALIS) 20 MG tablet 1 tab 1 hour prior to intercourse   [DISCONTINUED]  metoprolol tartrate (LOPRESSOR) 25 MG tablet Take 1 tablet (25 mg total) by mouth 2 (two) times daily.   No facility-administered encounter medications on file as of 05/01/2022.    Surgical History: Past Surgical History:  Procedure Laterality Date   ANAL FISTULOTOMY     CARDIAC CATHETERIZATION     CATARACT EXTRACTION Right 02/2022   CATARACT EXTRACTION Left 03/2022   COLONOSCOPY WITH PROPOFOL N/A 02/13/2021   Procedure: COLONOSCOPY WITH PROPOFOL;  Surgeon: [name], MD;  Location: [facility];  Service: Gastroenterology;  Laterality: N/A;   CORONARY STENT INTERVENTION N/A 02/24/2017   Procedure: Coronary Stent Intervention;  Surgeon: [name], MD;  Location: [facility] CV LAB;  Service: Cardiovascular;  Laterality: N/A;   ESOPHAGOGASTRODUODENOSCOPY N/A 02/10/2021   Procedure: ESOPHAGOGASTRODUODENOSCOPY (EGD);  Surgeon: [name], MD;  Location: [facility] ENDOSCOPY;  Service: Gastroenterology;  Laterality: N/A;   ESOPHAGOGASTRODUODENOSCOPY (EGD) WITH PROPOFOL N/A 01/25/2021   Procedure: ESOPHAGOGASTRODUODENOSCOPY (EGD) WITH PROPOFOL;  Surgeon: [name], MD;  Location: [facility] ENDOSCOPY;  Service: Endoscopy;  Laterality: N/A;   GIVENS CAPSULE STUDY N/A 02/21/2021   Procedure: GIVENS CAPSULE STUDY;  Surgeon: [name], MD;  Location: [facility] ENDOSCOPY;  Service: Endoscopy;  Laterality: N/A;   LEFT HEART CATH AND CORONARY ANGIOGRAPHY N/A 02/24/2017   Procedure: Left Heart Cath and Coronary Angiography;  Surgeon: [name], MD;  Location: [facility] CV LAB;  Service:  Cardiovascular;  Laterality: N/A;   TRIGGER FINGER RELEASE Right 10/2021    Medical History: Past Medical History:  Diagnosis Date   Diabetes mellitus without complication ([place])    Gout    Hyperlipidemia    Hypertension    Myocardial infarction ([place])    mild   Overactive bladder    [facility] [facility]   Trigger finger of all digits of right hand     Family  History: Family History  Problem Relation Age of Onset   Diabetes Mother     Social History   Socioeconomic History   Marital status: Married    Spouse name: Not on file   Number of children: Not on file   Years of education: Not on file   Highest education level: Not on file  Occupational History   Not on file  Tobacco Use   Smoking status: Former   Smokeless tobacco: Never  Vaping Use   Vaping Use: Never used  Substance and Sexual Activity   Alcohol use: Yes    Alcohol/week: 6.0 standard drinks of alcohol    Types: 6 Cans of beer per week    Comment: 1 or 2 with dinner   Drug use: No   Sexual activity: Not on file  Other Topics Concern   Not on file  Social History Narrative   Not on file   Social Determinants of Health   Financial Resource Strain: Not on file  Food Insecurity: Not on file  Transportation Needs: Not on file  Physical Activity: Not on file  Stress: Not on file  Social Connections: Not on file  Intimate Partner Violence: Not on file      Review of Systems  Constitutional:  Negative for chills, fatigue and unexpected weight change.  HENT:  Negative for congestion, rhinorrhea, sneezing and sore throat.   Eyes:  Negative for redness.  Respiratory: Negative.  Negative for cough, chest tightness, shortness of breath and wheezing.   Cardiovascular: Negative.  Negative for chest pain and palpitations.  Gastrointestinal:  Negative for abdominal pain, constipation, diarrhea, nausea and vomiting.  Genitourinary:  Negative for dysuria and frequency.  Musculoskeletal:  Negative for back pain, joint swelling and neck pain.       Right hand is bothering him, had surgery earlier this year.   Skin:  Negative for rash.  Neurological: Negative.  Negative for tremors and numbness.  Hematological:  Negative for adenopathy. Does not bruise/bleed easily.  Psychiatric/Behavioral:  Negative for behavioral problems (Depression), sleep disturbance and suicidal ideas.  The patient is not nervous/anxious.     Vital Signs: BP 140/70   Pulse 60   Temp 98.6 F (37 C)   Resp 16   Ht '5\' 11"'  (1.803 m)   Wt 211 lb (95.7 kg)   SpO2 97%   BMI 29.43 kg/m    Physical Exam Vitals reviewed.  Constitutional:      General: He is not in acute distress.    Appearance: Normal appearance. He is obese. He is not ill-appearing.  HENT:     Head: Normocephalic and atraumatic.  Eyes:     Pupils: Pupils are equal, round, and reactive to light.  Cardiovascular:     Rate and Rhythm: Normal rate and regular rhythm.  Pulmonary:     Effort: Pulmonary effort is normal. No respiratory distress.  Neurological:     Mental Status: He is alert and oriented to person, place, and time.  Psychiatric:        Mood  and Affect: Mood normal.        Behavior: Behavior normal.        Assessment/Plan: 1. Type 2 diabetes mellitus without complication, without long-term current use of insulin (H[facility]) A1c slightly improved to 6.3 today from 6.4 in April.  No changes in medications at this time.  Routine labs ordered - POCT HgB A1C - CMP14+EGFR - TSH + free T4  2. Essential hypertension Blood pressure stable with current medication, refills ordered - losartan (COZAAR) 50 MG tablet; Take 1 tablet (50 mg total) by mouth 2 (two) times daily.  Dispense: 180 tablet; Refill: 3  3. Aortic atherosclerosis ([place]) Routine labs ordered - Lipid Profile  4. Vitamin D deficiency Routine lab ordered - Vitamin D (25 hydroxy)  5. Iron deficiency anemia due to chronic blood loss Routine labs ordered - CBC with Differential/Platelet - Iron, TIBC and Ferritin Panel - B12 and Folate Panel  6. Thyroid nodule Patient thyroid ultrasound was stable, labs ordered - TSH + free T4  7. Other male erectile dysfunction Refills ordered - tadalafil (CIALIS) 20 MG tablet; 1 tab 1 hour prior to intercourse  Dispense: 60 tablet; Refill: 3   General Counseling: Mark Wyatt verbalizes understanding of  the findings of todays visit and agrees with plan of treatment. I have discussed any further diagnostic evaluation that may be needed or ordered today. We also reviewed his medications today. he has been encouraged to call the office with any questions or concerns that should arise related to todays visit.    Orders Placed This Encounter  Procedures   CBC with Differential/Platelet   Lipid Profile   Vitamin D (25 hydroxy)   CMP14+EGFR   TSH + free T4   Iron, TIBC and Ferritin Panel   B12 and Folate Panel   POCT HgB A1C    Meds ordered this encounter  Medications   losartan (COZAAR) 50 MG tablet    Sig: Take 1 tablet (50 mg total) by mouth 2 (two) times daily.    Dispense:  180 tablet    Refill:  3   tadalafil (CIALIS) 20 MG tablet    Sig: 1 tab 1 hour prior to intercourse    Dispense:  60 tablet    Refill:  3   nitroGLYCERIN (NITROSTAT) 0.4 MG SL tablet    Sig: Place 1 tablet (0.4 mg total) under the tongue every 5 (five) minutes as needed for chest pain.    Dispense:  30 tablet    Refill:  2    Patient will call if he needs this filled    Return for previously scheduled, CPE, [place] PCP in october.   Total time spent:30 Minutes Time spent includes review of chart, medications, test results, and follow up plan with the patient.   [place] Controlled Substance Database was reviewed by me.  This patient was seen by Jonetta Osgood, FNP-C in collaboration with Dr. Clayborn Bigness as a part of collaborative care agreement.   Mark Reddy R. Valetta Fuller, MSN, FNP-C Internal medicine

## 2022-05-07 ENCOUNTER — Telehealth: Payer: Self-pay

## 2022-05-07 NOTE — Telephone Encounter (Signed)
Completed PA for Tadalafil 20 MG tablets. [employer] approved 10 tabs for 23 days or 30 tabs for 68 days. If pt requires a larger quantity then provider may have to talk with insurance in the future. Approval number [id number] for April 07 2022 to October 20 2098. LMOM to inform pt.

## 2022-05-24 ENCOUNTER — Ambulatory Visit: Payer: Self-pay | Admitting: Urology

## 2022-08-03 LAB — CBC WITH DIFFERENTIAL/PLATELET
Basophils Absolute: 0.1 x10E3/uL (ref 0.0–0.2)
Basos: 1 %
EOS (ABSOLUTE): 0.2 x10E3/uL (ref 0.0–0.4)
Eos: 3 %
Hematocrit: 46.1 % (ref 37.5–51.0)
Hemoglobin: 15.3 g/dL (ref 13.0–17.7)
Immature Grans (Abs): 0 x10E3/uL (ref 0.0–0.1)
Immature Granulocytes: 0 %
Lymphocytes Absolute: 1.4 x10E3/uL (ref 0.7–3.1)
Lymphs: 26 %
MCH: 28.9 pg (ref 26.6–33.0)
MCHC: 33.2 g/dL (ref 31.5–35.7)
MCV: 87 fL (ref 79–97)
Monocytes Absolute: 0.6 x10E3/uL (ref 0.1–0.9)
Monocytes: 11 %
Neutrophils Absolute: 3.3 x10E3/uL (ref 1.4–7.0)
Neutrophils: 59 %
Platelets: 247 x10E3/uL (ref 150–450)
RBC: 5.3 x10E6/uL (ref 4.14–5.80)
RDW: 12.8 % (ref 11.6–15.4)
WBC: 5.5 x10E3/uL (ref 3.4–10.8)

## 2022-08-03 LAB — CMP14+EGFR
ALT: 24 IU/L (ref 0–44)
AST: 21 IU/L (ref 0–40)
Albumin/Globulin Ratio: 1.5 (ref 1.2–2.2)
Albumin: 4.4 g/dL (ref 3.8–4.8)
Alkaline Phosphatase: 119 IU/L (ref 44–121)
BUN/Creatinine Ratio: 19 (ref 10–24)
BUN: 21 mg/dL (ref 8–27)
Bilirubin Total: 1.1 mg/dL (ref 0.0–1.2)
CO2: 25 mmol/L (ref 20–29)
Calcium: 9.8 mg/dL (ref 8.6–10.2)
Chloride: 103 mmol/L (ref 96–106)
Creatinine, Ser: 1.11 mg/dL (ref 0.76–1.27)
Globulin, Total: 2.9 g/dL (ref 1.5–4.5)
Glucose: 125 mg/dL — ABNORMAL HIGH (ref 70–99)
Potassium: 4.4 mmol/L (ref 3.5–5.2)
Sodium: 139 mmol/L (ref 134–144)
Total Protein: 7.3 g/dL (ref 6.0–8.5)
eGFR: 71 mL/min/{1.73_m2} (ref >59)

## 2022-08-03 LAB — IRON,TIBC AND FERRITIN PANEL
Ferritin: 175 ng/mL (ref 30–400)
Iron Saturation: 27 % (ref 15–55)
Iron: 80 ug/dL (ref 38–169)
Total Iron Binding Capacity: 301 ug/dL (ref 250–450)
UIBC: 221 ug/dL (ref 111–343)

## 2022-08-03 LAB — LIPID PANEL
Chol/HDL Ratio: 3 ratio (ref 0.0–5.0)
Cholesterol, Total: 101 mg/dL (ref 100–199)
HDL: 34 mg/dL — ABNORMAL LOW (ref >39)
LDL Chol Calc (NIH): 52 mg/dL (ref 0–99)
Triglycerides: 71 mg/dL (ref 0–149)
VLDL Cholesterol Cal: 15 mg/dL (ref 5–40)

## 2022-08-03 LAB — TSH+FREE T4
Free T4: 1.11 ng/dL (ref 0.82–1.77)
TSH: 2.02 u[IU]/mL (ref 0.450–4.500)

## 2022-08-03 LAB — B12 AND FOLATE PANEL
Folate: 10.2 ng/mL (ref >3.0)
Vitamin B-12: 309 pg/mL (ref 232–1245)

## 2022-08-03 LAB — VITAMIN D 25 HYDROXY (VIT D DEFICIENCY, FRACTURES): Vit D, 25-Hydroxy: 36 ng/mL (ref 30.0–100.0)

## 2022-08-05 ENCOUNTER — Ambulatory Visit: Payer: Medicare Other | Admitting: Nurse Practitioner

## 2022-09-10 ENCOUNTER — Encounter: Payer: Self-pay | Admitting: Nurse Practitioner

## 2022-09-10 ENCOUNTER — Ambulatory Visit (INDEPENDENT_AMBULATORY_CARE_PROVIDER_SITE_OTHER): Payer: Medicare Other | Admitting: Nurse Practitioner

## 2022-09-10 VITALS — BP 154/79 | HR 74 | Temp 98.2°F | Resp 16 | Ht 71.0 in | Wt 210.0 lb

## 2022-09-10 DIAGNOSIS — E119 Type 2 diabetes mellitus without complications: Secondary | ICD-10-CM | POA: Diagnosis not present

## 2022-09-10 DIAGNOSIS — G8929 Other chronic pain: Secondary | ICD-10-CM

## 2022-09-10 DIAGNOSIS — I700 Atherosclerosis of aorta: Secondary | ICD-10-CM

## 2022-09-10 DIAGNOSIS — E1165 Type 2 diabetes mellitus with hyperglycemia: Secondary | ICD-10-CM | POA: Diagnosis not present

## 2022-09-10 DIAGNOSIS — E538 Deficiency of other specified B group vitamins: Secondary | ICD-10-CM | POA: Diagnosis not present

## 2022-09-10 DIAGNOSIS — Z0001 Encounter for general adult medical examination with abnormal findings: Secondary | ICD-10-CM

## 2022-09-10 DIAGNOSIS — N5201 Erectile dysfunction due to arterial insufficiency: Secondary | ICD-10-CM | POA: Diagnosis not present

## 2022-09-10 DIAGNOSIS — M5442 Lumbago with sciatica, left side: Secondary | ICD-10-CM

## 2022-09-10 DIAGNOSIS — M5441 Lumbago with sciatica, right side: Secondary | ICD-10-CM

## 2022-09-10 LAB — POCT GLYCOSYLATED HEMOGLOBIN (HGB A1C): Hemoglobin A1C: 6.4 % — AB (ref 4.0–5.6)

## 2022-09-10 MED ORDER — CYANOCOBALAMIN 1000 MCG/ML IJ SOLN
1000.0000 ug | Freq: Once | INTRAMUSCULAR | Status: AC
  Administered 2022-09-10: 1000 ug via INTRAMUSCULAR

## 2022-09-10 MED ORDER — CYANOCOBALAMIN 1000 MCG/ML IJ SOLN: 1000.0000 ug | Freq: Once | INTRAMUSCULAR | Status: DC

## 2022-09-10 MED ORDER — ATORVASTATIN CALCIUM 40 MG PO TABS: 40.0000 mg | ORAL_TABLET | Freq: Every day | ORAL | 3 refills | Status: DC

## 2022-09-10 MED ORDER — TADALAFIL 2.5 MG PO TABS: 2.5000 mg | ORAL_TABLET | Freq: Every day | ORAL | 1 refills | Status: DC

## 2022-09-10 MED ORDER — CYCLOBENZAPRINE HCL 10 MG PO TABS: 10.0000 mg | ORAL_TABLET | Freq: Every day | ORAL | 2 refills | Status: DC

## 2022-09-10 NOTE — Progress Notes (Signed)
[facility] [place], [place] [address]  Internal MEDICINE  Office Visit Note  Patient Name: Mark Wyatt  [id number]  [id number]  Date of Service: 09/10/2022  Chief Complaint  Patient presents with   Medicare Wellness   Hypertension   Hyperlipidemia   Diabetes    HPI Mark Wyatt presents for an annual well visit and physical exam. Accompanied by his wife at today's visit. Well-appearing 71 y.o. male with hypertension, CAD, GERD, diabetes, and high cholesterol. He has a history of prostate cancer, NSTEMI, GI bleed and anemia.  -Routine CRC screening: done April 2022 -Diabetic eye exam: done January 2023 -Diabetic foot exam: done at today's visit.  -Labs were done recently, grossly normal. Cholesterol is normal except for slightly low HDL. Borderline low B12 at 309.  -A1c 6.4 today --working on diet and start walking more per patient -will be going to [place] to have hearing evaluated so he can get hearing aids and have his back looked at again which is bothering him more now.  New or worsening pain: back pain  Other concerns: hearing     Current Medication: Outpatient Encounter Medications as of 09/10/2022  Medication Sig   ASPIRIN 81 PO Take by mouth.   Blood Glucose Monitoring Suppl (FREESTYLE LITE) w/Device KIT 1 each by Does not apply route daily. Use as directed   cyclobenzaprine (FLEXERIL) 10 MG tablet Take 1 tablet (10 mg total) by mouth at bedtime.   glucose blood (FREESTYLE LITE) test strip Use as instructed as needed once a day DX: E11.65   Lancets 30G MISC Use as directed once a day DX impaired fasting glucose   losartan (COZAAR) 50 MG tablet Take 1 tablet (50 mg total) by mouth 2 (two) times daily.   nitroGLYCERIN (NITROSTAT) 0.4 MG SL tablet Place 1 tablet (0.4 mg total) under the tongue every 5 (five) minutes as needed for chest pain.   Tadalafil 2.5 MG TABS Take 1 tablet (2.5 mg total) by mouth daily.   [DISCONTINUED] atorvastatin  (LIPITOR) 40 MG tablet Take 1 tablet (40 mg total) by mouth daily at 6 PM.   [DISCONTINUED] tadalafil (CIALIS) 20 MG tablet 1 tab 1 hour prior to intercourse   atorvastatin (LIPITOR) 40 MG tablet Take 1 tablet (40 mg total) by mouth daily at 6 PM.   [DISCONTINUED] metoprolol tartrate (LOPRESSOR) 25 MG tablet Take 1 tablet (25 mg total) by mouth 2 (two) times daily.   Facility-Administered Encounter Medications as of 09/10/2022  Medication   cyanocobalamin (VITAMIN B12) injection 1,000 mcg    Surgical History: Past Surgical History:  Procedure Laterality Date   ANAL FISTULOTOMY     CARDIAC CATHETERIZATION     CATARACT EXTRACTION Right 02/2022   CATARACT EXTRACTION Left 03/2022   COLONOSCOPY WITH PROPOFOL N/A 02/13/2021   Procedure: COLONOSCOPY WITH PROPOFOL;  Surgeon: [name], MD;  Location: [facility];  Service: Gastroenterology;  Laterality: N/A;   CORONARY STENT INTERVENTION N/A 02/24/2017   Procedure: Coronary Stent Intervention;  Surgeon: [name], MD;  Location: [place] [facility];  Service: Cardiovascular;  Laterality: N/A;   ESOPHAGOGASTRODUODENOSCOPY N/A 02/10/2021   Procedure: ESOPHAGOGASTRODUODENOSCOPY (EGD);  Surgeon: [name], MD;  Location: [facility] ENDOSCOPY;  Service: Gastroenterology;  Laterality: N/A;   ESOPHAGOGASTRODUODENOSCOPY (EGD) WITH PROPOFOL N/A 01/25/2021   Procedure: ESOPHAGOGASTRODUODENOSCOPY (EGD) WITH PROPOFOL;  Surgeon: [name], MD;  Location: [facility] ENDOSCOPY;  Service: Endoscopy;  Laterality: N/A;   GIVENS CAPSULE STUDY N/A 02/21/2021   Procedure: GIVENS  CAPSULE STUDY;  Surgeon: [name], MD;  Location: [facility] ENDOSCOPY;  Service: Endoscopy;  Laterality: N/A;   LEFT HEART CATH AND CORONARY ANGIOGRAPHY N/A 02/24/2017   Procedure: Left Heart Cath and Coronary Angiography;  Surgeon: [name], MD;  Location: [facility];  Service: Cardiovascular;  Laterality: N/A;   TRIGGER FINGER RELEASE Right  10/2021    Medical History: Past Medical History:  Diagnosis Date   Diabetes mellitus without complication ([place])    Gout    Hyperlipidemia    Hypertension    Myocardial infarction ([place])    mild   Overactive bladder    [place] [facility]   Trigger finger of all digits of right hand     Family History: Family History  Problem Relation Age of Onset   Diabetes Mother     Social History   Socioeconomic History   Marital status: Married    Spouse name: Not on file   Number of children: Not on file   Years of education: Not on file   Highest education level: Not on file  Occupational History   Not on file  Tobacco Use   Smoking status: Former   Smokeless tobacco: Never  Vaping Use   Vaping Use: Never used  Substance and Sexual Activity   Alcohol use: Yes    Alcohol/week: 6.0 standard drinks of alcohol    Types: 6 Cans of beer per week    Comment: 1 or 2 with dinner   Drug use: No   Sexual activity: Not on file  Other Topics Concern   Not on file  Social History Narrative   Not on file   Social Determinants of Health   Financial Resource Strain: Not on file  Food Insecurity: Not on file  Transportation Needs: Not on file  Physical Activity: Not on file  Stress: Not on file  Social Connections: Not on file  Intimate Partner Violence: Not on file      Review of Systems  Constitutional:  Negative for activity change, appetite change, chills, fatigue, fever and unexpected weight change.  HENT: Negative.  Negative for congestion, ear pain, rhinorrhea, sore throat and trouble swallowing.   Eyes: Negative.   Respiratory: Negative.  Negative for cough, chest tightness, shortness of breath and wheezing.   Cardiovascular: Negative.  Negative for chest pain and palpitations.  Gastrointestinal: Negative.  Negative for abdominal pain, blood in stool, constipation, diarrhea, nausea and vomiting.  Endocrine: Negative.   Genitourinary: Negative.  Negative for  difficulty urinating, dysuria, frequency, hematuria and urgency.  Musculoskeletal:  Positive for arthralgias (trigger finger of 4th finger of right hand (surgically repaired) but 4th finger of left hand is bothering him now). Negative for back pain, joint swelling, myalgias and neck pain.  Skin: Negative.  Negative for rash and wound.  Allergic/Immunologic: Negative.  Negative for immunocompromised state.  Neurological: Negative.  Negative for dizziness, seizures, numbness and headaches.  Hematological: Negative.   Psychiatric/Behavioral: Negative.  Negative for behavioral problems, self-injury and suicidal ideas. The patient is not nervous/anxious.     Vital Signs: BP (!) 154/79   Pulse 74   Temp 98.2 F (36.8 C)   Resp 16   Ht _0  (1.803 m)   Wt 210 lb (95.3 kg)   SpO2 99%   BMI 29.29 kg/m    Physical Exam Vitals reviewed.  Constitutional:      General: He is awake. He is not in acute distress.    Appearance: Normal appearance.  He is well-developed, well-groomed and overweight. He is not ill-appearing or diaphoretic.  HENT:     Head: Normocephalic and atraumatic.     Right Ear: Tympanic membrane, ear canal and external ear normal.     Left Ear: Tympanic membrane, ear canal and external ear normal.     Nose: Nose normal. No congestion or rhinorrhea.     Mouth/Throat:     Lips: Pink.     Mouth: Mucous membranes are moist.     Pharynx: Oropharynx is clear. Uvula midline. No oropharyngeal exudate or posterior oropharyngeal erythema.  Eyes:     General: Lids are normal. Vision grossly intact. Gaze aligned appropriately. No scleral icterus.       Right eye: No discharge.        Left eye: No discharge.     Extraocular Movements: Extraocular movements intact.     Conjunctiva/sclera: Conjunctivae normal.     Pupils: Pupils are equal, round, and reactive to light.     Funduscopic exam:    Right eye: Red reflex present.        Left eye: Red reflex present. Neck:     Thyroid:  No thyromegaly.     Vascular: No JVD.     Trachea: Trachea and phonation normal. No tracheal deviation.  Cardiovascular:     Rate and Rhythm: Normal rate and regular rhythm.     Pulses:          Carotid pulses are 3+ on the right side and 3+ on the left side.      Radial pulses are 2+ on the right side and 2+ on the left side.       Dorsalis pedis pulses are 2+ on the right side and 2+ on the left side.       Posterior tibial pulses are 2+ on the right side and 2+ on the left side.     Heart sounds: Normal heart sounds, S1 normal and S2 normal. No murmur heard.    No friction rub. No gallop.  Pulmonary:     Effort: Pulmonary effort is normal. No accessory muscle usage or respiratory distress.     Breath sounds: Normal breath sounds and air entry. No stridor. No wheezing or rales.  Chest:     Chest wall: No tenderness.  Abdominal:     General: Bowel sounds are normal. There is no distension.     Palpations: Abdomen is soft. There is no shifting dullness, fluid wave, mass or pulsatile mass.     Tenderness: There is no abdominal tenderness. There is no guarding or rebound.  Musculoskeletal:        General: No tenderness.     Right hand: Decreased range of motion (4th digit distal and medial interphalangeal joints locking up and causing pain when in flexion -- improved after surgery and physical therapy).     Left hand: Decreased range of motion (4th digit distal and medial interphalangeal joints locking up and causing pain when in flexion).     Cervical back: Normal range of motion and neck supple.     Right lower leg: No edema.     Left lower leg: No edema.     Right foot: Normal range of motion. No deformity, bunion, Charcot foot, foot drop or prominent metatarsal heads.     Left foot: Normal range of motion. No deformity, bunion, Charcot foot, foot drop or prominent metatarsal heads.  Feet:     Right foot:     Protective  Sensation: 6 sites tested.  6 sites sensed.     Skin integrity:  Dry skin present. No ulcer, blister, skin breakdown, erythema, warmth, callus or fissure.     Toenail Condition: Right toenails are abnormally thick and long.     Left foot:     Protective Sensation: 6 sites tested.  6 sites sensed.     Skin integrity: Dry skin present. No ulcer, blister, skin breakdown, erythema, warmth, callus or fissure.     Toenail Condition: Left toenails are abnormally thick and long.  Lymphadenopathy:     Cervical: No cervical adenopathy.  Skin:    General: Skin is warm and dry.     Capillary Refill: Capillary refill takes less than 2 seconds.     Coloration: Skin is not pale.     Findings: No erythema or rash.  Neurological:     Mental Status: He is alert and oriented to person, place, and time.     Cranial Nerves: No cranial nerve deficit.     Motor: No abnormal muscle tone.     Coordination: Coordination normal.     Gait: Gait normal.     Deep Tendon Reflexes: Reflexes are normal and symmetric.  Psychiatric:        Mood and Affect: Mood and affect normal.        Behavior: Behavior normal. Behavior is cooperative.        Thought Content: Thought content normal.        Judgment: Judgment normal.    Diabetic Foot Exam - Simple   Simple Foot Form Diabetic Foot exam was performed with the following findings: Yes 09/10/2022  3:00 PM  Visual Inspection Sensation Testing Pulse Check Comments        Assessment/Plan: 1. Encounter for general adult medical examination with abnormal findings Age-appropriate preventive screenings and vaccinations discussed, annual physical exam completed. Routine labs for health maintenance drawn previously, results discussed today. PHM updated.   2. Type 2 diabetes mellitus with hyperglycemia, without long-term current use of insulin (HCC) A1c slightly higher but only by 0.1 at 6.4 today. Agreed with patient on not starting any medications yet. Strongly encouraged diet and lifestyle modifications as discussed. Repeat A1C in  3 months, if no improvement, consider starting medication.  - POCT glycosylated hemoglobin (Hb A1C)  3. Erectile dysfunction due to arterial insufficiency Higher dose of prn tadalafil was causing a lot of acid reflux, switched to a small daily dose, will see if this clears up the acid reflux issue, patient will call if there are any issues.  - Tadalafil 2.5 MG TABS; Take 1 tablet (2.5 mg total) by mouth daily.  Dispense: 30 tablet; Refill: 1  4. Chronic midline low back pain with bilateral sciatica Take cyclobenzaprine prn at bedtime as prescribed.  - cyclobenzaprine (FLEXERIL) 10 MG tablet; Take 1 tablet (10 mg total) by mouth at bedtime.  Dispense: 30 tablet; Refill: 2  5. Aortic atherosclerosis (HCC) Continue atorvastatin as prescribed --- consider switching to rosuvastatin due to ED - atorvastatin (LIPITOR) 40 MG tablet; Take 1 tablet (40 mg total) by mouth daily at 6 PM.  Dispense: 90 tablet; Refill: 3  6. B12 deficiency Administered in office today - cyanocobalamin (VITAMIN B12) injection 1,000 mcg      General Counseling: Davidson verbalizes understanding of the findings of todays visit and agrees with plan of treatment. I have discussed any further diagnostic evaluation that may be needed or ordered today. We also reviewed his medications today. he has  been encouraged to call the office with any questions or concerns that should arise related to todays visit.    Orders Placed This Encounter  Procedures   POCT glycosylated hemoglobin (Hb A1C)    Meds ordered this encounter  Medications   cyclobenzaprine (FLEXERIL) 10 MG tablet    Sig: Take 1 tablet (10 mg total) by mouth at bedtime.    Dispense:  30 tablet    Refill:  2   atorvastatin (LIPITOR) 40 MG tablet    Sig: Take 1 tablet (40 mg total) by mouth daily at 6 PM.    Dispense:  90 tablet    Refill:  3   Tadalafil 2.5 MG TABS    Sig: Take 1 tablet (2.5 mg total) by mouth daily.    Dispense:  30 tablet    Refill:  1     Discontinue 20 mg tablet, start new med tomorrow fill today   cyanocobalamin (VITAMIN B12) injection 1,000 mcg    Return in about 3 months (around 12/11/2022) for F/U, Recheck A1C, Estefani Bateson PCP.   Total time spent:30 Minutes Time spent includes review of chart, medications, test results, and follow up plan with the patient.   [place] Controlled Substance Database was reviewed by me.  This patient was seen by Jonetta Osgood, FNP-C in collaboration with Dr. Clayborn Bigness as a part of collaborative care agreement.  Filbert Craze R. Valetta Fuller, MSN, FNP-C Internal medicine

## 2022-09-11 ENCOUNTER — Encounter: Payer: Self-pay | Admitting: Nurse Practitioner

## 2022-09-18 ENCOUNTER — Other Ambulatory Visit: Payer: Self-pay

## 2022-09-18 NOTE — Telephone Encounter (Signed)
Sent patient's P.A. for Tadalafil.

## 2022-09-20 ENCOUNTER — Telehealth: Payer: Self-pay

## 2022-09-20 NOTE — Telephone Encounter (Addendum)
Patient's Tadalafil was approved.

## 2022-10-07 IMAGING — US US THYROID
1 series · 14 of 25 positions shown · non-contrast
Comparison: 04/06/2021

04/03/2020

CLINICAL DATA: Thyroid nodule

EXAM:
THYROID ULTRASOUND
TECHNIQUE: Ultrasound examination of the thyroid gland and adjacent soft
tissues was performed.

[Series 1: us thyroid · 0.07mm/px · 14 of 33 slices shown]
[im 1/33]
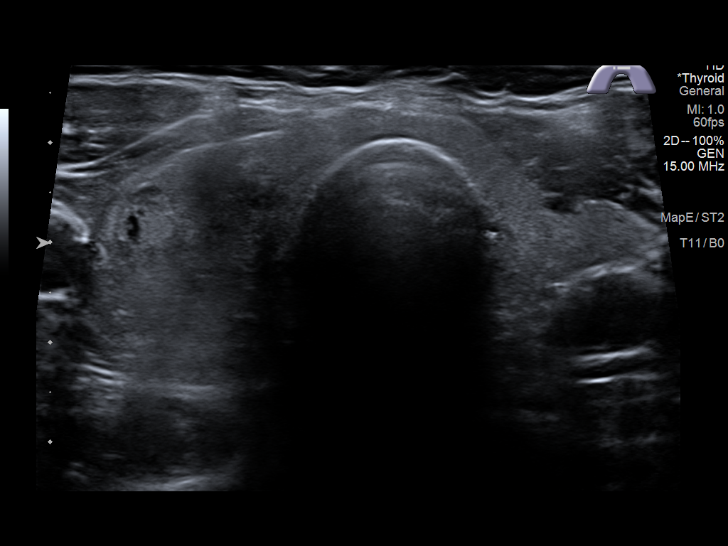
[im 3/33]
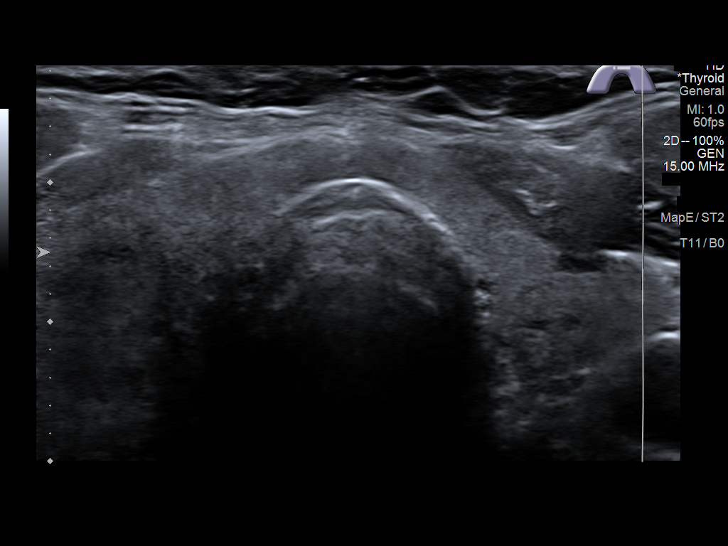
[im 6/33]
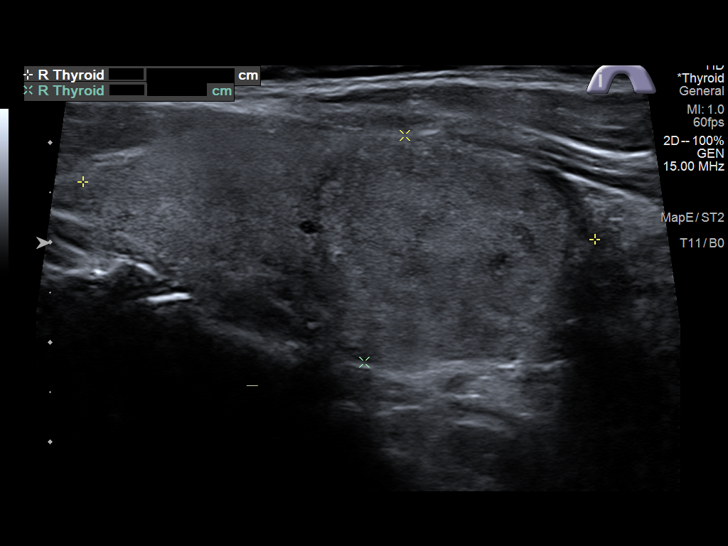
[im 9/33]
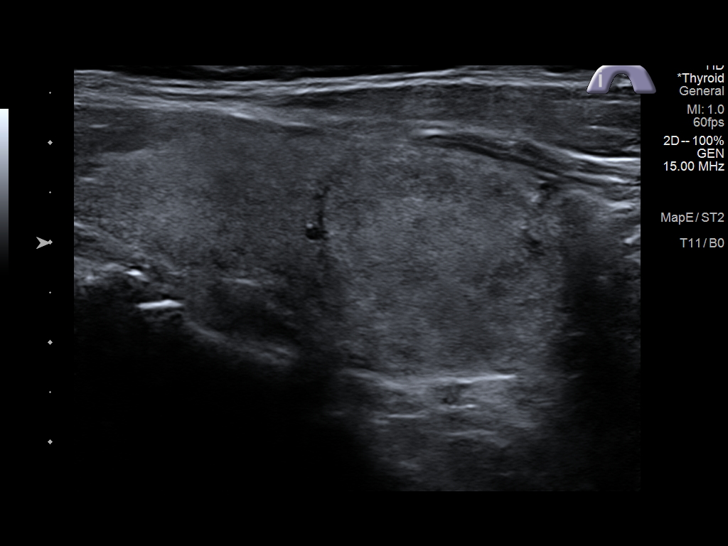
[im 11/33]
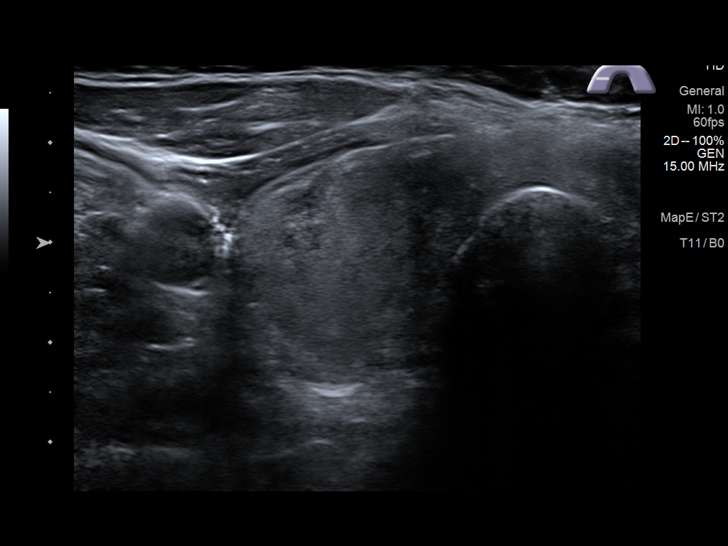
[im 13/33]
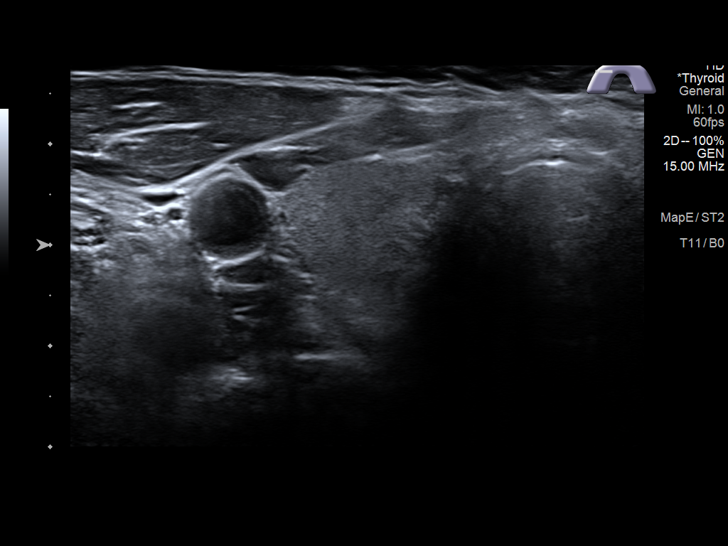
[im 15/33]
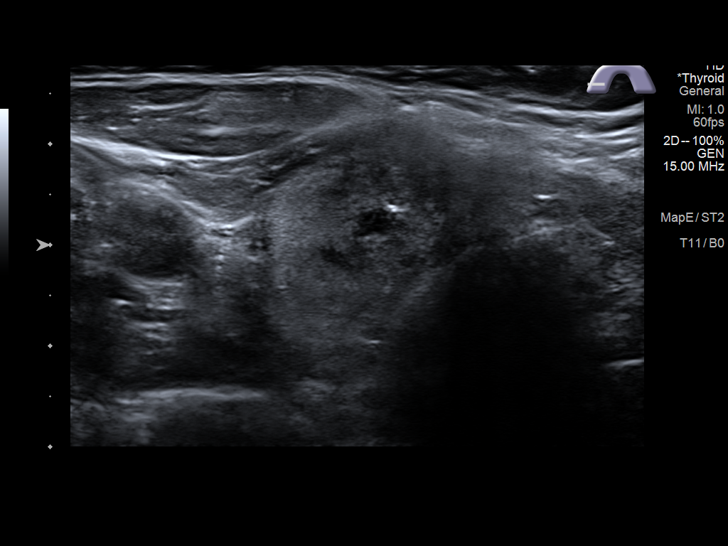
[im 18/33]
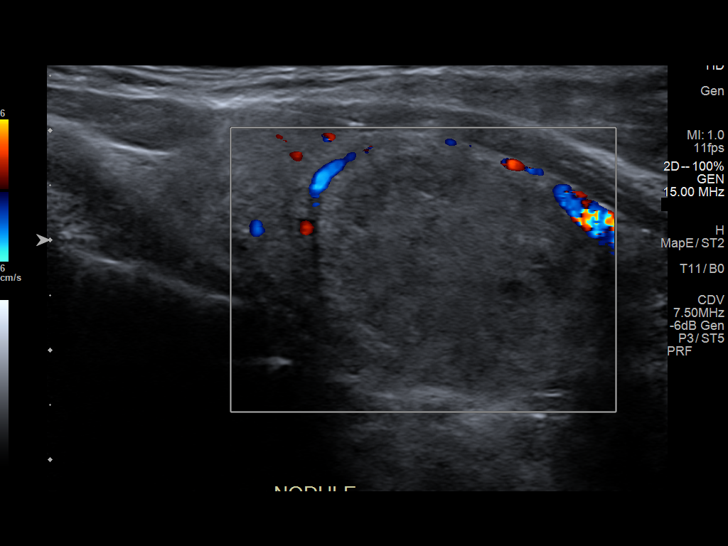
[im 21/33]
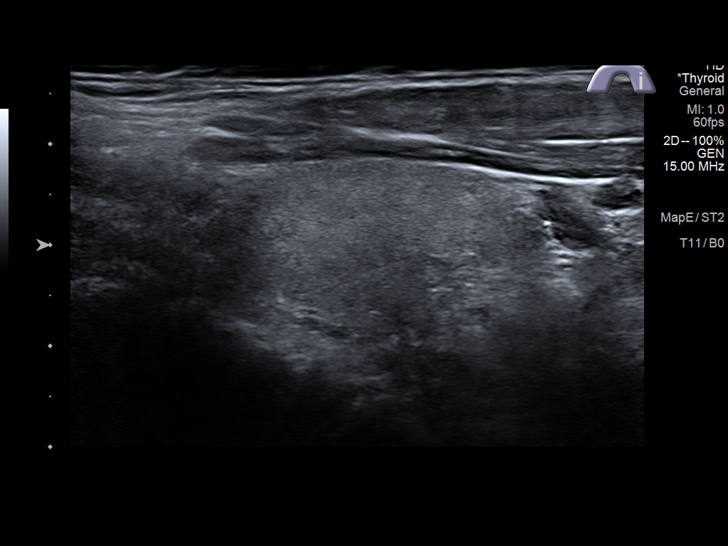
[im 22/33]
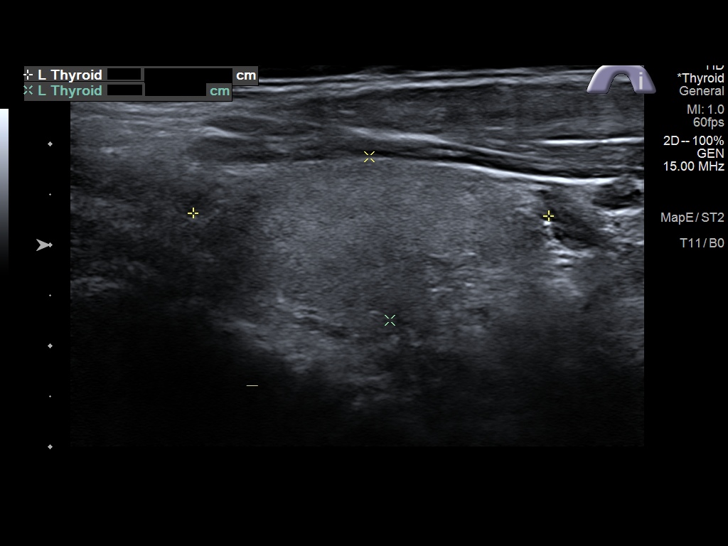
[im 25/33]
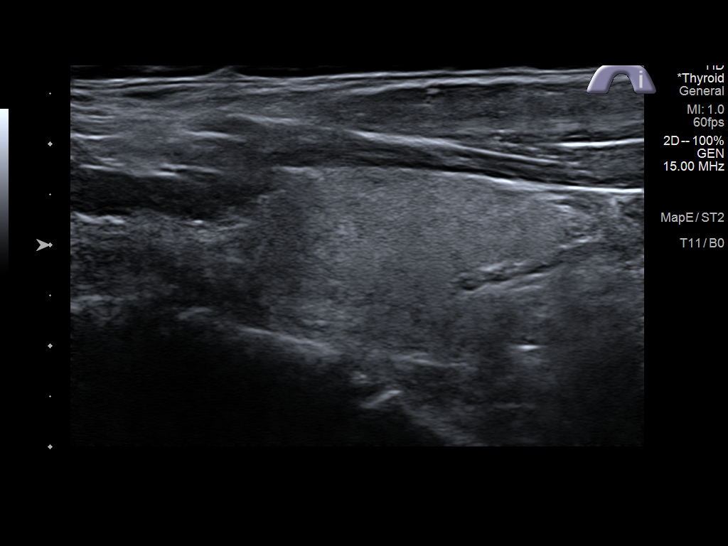
[im 27/33]
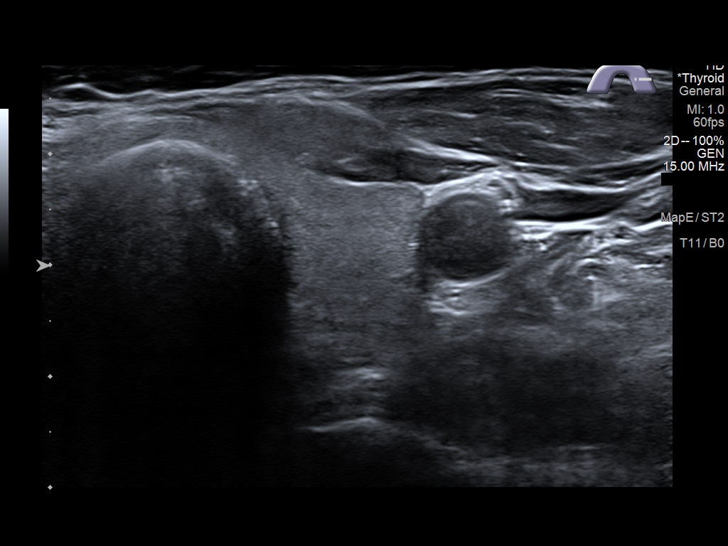
[im 30/33]
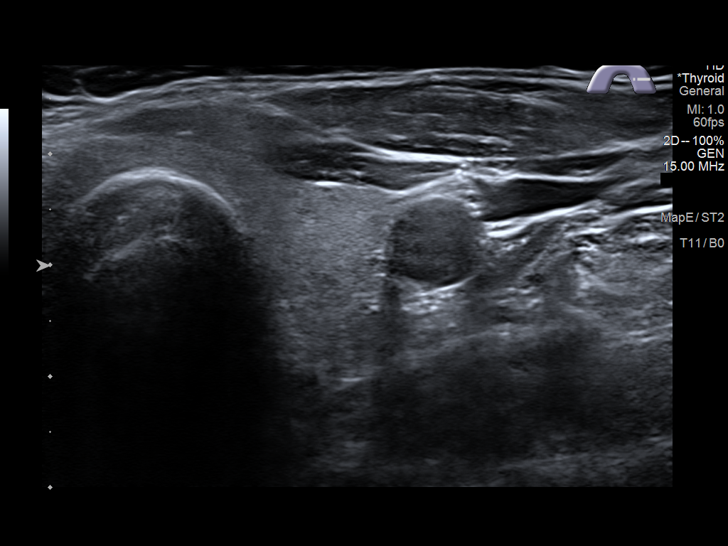
[im 33/33]
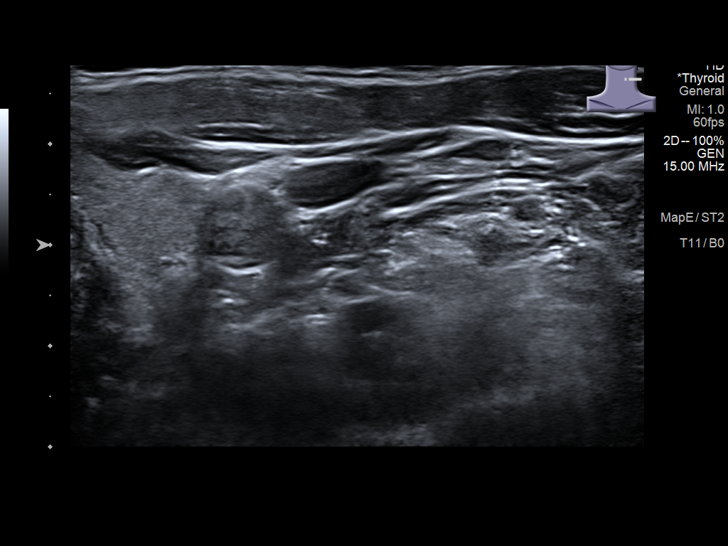

[14 of 25 positions shown; findings below may reference images not displayed]

FINDINGS: Parenchymal Echotexture: Mildly heterogenous

Isthmus: 0.4 cm

Right lobe: 5.2 x 2.3 x 2.4 cm

Left lobe: 3.5 x 1.6 x 1.2 cm

_________________________________________________________

Estimated total number of nodules >/= 1 cm: 1

Number of spongiform nodules >/=  2 cm not described below (TR1): 0

Number of mixed cystic and solid nodules >/= 1.5 cm not described
below (TR2): 0

_________________________________________________________

Previously biopsied right mid thyroid nodules not significantly
changed in size measuring 2.6 x 2.4 x 2.1 cm on the current exam
compared to 2.6 x 2.5 x 2.1 cm on the prior exam. Please correlate
with prior FNA results from 04/03/2020.
IMPRESSION: Unchanged right mid thyroid nodule. Please correlate with prior FNA
results from 04/03/2020.

The above is in keeping with the ACR TI-RADS recommendations - [HOSPITAL] 7082;[DATE].

## 2022-10-28 ENCOUNTER — Encounter: Payer: Self-pay | Admitting: Dermatology

## 2022-10-28 ENCOUNTER — Ambulatory Visit (INDEPENDENT_AMBULATORY_CARE_PROVIDER_SITE_OTHER): Payer: Medicare Other | Admitting: Dermatology

## 2022-10-28 DIAGNOSIS — L814 Other melanin hyperpigmentation: Secondary | ICD-10-CM

## 2022-10-28 DIAGNOSIS — L821 Other seborrheic keratosis: Secondary | ICD-10-CM

## 2022-10-28 DIAGNOSIS — L820 Inflamed seborrheic keratosis: Secondary | ICD-10-CM | POA: Diagnosis not present

## 2022-10-28 DIAGNOSIS — L853 Xerosis cutis: Secondary | ICD-10-CM

## 2022-10-28 DIAGNOSIS — Z1283 Encounter for screening for malignant neoplasm of skin: Secondary | ICD-10-CM | POA: Diagnosis not present

## 2022-10-28 DIAGNOSIS — L578 Other skin changes due to chronic exposure to nonionizing radiation: Secondary | ICD-10-CM | POA: Diagnosis not present

## 2022-10-28 DIAGNOSIS — D229 Melanocytic nevi, unspecified: Secondary | ICD-10-CM | POA: Diagnosis not present

## 2022-10-28 DIAGNOSIS — L570 Actinic keratosis: Secondary | ICD-10-CM | POA: Diagnosis not present

## 2022-10-28 NOTE — Progress Notes (Signed)
Follow-Up Visit   Subjective  Mark Wyatt is a 72 y.o. male who presents for the following: Annual Exam (1 year tbse. Patient reports some spots at face he would like checked. ). The patient presents for Total-Body Skin Exam (TBSE) for skin cancer screening and mole check.  The patient has spots, moles and lesions to be evaluated, some may be new or changing and the patient has concerns that these could be cancer.  The following portions of the chart were reviewed this encounter and updated as appropriate:  Tobacco  Allergies  Meds  Problems  Med Hx  Surg Hx  Fam Hx     Review of Systems: No other skin or systemic complaints except as noted in HPI or Assessment and Plan.  Objective  Well appearing patient in no apparent distress; mood and affect are within normal limits.  A full examination was performed including scalp, head, eyes, ears, nose, lips, neck, chest, axillae, abdomen, back, buttocks, bilateral upper extremities, bilateral lower extremities, hands, feet, fingers, toes, fingernails, and toenails. All findings within normal limits unless otherwise noted below.  Nose  x 2 (2) Erythematous thin papules/macules with gritty scale.   face x 7 (7) Erythematous stuck-on, waxy papule or plaque   Assessment & Plan  Actinic keratosis (2) Nose  x 2 Actinic keratoses are precancerous spots that appear secondary to cumulative UV radiation exposure/sun exposure over time. They are chronic with expected duration over 1 year. A portion of actinic keratoses will progress to squamous cell carcinoma of the skin. It is not possible to reliably predict which spots will progress to skin cancer and so treatment is recommended to prevent development of skin cancer.  Recommend daily broad spectrum sunscreen SPF 30+ to sun-exposed areas, reapply every 2 hours as needed.  Recommend staying in the shade or wearing long sleeves, sun glasses (UVA+UVB protection) and wide brim hats (4-inch brim  around the entire circumference of the hat). Call for new or changing lesions.  Destruction of lesion - Nose  x 2 Complexity: simple   Destruction method: cryotherapy   Informed consent: discussed and consent obtained   Timeout:  patient name, date of birth, surgical site, and procedure verified Lesion destroyed using liquid nitrogen: Yes   Region frozen until ice ball extended beyond lesion: Yes   Outcome: patient tolerated procedure well with no complications   Post-procedure details: wound care instructions given   Additional details:  Prior to procedure, discussed risks of blister formation, small wound, skin dyspigmentation, or rare scar following cryotherapy. Recommend Vaseline ointment to treated areas while healing.  Inflamed seborrheic keratosis (7) face x 7 Symptomatic, irritating, patient would like treated. Destruction of lesion - face x 7 Complexity: simple   Destruction method: cryotherapy   Informed consent: discussed and consent obtained   Timeout:  patient name, date of birth, surgical site, and procedure verified Lesion destroyed using liquid nitrogen: Yes   Region frozen until ice ball extended beyond lesion: Yes   Outcome: patient tolerated procedure well with no complications   Post-procedure details: wound care instructions given   Additional details:  Prior to procedure, discussed risks of blister formation, small wound, skin dyspigmentation, or rare scar following cryotherapy. Recommend Vaseline ointment to treated areas while healing.  Lentigines - Scattered tan macules - Due to sun exposure - Benign-appearing, observe - Recommend daily broad spectrum sunscreen SPF 30+ to sun-exposed areas, reapply every 2 hours as needed. - Call for any changes  Seborrheic Keratoses - Stuck-on,  waxy, tan-brown papules and/or plaques  - Benign-appearing - Discussed benign etiology and prognosis. - Observe - Call for any changes  Melanocytic Nevi - Tan-brown and/or  pink-flesh-colored symmetric macules and papules - Benign appearing on exam today - Observation - Call clinic for new or changing moles - Recommend daily use of broad spectrum spf 30+ sunscreen to sun-exposed areas.   Xerosis - diffuse xerotic patches - recommend gentle, hydrating skin care - gentle skin care handout given  Hemangiomas - Red papules - Discussed benign nature - Observe - Call for any changes  Actinic Damage - Chronic condition, secondary to cumulative UV/sun exposure - diffuse scaly erythematous macules with underlying dyspigmentation - Recommend daily broad spectrum sunscreen SPF 30+ to sun-exposed areas, reapply every 2 hours as needed.  - Staying in the shade or wearing long sleeves, sun glasses (UVA+UVB protection) and wide brim hats (4-inch brim around the entire circumference of the hat) are also recommended for sun protection.  - Call for new or changing lesions.  Skin cancer screening performed today. Return in about 1 year (around 10/29/2023) for TBSE. IRuthell Rummage, CMA, am acting as scribe for Sarina Ser, MD. Documentation: I have reviewed the above documentation for accuracy and completeness, and I agree with the above.  Sarina Ser, MD

## 2022-10-28 NOTE — Patient Instructions (Addendum)
Actinic keratoses are precancerous spots that appear secondary to cumulative UV radiation exposure/sun exposure over time. They are chronic with expected duration over 1 year. A portion of actinic keratoses will progress to squamous cell carcinoma of the skin. It is not possible to reliably predict which spots will progress to skin cancer and so treatment is recommended to prevent development of skin cancer.  Recommend daily broad spectrum sunscreen SPF 30+ to sun-exposed areas, reapply every 2 hours as needed.  Recommend staying in the shade or wearing long sleeves, sun glasses (UVA+UVB protection) and wide brim hats (4-inch brim around the entire circumference of the hat). Call for new or changing lesions.    Cryotherapy Aftercare  Wash gently with soap and water everyday.   Apply Vaseline and Band-Aid daily until healed.     Seborrheic Keratosis  What causes seborrheic keratoses? Seborrheic keratoses are harmless, common skin growths that first appear during adult life.  As time goes by, more growths appear.  Some people may develop a large number of them.  Seborrheic keratoses appear on both covered and uncovered body parts.  They are not caused by sunlight.  The tendency to develop seborrheic keratoses can be inherited.  They vary in color from skin-colored to gray, brown, or even black.  They can be either smooth or have a rough, warty surface.   Seborrheic keratoses are superficial and look as if they were stuck on the skin.  Under the microscope this type of keratosis looks like layers upon layers of skin.  That is why at times the top layer may seem to fall off, but the rest of the growth remains and re-grows.    Treatment Seborrheic keratoses do not need to be treated, but can easily be removed in the office.  Seborrheic keratoses often cause symptoms when they rub on clothing or jewelry.  Lesions can be in the way of shaving.  If they become inflamed, they can cause itching,  soreness, or burning.  Removal of a seborrheic keratosis can be accomplished by freezing, burning, or surgery. If any spot bleeds, scabs, or grows rapidly, please return to have it checked, as these can be an indication of a skin cancer.   Gentle Skin Care Guide  1. Bathe no more than once a day.  2. Avoid bathing in hot water  3. Use a mild soap like Dove, Vanicream, Cetaphil, CeraVe. Can use Lever 2000 or Cetaphil antibacterial soap  4. Use soap only where you need it. On most days, use it under your arms, between your legs, and on your feet. Let the water rinse other areas unless visibly dirty.  5. When you get out of the bath/shower, use a towel to gently blot your skin dry, don't rub it.  6. While your skin is still a little damp, apply a moisturizing cream such as Vanicream, CeraVe, Cetaphil, Eucerin, Sarna lotion or plain Vaseline Jelly. For hands apply Neutrogena [place] Hand Cream or Excipial Hand Cream.  7. Reapply moisturizer any time you start to itch or feel dry.  8. Sometimes using free and clear laundry detergents can be helpful. Fabric softener sheets should be avoided. Downy Free & Gentle liquid, or any liquid fabric softener that is free of dyes and perfumes, it acceptable to use  9. If your doctor has given you prescription creams you may apply moisturizers over them        Melanoma ABCDEs  Melanoma is the most dangerous type of skin cancer, and is the  leading cause of death from skin disease.  You are more likely to develop melanoma if you: Have light-colored skin, light-colored eyes, or red or blond hair Spend a lot of time in the sun Tan regularly, either outdoors or in a tanning bed Have had blistering sunburns, especially during childhood Have a close family member who has had a melanoma Have atypical moles or large birthmarks  Early detection of melanoma is key since treatment is typically straightforward and cure rates are extremely high if we catch  it early.   The first sign of melanoma is often a change in a mole or a new dark spot.  The ABCDE system is a way of remembering the signs of melanoma.  A for asymmetry:  The two halves do not match. B for border:  The edges of the growth are irregular. C for color:  A mixture of colors are present instead of an even brown color. D for diameter:  Melanomas are usually (but not always) greater than 35m - the size of a pencil eraser. E for evolution:  The spot keeps changing in size, shape, and color.  Please check your skin once per month between visits. You can use a small mirror in front and a large mirror behind you to keep an eye on the back side or your body.   If you see any new or changing lesions before your next follow-up, please call to schedule a visit.  Please continue daily skin protection including broad spectrum sunscreen SPF 30+ to sun-exposed areas, reapplying every 2 hours as needed when you're outdoors.   Staying in the shade or wearing long sleeves, sun glasses (UVA+UVB protection) and wide brim hats (4-inch brim around the entire circumference of the hat) are also recommended for sun protection.    Due to recent changes in healthcare laws, you may see results of your pathology and/or laboratory studies on MyChart before the doctors have had a chance to review them. We understand that in some cases there may be results that are confusing or concerning to you. Please understand that not all results are received at the same time and often the doctors may need to interpret multiple results in order to provide you with the best plan of care or course of treatment. Therefore, we ask that you please give u[place]2 business days to thoroughly review all your results before contacting the office for clarification. Should we see a critical lab result, you will be contacted sooner.   If You Need Anything After Your Visit  If you have any questions or concerns for your doctor, please call  our main line at [phone] press option 4 to reach your doctor's medical assistant. If no one answers, please leave a voicemail as directed and we will return your call as soon as possible. Messages left after 4 pm will be answered the following business day.   You may also send [place] message via [place] We typically respond to MyChart messages within 1-2 business days.  For prescription refills, please ask your pharmacy to contact our office. Our fax number is [phone]  If you have an urgent issue when the clinic is closed that cannot wait until the next business day, you can page your doctor at the number below.    Please note that while we do our best to be available for urgent issues outside of office hours, we are not available 24/7.   If you have an urgent issue and are  unable to reach [place], you may choose to seek medical care at your doctor's office, retail clinic, urgent care center, or emergency room.  If you have a medical emergency, please immediately call 911 or go to the emergency department.  Pager Numbers  - Dr. Nehemiah Massed: [phone]  - Dr. Laurence Ferrari: [phone]  - Dr. Nicole Kindred: [phone]  In the event of inclement weather, please call our main line at [phone] for an update on the status of any delays or closures.  Dermatology Medication Tips: Please keep the boxes that topical medications come in in order to help keep track of the instructions about where and how to use these. Pharmacies typically print the medication instructions only on the boxes and not directly on the medication tubes.   If your medication is too expensive, please contact our office at [phone] option 4 or send [place] a message through [place].   We are unable to tell what your co-pay for medications will be in advance as this is different depending on your insurance coverage. However, we may be able to find a substitute medication at lower cost or fill out paperwork to get insurance to  cover a needed medication.   If a prior authorization is required to get your medication covered by your insurance company, please allow [place] 1-2 business days to complete this process.  Drug prices often vary depending on where the prescription is filled and some pharmacies may offer cheaper prices.  The website www.goodrx.com contains coupons for medications through different pharmacies. The prices here do not account for what the cost may be with help from insurance (it may be cheaper with your insurance), but the website can give you the price if you did not use any insurance.  - You can print the associated coupon and take it with your prescription to the pharmacy.  - You may also stop by our office during regular business hours and pick up a GoodRx coupon card.  - If you need your prescription sent electronically to a different pharmacy, notify our office through [facility] or by phone at [phone] option 4.     Si Usted Necesita Algo Despus de Su Visita  Tambin puede enviarnos un mensaje a travs de Pharmacist, community. Por lo general respondemos a los mensajes de MyChart en el transcurso de 1 a 2 das hbiles.  Para renovar recetas, por favor pida a su farmacia que se ponga en contacto con nuestra oficina. Harland Dingwall de fax es [place] [phone].  Si tiene un asunto urgente cuando la clnica est cerrada y que no puede esperar hasta el siguiente da hbil, puede llamar/localizar a su doctor(a) al nmero que aparece a continuacin.   Por favor, tenga en cuenta que aunque hacemos todo lo posible para estar disponibles para asuntos urgentes fuera del horario de [place], no estamos disponibles las 24 horas del da, los 7 das de la [place].   Si tiene un problema urgente y no puede comunicarse con nosotros, puede optar por buscar atencin mdica  en el consultorio de su doctor(a), en una clnica privada, en un centro de atencin urgente o en una sala de emergencias.  Si tiene Conservator, museum/gallery, por favor llame inmediatamente al 911 o vaya a la sala de emergencias.  Nmeros de bper  - Dr. Nehemiah Massed: [phone]  - Dra. Moye: [phone]  - Dra. Nicole Kindred: [phone]  En caso de inclemencias del [place], por favor llame a Johnsie Kindred principal al [phone] para una actualizacin sobre el [place]  de cualquier retraso o cierre.  Consejos para la medicacin en dermatologa: Por favor, guarde las cajas en las que vienen los medicamentos de uso tpico para ayudarle a seguir las instrucciones sobre dnde y cmo usarlos. Las farmacias generalmente imprimen las instrucciones del medicamento slo en las cajas y no directamente en los tubos del [place].   Si su medicamento es muy caro, por favor, pngase en contacto con Zigmund Daniel llamando al [phone] y presione la opcin 4 o envenos un mensaje a travs de Pharmacist, community.   No podemos decirle cul ser su copago por los medicamentos por adelantado ya que esto es diferente dependiendo de la cobertura de su seguro. Sin embargo, es posible que podamos encontrar un medicamento sustituto a Electrical engineer un formulario para que el seguro cubra el medicamento que se considera necesario.   Si se requiere una autorizacin previa para que su compaa de seguros [place] su medicamento, por favor permtanos de 1 a 2 das hbiles para completar este proceso.  Los precios de los medicamentos varan con frecuencia dependiendo del Environmental consultant de dnde se surte la receta y alguna farmacias pueden ofrecer precios ms baratos.  El sitio web www.goodrx.com tiene cupones para medicamentos de Airline pilot. Los precios aqu no tienen en cuenta lo que podra costar con la ayuda del seguro (puede ser ms barato con su seguro), pero el sitio web puede darle el precio si no utiliz Research scientist (physical sciences).  - Puede imprimir el cupn correspondiente y llevarlo con su receta a la farmacia.  - Tambin puede pasar por nuestra oficina durante el  horario de atencin regular y Charity fundraiser una tarjeta de cupones de GoodRx.  - Si necesita que su receta se enve electrnicamente a una farmacia diferente, informe a nuestra oficina a travs de MyChart de [place] o por telfono llamando al [phone] y presione la opcin 4.

## 2022-12-16 ENCOUNTER — Ambulatory Visit (INDEPENDENT_AMBULATORY_CARE_PROVIDER_SITE_OTHER): Payer: Medicare Other | Admitting: Nurse Practitioner

## 2022-12-16 ENCOUNTER — Encounter: Payer: Self-pay | Admitting: Nurse Practitioner

## 2022-12-16 VITALS — BP 145/65 | HR 64 | Temp 97.5°F | Resp 16 | Ht 71.0 in | Wt 216.4 lb

## 2022-12-16 DIAGNOSIS — R052 Subacute cough: Secondary | ICD-10-CM

## 2022-12-16 DIAGNOSIS — N528 Other male erectile dysfunction: Secondary | ICD-10-CM

## 2022-12-16 DIAGNOSIS — E1165 Type 2 diabetes mellitus with hyperglycemia: Secondary | ICD-10-CM | POA: Diagnosis not present

## 2022-12-16 DIAGNOSIS — I10 Essential (primary) hypertension: Secondary | ICD-10-CM | POA: Diagnosis not present

## 2022-12-16 MED ORDER — BENZONATATE 200 MG PO CAPS: 200.0000 mg | ORAL_CAPSULE | Freq: Two times a day (BID) | ORAL | 2 refills | Status: DC | PRN

## 2022-12-16 NOTE — Progress Notes (Signed)
[facility] [place], [place] [address]  Internal MEDICINE  Office Visit Note  Patient Name: Mark Wyatt  [id number]  [id number]  Date of Service: 12/16/2022  Chief Complaint  Patient presents with   Follow-up   Diabetes   Hyperlipidemia   Hypertension    HPI Shonta presents for a follow-up visit for  Started on amlodipine, losartan and metoprolol Only had stomach burning once daily -- does not tolerate spicy food well Will check A1c in 3 months .  Taking aspirin in the morning.  Got his hearing aids last week Had an MRI of lumbar spine -- many degenerative issues  Overall BP improved.     Current Medication: Outpatient Encounter Medications as of 12/16/2022  Medication Sig   amLODipine (NORVASC) 2.5 MG tablet    ASPIRIN 81 PO Take by mouth.   atorvastatin (LIPITOR) 40 MG tablet Take 1 tablet (40 mg total) by mouth daily at 6 PM.   benzonatate (TESSALON) 200 MG capsule Take 1 capsule (200 mg total) by mouth 2 (two) times daily as needed for cough.   Blood Glucose Monitoring Suppl (FREESTYLE LITE) w/Device KIT 1 each by Does not apply route daily. Use as directed   cyclobenzaprine (FLEXERIL) 10 MG tablet Take 1 tablet (10 mg total) by mouth at bedtime.   glucose blood (FREESTYLE LITE) test strip Use as instructed as needed once a day DX: E11.65   Lancets 30G MISC Use as directed once a day DX impaired fasting glucose   losartan (COZAAR) 50 MG tablet Take 1 tablet (50 mg total) by mouth 2 (two) times daily.   metoprolol succinate (TOPROL-XL) 50 MG 24 hr tablet Take 100 mg by mouth daily.   nitroGLYCERIN (NITROSTAT) 0.4 MG SL tablet Place 1 tablet (0.4 mg total) under the tongue every 5 (five) minutes as needed for chest pain.   Tadalafil 2.5 MG TABS Take 1 tablet (2.5 mg total) by mouth daily.   [DISCONTINUED] metoprolol tartrate (LOPRESSOR) 25 MG tablet Take 1 tablet (25 mg total) by mouth 2 (two) times daily.   No facility-administered encounter  medications on file as of 12/16/2022.    Surgical History: Past Surgical History:  Procedure Laterality Date   ANAL FISTULOTOMY     CARDIAC CATHETERIZATION     CATARACT EXTRACTION Right 02/2022   CATARACT EXTRACTION Left 03/2022   COLONOSCOPY WITH PROPOFOL N/A 02/13/2021   Procedure: COLONOSCOPY WITH PROPOFOL;  Surgeon: [name], MD;  Location: [facility];  Service: Gastroenterology;  Laterality: N/A;   CORONARY STENT INTERVENTION N/A 02/24/2017   Procedure: Coronary Stent Intervention;  Surgeon: [name], MD;  Location: [facility];  Service: Cardiovascular;  Laterality: N/A;   ESOPHAGOGASTRODUODENOSCOPY N/A 02/10/2021   Procedure: ESOPHAGOGASTRODUODENOSCOPY (EGD);  Surgeon: [name], MD;  Location: [facility]COPY;  Service: Gastroenterology;  Laterality: N/A;   ESOPHAGOGASTRODUODENOSCOPY (EGD) WITH PROPOFOL N/A 01/25/2021   Procedure: ESOPHAGOGASTRODUODENOSCOPY (EGD) WITH PROPOFOL;  Surgeon: [name], MD;  Location: [facility] ENDOSCOPY;  Service: Endoscopy;  Laterality: N/A;   GIVENS CAPSULE STUDY N/A 02/21/2021   Procedure: GIVENS CAPSULE STUDY;  Surgeon: [name], MD;  Location: [facility] ENDOSCOPY;  Service: Endoscopy;  Laterality: N/A;   LEFT HEART CATH AND CORONARY ANGIOGRAPHY N/A 02/24/2017   Procedure: Left Heart Cath and Coronary Angiography;  Surgeon: [name], MD;  Location: [facility];  Service: Cardiovascular;  Laterality: N/A;   TRIGGER FINGER RELEASE Right 10/2021    Medical History: Past Medical  History:  Diagnosis Date   Diabetes mellitus without complication (HCC)    Gout    Hyperlipidemia    Hypertension    Myocardial infarction ([place])    mild   Overactive bladder    [place] [facility]   Trigger finger of all digits of right hand     Family History: Family History  Problem Relation Age of Onset   Diabetes Mother     Social History   Socioeconomic History   Marital status: Married     Spouse name: Not on file   Number of children: Not on file   Years of education: Not on file   Highest education level: Not on file  Occupational History   Not on file  Tobacco Use   Smoking status: Former   Smokeless tobacco: Never  Vaping Use   Vaping Use: Never used  Substance and Sexual Activity   Alcohol use: Yes    Alcohol/week: 6.0 standard drinks of alcohol    Types: 6 Cans of beer per week    Comment: 1 or 2 with dinner   Drug use: No   Sexual activity: Not on file  Other Topics Concern   Not on file  Social History Narrative   Not on file   Social Determinants of Health   Financial Resource Strain: Not on file  Food Insecurity: Not on file  Transportation Needs: Not on file  Physical Activity: Not on file  Stress: Not on file  Social Connections: Not on file  Intimate Partner Violence: Not on file      Review of Systems  Constitutional:  Negative for chills, fatigue and unexpected weight change.  HENT:  Negative for congestion, rhinorrhea, sneezing and sore throat.   Eyes:  Negative for redness.  Respiratory: Negative.  Negative for cough, chest tightness, shortness of breath and wheezing.   Cardiovascular: Negative.  Negative for chest pain and palpitations.  Gastrointestinal:  Negative for abdominal pain, constipation, diarrhea, nausea and vomiting.  Genitourinary:  Negative for dysuria and frequency.  Musculoskeletal:  Negative for back pain, joint swelling and neck pain.  Skin:  Negative for rash.  Neurological: Negative.  Negative for tremors and numbness.  Hematological:  Negative for adenopathy. Does not bruise/bleed easily.  Psychiatric/Behavioral:  Negative for behavioral problems (Depression), sleep disturbance and suicidal ideas. The patient is not nervous/anxious.     Vital Signs: BP (!) 145/65 Comment: 155/76  Pulse 64   Temp (!) 97.5 F (36.4 C)   Resp 16   Ht '5\' 11"'$  (1.803 m)   Wt 216 lb 6.4 oz (98.2 kg)   SpO2 98%   BMI 30.18  kg/m    Physical Exam Vitals reviewed.  Constitutional:      General: He is not in acute distress.    Appearance: Normal appearance. He is obese. He is not ill-appearing.  HENT:     Head: Normocephalic and atraumatic.  Eyes:     Pupils: Pupils are equal, round, and reactive to light.  Cardiovascular:     Rate and Rhythm: Normal rate and regular rhythm.  Pulmonary:     Effort: Pulmonary effort is normal. No respiratory distress.  Neurological:     Mental Status: He is alert and oriented to person, place, and time.  Psychiatric:        Mood and Affect: Mood normal.        Behavior: Behavior normal.        Assessment/Plan: 1. Essential hypertension Medications updated, BP has improved  overall. Continue amlodipine, losartan and metoprolol as prescribed.  - amLODipine (NORVASC) 2.5 MG tablet - metoprolol succinate (TOPROL-XL) 50 MG 24 hr tablet; Take 100 mg by mouth daily.  2. Type 2 diabetes mellitus with hyperglycemia, without long-term current use of insulin ([place]) Will repeat his A1c in 3 months.   3. Subacute cough Take benzonate prn as prescribed.  - benzonatate (TESSALON) 200 MG capsule; Take 1 capsule (200 mg total) by mouth 2 (two) times daily as needed for cough.  Dispense: 30 capsule; Refill: 2  4. Other male erectile dysfunction May still take tadalafil as needed    General Counseling: Sadao verbalizes understanding of the findings of todays visit and agrees with plan of treatment. I have discussed any further diagnostic evaluation that may be needed or ordered today. We also reviewed his medications today. he has been encouraged to call the office with any questions or concerns that should arise related to todays visit.    No orders of the defined types were placed in this encounter.   Meds ordered this encounter  Medications   benzonatate (TESSALON) 200 MG capsule    Sig: Take 1 capsule (200 mg total) by mouth 2 (two) times daily as needed for cough.     Dispense:  30 capsule    Refill:  2    Return in about 3 months (around 03/16/2023) for F/U, Recheck A1C, Raeya Merritts PCP.   Total time spent:30 Minutes Time spent includes review of chart, medications, test results, and follow up plan with the patient.   [place] Controlled Substance Database was reviewed by me.  This patient was seen by Jonetta Osgood, FNP-C in collaboration with Dr. Clayborn Bigness as a part of collaborative care agreement.   Tracker Mance R. Valetta Fuller, MSN, FNP-C Internal medicine

## 2023-01-29 ENCOUNTER — Other Ambulatory Visit: Payer: Self-pay

## 2023-01-29 DIAGNOSIS — L819 Disorder of pigmentation, unspecified: Secondary | ICD-10-CM

## 2023-01-30 ENCOUNTER — Encounter: Payer: Self-pay | Admitting: Vascular Surgery

## 2023-01-30 ENCOUNTER — Ambulatory Visit (INDEPENDENT_AMBULATORY_CARE_PROVIDER_SITE_OTHER): Payer: Medicare Other | Admitting: Vascular Surgery

## 2023-01-30 ENCOUNTER — Ambulatory Visit (HOSPITAL_COMMUNITY)
Admission: RE | Admit: 2023-01-30 | Discharge: 2023-01-30 | Disposition: A | Discharge status: Home / Self Care | Payer: Medicare Other | Source: Ambulatory Visit | Attending: Vascular Surgery | Admitting: Vascular Surgery

## 2023-01-30 VITALS — BP 171/79 | HR 56 | Temp 98.3°F | Resp 20 | Ht 71.0 in | Wt 212.0 lb

## 2023-01-30 DIAGNOSIS — I7300 Raynaud's syndrome without gangrene: Secondary | ICD-10-CM | POA: Diagnosis not present

## 2023-01-30 DIAGNOSIS — L819 Disorder of pigmentation, unspecified: Secondary | ICD-10-CM | POA: Insufficient documentation

## 2023-01-30 NOTE — Progress Notes (Signed)
ASSESSMENT & PLAN   RAYNAUD'S SYNDROME: The patient has a Raynaud's syndrome with transient discoloration of the right ring finger with cold exposure.  This is confirmed by Doppler today.  I explained that this is not a serious issue and is benign.  I instructed him to keep his hands warm as best he can.  He can run his hands under warm water if that helps.  He is not a smoker.  I do not think any further workup is indicated.  I will see him as needed.  REASON FOR CONSULT:    Intermittent color changes of the right ring finger.  The consult is requested by Jennet Maduro, PA.   HPI:   Mark Wyatt is a 72 y.o. male who was referred with intermittent color changes of the right ring finger.  Patient did have surgery on the right fourth finger for trigger finger in January 2023.  About 7 months after that is when he began noticing the discoloration in the finger.  He had no previous history of injury to the hand or frostbite.  He does not use vibrating machinery.  He quit smoking many years ago.  His risk factors for peripheral arterial disease include type 2 diabetes, hypertension, hypercholesterolemia, and a remote history of tobacco use.  He has no other upper extremity symptoms.  He is on aspirin and is on a statin.  Past Medical History:  Diagnosis Date   Diabetes mellitus without complication    Gout    Hyperlipidemia    Hypertension    Myocardial infarction    mild   Overactive bladder    [facility]   Trigger finger of all digits of right hand     Family History  Problem Relation Age of Onset   Diabetes Mother     SOCIAL HISTORY: Social History   Tobacco Use   Smoking status: Former   Smokeless tobacco: Never  Substance Use Topics   Alcohol use: Yes    Alcohol/week: 6.0 standard drinks of alcohol    Types: 6 Cans of beer per week    Comment: 1 or 2 with dinner    Allergies  Allergen Reactions   Penicillins Other (See Comments)     Headache  Headache   Aspirin Other (See Comments)    02/05/2020-pt has been taking baby aspirin before bed and tolerating well.    Current Outpatient Medications  Medication Sig Dispense Refill   amLODipine (NORVASC) 2.5 MG tablet      ASPIRIN 81 PO Take by mouth.     atorvastatin (LIPITOR) 40 MG tablet Take 1 tablet (40 mg total) by mouth daily at 6 PM. 90 tablet 3   benzonatate (TESSALON) 200 MG capsule Take 1 capsule (200 mg total) by mouth 2 (two) times daily as needed for cough. 30 capsule 2   Blood Glucose Monitoring Suppl (FREESTYLE LITE) w/Device KIT 1 each by Does not apply route daily. Use as directed 1 kit 0   cyclobenzaprine (FLEXERIL) 10 MG tablet Take 1 tablet (10 mg total) by mouth at bedtime. 30 tablet 2   glucose blood (FREESTYLE LITE) test strip Use as instructed as needed once a day DX: E11.65 100 each 1   Lancets 30G MISC Use as directed once a day DX impaired fasting glucose 100 each 1   losartan (COZAAR) 50 MG tablet Take 1 tablet (50 mg total) by mouth 2 (two) times daily. 180 tablet 3   metoprolol succinate (TOPROL-XL) 50 MG 24 hr  tablet Take 100 mg by mouth daily.     nitroGLYCERIN (NITROSTAT) 0.4 MG SL tablet Place 1 tablet (0.4 mg total) under the tongue every 5 (five) minutes as needed for chest pain. 30 tablet 2   Tadalafil 2.5 MG TABS Take 1 tablet (2.5 mg total) by mouth daily. 30 tablet 1   No current facility-administered medications for this visit.    REVIEW OF SYSTEMS:  [X]  denotes positive finding, [ ]  denotes negative finding Cardiac  Comments:  Chest pain or chest pressure:    Shortness of breath upon exertion:    Short of breath when lying flat:    Irregular heart rhythm:        Vascular    Pain in calf, thigh, or hip brought on by ambulation:    Pain in feet at night that wakes you up from your sleep:     Blood clot in your veins:    Leg swelling:         Pulmonary    Oxygen at home:    Productive cough:     Wheezing:          Neurologic    Sudden weakness in arms or legs:     Sudden numbness in arms or legs:     Sudden onset of difficulty speaking or slurred speech:    Temporary loss of vision in one eye:     Problems with dizziness:         Gastrointestinal    Blood in stool:     Vomited blood:         Genitourinary    Burning when urinating:     Blood in urine:        Psychiatric    Major depression:         Hematologic    Bleeding problems:    Problems with blood clotting too easily:        Skin    Rashes or ulcers:        Constitutional    Fever or chills:    -  PHYSICAL EXAM:   Vitals:   01/30/23 0921  BP: (!) 171/79  Pulse: (!) 56  Resp: 20  Temp: 98.3 F (36.8 C)  SpO2: 93%  Weight: 212 lb (96.2 kg)  Height: 5\' 11"  (1.803 m)   Body mass index is 29.57 kg/m. GENERAL: The patient is a well-nourished male, in no acute distress. The vital signs are documented above. CARDIAC: There is a regular rate and rhythm.  VASCULAR: He has palpable radial pulses bilaterally. He has palpable femoral and dorsalis pedis pulses bilaterally. PULMONARY: There is good air exchange bilaterally without wheezing or rales. ABDOMEN: Soft and non-tender with normal pitched bowel sounds.  MUSCULOSKELETAL: There are no major deformities. NEUROLOGIC: No focal weakness or paresthesias are detected. SKIN: There are no ulcers or rashes noted. PSYCHIATRIC: The patient has a normal affect.  DATA:    UPPER EXTREMITY ARTERIAL DOPPLER: I have independently interpreted his upper extremity arterial Doppler study.  This shows a diminished digital waveform in the right ring finger with cold immersion.  Waverly Ferrari [facility]

## 2023-01-31 DIAGNOSIS — G8929 Other chronic pain: Secondary | ICD-10-CM | POA: Insufficient documentation

## 2023-02-17 ENCOUNTER — Other Ambulatory Visit: Payer: Self-pay | Admitting: Nurse Practitioner

## 2023-02-17 DIAGNOSIS — I700 Atherosclerosis of aorta: Secondary | ICD-10-CM

## 2023-02-19 ENCOUNTER — Other Ambulatory Visit: Payer: Self-pay

## 2023-02-19 ENCOUNTER — Emergency Department
Admission: EM | Admit: 2023-02-19 | Discharge: 2023-02-19 | Disposition: A | Discharge status: Home / Self Care | Payer: Medicare Other | Attending: Emergency Medicine | Admitting: Emergency Medicine

## 2023-02-19 ENCOUNTER — Emergency Department: Payer: Medicare Other

## 2023-02-19 DIAGNOSIS — E119 Type 2 diabetes mellitus without complications: Secondary | ICD-10-CM | POA: Insufficient documentation

## 2023-02-19 DIAGNOSIS — I2510 Atherosclerotic heart disease of native coronary artery without angina pectoris: Secondary | ICD-10-CM | POA: Insufficient documentation

## 2023-02-19 DIAGNOSIS — R0789 Other chest pain: Secondary | ICD-10-CM | POA: Diagnosis present

## 2023-02-19 DIAGNOSIS — I10 Essential (primary) hypertension: Secondary | ICD-10-CM | POA: Insufficient documentation

## 2023-02-19 LAB — BASIC METABOLIC PANEL
Anion gap: 10 (ref 5–15)
BUN: 15 mg/dL (ref 8–23)
CO2: 26 mmol/L (ref 22–32)
Calcium: 9.6 mg/dL (ref 8.9–10.3)
Chloride: 104 mmol/L (ref 98–111)
Creatinine, Ser: 1.08 mg/dL (ref 0.61–1.24)
GFR, Estimated: >60 mL/min (ref >60)
Glucose, Bld: 135 mg/dL — ABNORMAL HIGH (ref 70–99)
Potassium: 3.6 mmol/L (ref 3.5–5.1)
Sodium: 140 mmol/L (ref 135–145)

## 2023-02-19 LAB — TROPONIN I (HIGH SENSITIVITY)
Troponin I (High Sensitivity): 12 ng/L (ref <18)
Troponin I (High Sensitivity): 10 ng/L (ref <18)

## 2023-02-19 LAB — CBC
HCT: 46.4 % (ref 39.0–52.0)
Hemoglobin: 15.3 g/dL (ref 13.0–17.0)
MCH: 28.3 pg (ref 26.0–34.0)
MCHC: 33 g/dL (ref 30.0–36.0)
MCV: 85.9 fL (ref 80.0–100.0)
Platelets: 254 10*3/uL (ref 150–400)
RBC: 5.4 MIL/uL (ref 4.22–5.81)
RDW: 12.5 % (ref 11.5–15.5)
WBC: 7.5 10*3/uL (ref 4.0–10.5)
nRBC: 0 % (ref 0.0–0.2)

## 2023-02-19 MED ORDER — IOHEXOL 350 MG/ML SOLN
100.0000 mL | Freq: Once | INTRAVENOUS | Status: AC | PRN
  Administered 2023-02-19: 100 mL via INTRAVENOUS

## 2023-02-19 MED ORDER — HYDROMORPHONE HCL 1 MG/ML IJ SOLN
1.0000 mg | Freq: Once | INTRAMUSCULAR | Status: AC
  Administered 2023-02-19: 1 mg via INTRAVENOUS
  Filled 2023-02-19: qty 1

## 2023-02-19 MED ORDER — ONDANSETRON HCL 4 MG/2ML IJ SOLN
4.0000 mg | Freq: Once | INTRAMUSCULAR | Status: AC
  Administered 2023-02-19: 4 mg via INTRAVENOUS
  Filled 2023-02-19: qty 2

## 2023-02-19 NOTE — Discharge Instructions (Signed)
Follow-up with Dr.  Darrold Junker and with your primary care provider.  Return to the ER for new, worsening, or persistent severe chest pain, difficulty breathing, vomiting, weakness, or any other new or worsening symptoms that concern you.

## 2023-02-19 NOTE — ED Notes (Signed)
Pt presents to ED with c/o of ABD/CP that radiates to back, pt states HX of MI in 2018. Pt does appear uncomfortable at this time. Pt states pain started at 1600 today, pt states pt felt fine prior to that.   Pt states last time he ate was at 1230 today and states it was a salad. NAD noted at this time.   Pt does also endorse HX of GI bleed but denies any rectal bleeding or hematemesis. Pt is A&Ox4.

## 2023-02-19 NOTE — ED Triage Notes (Signed)
Patient to ED via POV for centralized CP that radiates into back. Started around 4pm. Hx of MI and hypertension. Taking BP meds regularly. Patient appears very uncomfortably and unable to sit still. Takes baby aspirin daily.

## 2023-02-19 NOTE — ED Notes (Signed)
Patient in CT at this time.

## 2023-02-19 NOTE — ED Provider Notes (Signed)
[facility] Provider Note    Event Date/Time   First MD Initiated Contact with Patient 02/19/23 1803     (approximate)   History   Chest Pain   HPI  Mark Wyatt is a 72 y.o. male with a history of hypertension, hyperlipidemia, diabetes, CAD status post MI, and gout who presents with chest pain, acute onset around 4 PM, severe intensity, and mainly in the center of her chest.  It radiates to the back.  He has had this once before when he was having a heart attack.  The patient reports associated shortness of breath and nausea but no vomiting.  I reviewed the past medical records.  The patient's most recent outpatient counter was on 4/23 with Dr. Darrold Junker from cardiology for follow-up of his chronic conditions.  He has no recent ED visits or admissions.     Physical Exam   Triage Vital Signs: ED Triage Vitals [02/19/23 1755]  Enc Vitals Group     BP (!) 198/84     Pulse Rate 77     Resp 18     Temp (!) 97.5 F (36.4 C)     Temp Source Oral     SpO2 96 %     Weight      Height      Head Circumference      Peak Flow      Pain Score 10     Pain Loc      Pain Edu?      Excl. in GC?     Most recent vital signs: Vitals:   02/19/23 2100 02/19/23 2115  BP: (!) 157/94   Pulse: 82 81  Resp: 15 18  Temp:    SpO2: 95% 96%     General: Alert and oriented, uncomfortable appearing but in no acute distress. CV:  Good peripheral perfusion.  Normal heart sounds. Resp:  Normal effort.  Lungs CTAB. Abd:  No distention.  Other:  No peripheral edema.   ED Results / Procedures / Treatments   Labs (all labs ordered are listed, but only abnormal results are displayed) Labs Reviewed  BASIC METABOLIC PANEL - Abnormal; Notable for the following components:      Result Value   Glucose, Bld 135 (*)    All other components within normal limits  CBC  TROPONIN I (HIGH SENSITIVITY)  TROPONIN I (HIGH SENSITIVITY)     EKG  ED ECG REPORT I, Dionne Bucy, the attending physician, personally viewed and interpreted this ECG.  Date: 02/19/2023 EKG Time: 1755 Rate: 77 Rhythm: normal sinus rhythm with PVCs QRS Axis: normal Intervals: RBBB ST/T Wave abnormalities: normal Narrative Interpretation: no evidence of acute ischemia    RADIOLOGY  Chest x-ray: I independently viewed and interpreted the images; there is no focal consolidation or edema  CT angio chest/abdomen/pelvis:  IMPRESSION:  No acute intrathoracic, abdominal, or pelvic pathology. No aortic  aneurysm or dissection.   PROCEDURES:  Critical Care performed: No  Procedures   MEDICATIONS ORDERED IN ED: Medications  ondansetron (ZOFRAN) injection 4 mg (4 mg Intravenous Given 02/19/23 1820)  HYDROmorphone (DILAUDID) injection 1 mg (1 mg Intravenous Given 02/19/23 1820)  HYDROmorphone (DILAUDID) injection 1 mg (1 mg Intravenous Given 02/19/23 1854)  iohexol (OMNIPAQUE) 350 MG/ML injection 100 mL (100 mLs Intravenous Contrast Given 02/19/23 1906)     IMPRESSION / MDM / ASSESSMENT AND PLAN / ED COURSE  I reviewed the triage vital signs and the nursing notes.  72 year old  male with PMH as noted above including prior CAD presents with acute onset of severe chest pain radiating to his back.  He is quite uncomfortable appearing on exam and is hypertensive with otherwise normal vital signs.  EKG is nonischemic.  Differential diagnosis includes, but is not limited to, ACS, aortic dissection or other vascular etiology, less likely PE.  The pain could also be due to GERD, musculoskeletal, or other benign etiology.  We will obtain lab workup including cardiac enzymes and likely CT angio.  Patient's presentation is most consistent with acute presentation with potential threat to life or bodily function.  The patient is on the cardiac monitor to evaluate for evidence of arrhythmia and/or significant heart rate changes.  ----------------------------------------- 8:07 PM on  02/19/2023 -----------------------------------------  Initial troponin is negative.  CBC and BMP show no acute findings.  CT angio is negative.  ----------------------------------------- 9:10 PM on 02/19/2023 -----------------------------------------  On reassessment, the pain has resolved.  At this time there is no evidence of ACS, vascular emergency, or other acute etiology of the patient's symptoms that would require further workup or monitoring.  He feels well and would like to go home.  Presentation is most consistent with musculoskeletal pain, GERD, or other benign cause.  I counseled the patient and his wife on the results of the workup.  I gave strict return precautions and he expressed understanding.   FINAL CLINICAL IMPRESSION(S) / ED DIAGNOSES   Final diagnoses:  Atypical chest pain     Rx / DC Orders   ED Discharge Orders     None        Note:  This document was prepared using Dragon voice recognition software and may include unintentional dictation errors.    Dionne Bucy, MD 02/19/23 [phone]

## 2023-02-21 ENCOUNTER — Telehealth: Payer: Self-pay | Admitting: Nurse Practitioner

## 2023-02-21 NOTE — Telephone Encounter (Signed)
Lvm to schedule ED follow up-Toni 

## 2023-02-24 ENCOUNTER — Ambulatory Visit (INDEPENDENT_AMBULATORY_CARE_PROVIDER_SITE_OTHER): Payer: Medicare Other | Admitting: Nurse Practitioner

## 2023-02-24 ENCOUNTER — Ambulatory Visit
Admission: RE | Admit: 2023-02-24 | Discharge: 2023-02-24 | Disposition: A | Discharge status: Home / Self Care | Payer: Medicare Other | Source: Ambulatory Visit | Attending: Nurse Practitioner | Admitting: Nurse Practitioner

## 2023-02-24 ENCOUNTER — Encounter: Payer: Self-pay | Admitting: Nurse Practitioner

## 2023-02-24 ENCOUNTER — Telehealth: Payer: Self-pay | Admitting: Nurse Practitioner

## 2023-02-24 VITALS — BP 130/76 | HR 60 | Temp 97.1°F | Resp 16 | Ht 71.0 in | Wt 206.8 lb

## 2023-02-24 DIAGNOSIS — R160 Hepatomegaly, not elsewhere classified: Secondary | ICD-10-CM | POA: Diagnosis present

## 2023-02-24 DIAGNOSIS — R140 Abdominal distension (gaseous): Secondary | ICD-10-CM | POA: Insufficient documentation

## 2023-02-24 DIAGNOSIS — R1011 Right upper quadrant pain: Secondary | ICD-10-CM | POA: Insufficient documentation

## 2023-02-24 DIAGNOSIS — K810 Acute cholecystitis: Secondary | ICD-10-CM

## 2023-02-24 LAB — HEPATIC FUNCTION PANEL
Albumin: 3.9 g/dL (ref 3.8–4.8)
Alkaline Phosphatase: 172 IU/L — ABNORMAL HIGH (ref 44–121)
Bilirubin Total: 1.1 mg/dL (ref 0.0–1.2)
Bilirubin, Direct: 0.54 mg/dL — ABNORMAL HIGH (ref 0.00–0.40)
Total Protein: 7 g/dL (ref 6.0–8.5)

## 2023-02-24 LAB — ACUTE HEP PANEL AND HEP B SURFACE AB

## 2023-02-24 MED ORDER — HYDROCODONE-ACETAMINOPHEN 5-325 MG PO TABS
1.0000 | ORAL_TABLET | ORAL | 0 refills | Status: AC | PRN
Start: 2023-02-24 — End: 2023-03-01

## 2023-02-24 NOTE — Telephone Encounter (Signed)
I called Mark Wyatt and spoke with him and his wife, Santino Schiefer about the results. His RUQ ultrasound showed diffuse gallbladder wall thickening with positive murphy's sign. This is consistent with cholecystitis which is treated by surgical removal of the gall bladder

## 2023-02-24 NOTE — Progress Notes (Signed)
[facility] [address] [place], [place] [address]  Internal MEDICINE  Office Visit Note  Patient Name: Mark Wyatt  [id number]  [id number]  Date of Service: 02/24/2023  Chief Complaint  Patient presents with   Follow-up    ED f/u chest pain    HPI Deandrea presents for a follow-up visit for ED visit for chest and side pain Workup in ER was negative for ACS or MI. ER physician said the pain is most consistent with musculoskeletal pain.  --Still having some epigastric pain and pain in the middle of his back. Also reports abdominal tenderness and distention Liver seems swollen, extremely tender and abdomen distended.  Pain is rated 20 on a scale of 0-10. Difficulty moving or laying down due to severity of the pain. Patient takes a muscle relaxant at home which has not helped.     Current Medication: Outpatient Encounter Medications as of 02/24/2023  Medication Sig   amLODipine (NORVASC) 2.5 MG tablet    ASPIRIN 81 PO Take by mouth.   atorvastatin (LIPITOR) 40 MG tablet TAKE 1 TABLET(40 MG) BY MOUTH DAILY AT 6 PM   benzonatate (TESSALON) 200 MG capsule Take 1 capsule (200 mg total) by mouth 2 (two) times daily as needed for cough.   Blood Glucose Monitoring Suppl (FREESTYLE LITE) w/Device KIT 1 each by Does not apply route daily. Use as directed   cyclobenzaprine (FLEXERIL) 10 MG tablet Take 1 tablet (10 mg total) by mouth at bedtime.   glucose blood (FREESTYLE LITE) test strip Use as instructed as needed once a day DX: E11.65   HYDROcodone-acetaminophen (NORCO/VICODIN) 5-325 MG tablet Take 1 tablet by mouth every 4 (four) hours as needed for up to 5 days for moderate pain or severe pain.   Lancets 30G MISC Use as directed once a day DX impaired fasting glucose   losartan (COZAAR) 50 MG tablet Take 1 tablet (50 mg total) by mouth 2 (two) times daily.   metoprolol succinate (TOPROL-XL) 50 MG 24 hr tablet Take 100 mg by mouth daily.   nitroGLYCERIN (NITROSTAT) 0.4 MG SL  tablet Place 1 tablet (0.4 mg total) under the tongue every 5 (five) minutes as needed for chest pain.   Tadalafil 2.5 MG TABS Take 1 tablet (2.5 mg total) by mouth daily.   [DISCONTINUED] metoprolol tartrate (LOPRESSOR) 25 MG tablet Take 1 tablet (25 mg total) by mouth 2 (two) times daily.   No facility-administered encounter medications on file as of 02/24/2023.    Surgical History: Past Surgical History:  Procedure Laterality Date   ANAL FISTULOTOMY     CARDIAC CATHETERIZATION     CATARACT EXTRACTION Right 02/2022   CATARACT EXTRACTION Left 03/2022   COLONOSCOPY WITH PROPOFOL N/A 02/13/2021   Procedure: COLONOSCOPY WITH PROPOFOL;  Surgeon: Toney Reil, MD;  Location: [facility] ENDOSCOPY;  Service: Gastroenterology;  Laterality: N/A;   CORONARY STENT INTERVENTION N/A 02/24/2017   Procedure: Coronary Stent Intervention;  Surgeon: Alwyn Pea, MD;  Location: [facility] INVASIVE CV LAB;  Service: Cardiovascular;  Laterality: N/A;   ESOPHAGOGASTRODUODENOSCOPY N/A 02/10/2021   Procedure: ESOPHAGOGASTRODUODENOSCOPY (EGD);  Surgeon: Wyline Mood, MD;  Location: [facility] ENDOSCOPY;  Service: Gastroenterology;  Laterality: N/A;   ESOPHAGOGASTRODUODENOSCOPY (EGD) WITH PROPOFOL N/A 01/25/2021   Procedure: ESOPHAGOGASTRODUODENOSCOPY (EGD) WITH PROPOFOL;  Surgeon: Pasty Spillers, MD;  Location: [facility] ENDOSCOPY;  Service: Endoscopy;  Laterality: N/A;   GIVENS CAPSULE STUDY N/A 02/21/2021   Procedure: GIVENS CAPSULE STUDY;  Surgeon: Pasty Spillers, MD;  [facility]  END[facility];  Service: Endoscopy;  Laterality: N/A;   LEFT HEART CATH AND CORONARY ANGIOGRAPHY N/A 02/24/2017   Procedure: Left Heart Cath and Coronary Angiography;  Surgeon: Dalia Heading, MD;  Location: [facility] INVASIVE CV LAB;  Service: Cardiovascular;  Laterality: N/A;   TRIGGER FINGER RELEASE Right 10/2021    Medical History: Past Medical History:  Diagnosis Date   Diabetes mellitus without complication (HCC)    Gout     Hyperlipidemia    Hypertension    Myocardial infarction (HCC)    mild   Overactive bladder    [facility]   Trigger finger of all digits of right hand     Family History: Family History  Problem Relation Age of Onset   Diabetes Mother     Social History   Socioeconomic History   Marital status: Married    Spouse name: Not on file   Number of children: Not on file   Years of education: Not on file   Highest education level: Not on file  Occupational History   Not on file  Tobacco Use   Smoking status: Former   Smokeless tobacco: Never  Vaping Use   Vaping Use: Never used  Substance and Sexual Activity   Alcohol use: Yes    Alcohol/week: 6.0 standard drinks of alcohol    Types: 6 Cans of beer per week    Comment: 1 or 2 with dinner   Drug use: No   Sexual activity: Not on file  Other Topics Concern   Not on file  Social History Narrative   Not on file   Social Determinants of Health   Financial Resource Strain: Not on file  Food Insecurity: Not on file  Transportation Needs: Not on file  Physical Activity: Not on file  Stress: Not on file  Social Connections: Not on file  Intimate Partner Violence: Not on file      Review of Systems  Constitutional:  Positive for appetite change and fatigue.  Respiratory: Negative.  Negative for cough, chest tightness, shortness of breath and wheezing.   Cardiovascular: Negative.  Negative for chest pain and palpitations.  Gastrointestinal:  Positive for abdominal distention, abdominal pain and nausea. Negative for blood in stool, constipation, diarrhea and vomiting.  Psychiatric/Behavioral:  Positive for sleep disturbance.     Vital Signs: BP 130/76   Pulse 60   Temp (!) 97.1 F (36.2 C)   Resp 16   Ht 5\' 11"  (1.803 m)   Wt 206 lb 12.8 oz (93.8 kg)   SpO2 96%   BMI 28.84 kg/m    Physical Exam Vitals reviewed.  Constitutional:      General: He is not in acute distress.    Appearance: Normal  appearance. He is ill-appearing.  HENT:     Head: Normocephalic and atraumatic.  Eyes:     Pupils: Pupils are equal, round, and reactive to light.  Cardiovascular:     Rate and Rhythm: Normal rate and regular rhythm.  Pulmonary:     Effort: Pulmonary effort is normal. No respiratory distress.  Abdominal:     General: Bowel sounds are increased. There is distension.     Palpations: Abdomen is rigid. There is hepatomegaly. There is no fluid wave, splenomegaly, mass or pulsatile mass.     Tenderness: There is abdominal tenderness in the right upper quadrant, right lower quadrant and epigastric area. There is guarding.     Hernia: No hernia is present.  Neurological:     Mental  Status: He is alert and oriented to person, place, and time.  Psychiatric:        Mood and Affect: Mood normal.        Behavior: Behavior normal.        Assessment/Plan: 1. RUQ pain Stat ultrasound ordered, pain medication prescribed and labs ordered to rule out other causes.  - US Abdomen Limited RUQ (LIVER/GB); Future - HYDROcodone-acetaminophen (NORCO/VICODIN) 5-325 MG tablet; Take 1 tablet by mouth every 4 (four) hours as needed for up to 5 days for moderate pain or severe pain.  Dispense: 30 tablet; Refill: 0 - Acute Hep Panel & Hep B Surface Ab - Hepatic function panel  2. Abdominal distention Stat ultrasound ordered, pain medication and labs ordered - US Abdomen Limited RUQ (LIVER/GB); Future - HYDROcodone-acetaminophen (NORCO/VICODIN) 5-325 MG tablet; Take 1 tablet by mouth every 4 (four) hours as needed for up to 5 days for moderate pain or severe pain.  Dispense: 30 tablet; Refill: 0 - Acute Hep Panel & Hep B Surface Ab - Hepatic function panel  3. Hepatomegaly Ultrasound and labs ordered.  - US Abdomen Limited RUQ (LIVER/GB); Future - Acute Hep Panel & Hep B Surface Ab - Hepatic function panel   General Counseling: Fahad verbalizes understanding of the findings of todays visit and agrees  with plan of treatment. I have discussed any further diagnostic evaluation that may be needed or ordered today. We also reviewed his medications today. he has been encouraged to call the office with any questions or concerns that should arise related to todays visit.    Orders Placed This Encounter  Procedures   US Abdomen Limited RUQ (LIVER/GB)   Acute Hep Panel & Hep B Surface Ab   Hepatic function panel    Meds ordered this encounter  Medications   HYDROcodone-acetaminophen (NORCO/VICODIN) 5-325 MG tablet    Sig: Take 1 tablet by mouth every 4 (four) hours as needed for up to 5 days for moderate pain or severe pain.    Dispense:  30 tablet    Refill:  0    Fill script today    Return if symptoms worsen or fail to improve.   Total time spent:30 Minutes Time spent includes review of chart, medications, test results, and follow up plan with the patient.    Controlled Substance Database was reviewed by me.  This patient was seen by Sallyanne Kuster, FNP-C in collaboration with Dr. Beverely Risen as a part of collaborative care agreement.   Arabell Neria R. Tedd Sias, MSN, FNP-C Internal medicine

## 2023-02-25 ENCOUNTER — Other Ambulatory Visit: Payer: Self-pay | Admitting: Nurse Practitioner

## 2023-02-25 DIAGNOSIS — I10 Essential (primary) hypertension: Secondary | ICD-10-CM

## 2023-02-25 LAB — HEPATIC FUNCTION PANEL
ALT: 24 IU/L (ref 0–44)
AST: 27 IU/L (ref 0–40)

## 2023-02-25 LAB — ACUTE HEP PANEL AND HEP B SURFACE AB: Hepatitis B Surface Ag: NEGATIVE

## 2023-03-03 ENCOUNTER — Ambulatory Visit (INDEPENDENT_AMBULATORY_CARE_PROVIDER_SITE_OTHER): Payer: Medicare Other | Admitting: Surgery

## 2023-03-03 ENCOUNTER — Telehealth: Payer: Self-pay

## 2023-03-03 ENCOUNTER — Encounter: Payer: Self-pay | Admitting: Surgery

## 2023-03-03 ENCOUNTER — Telehealth: Payer: Self-pay | Admitting: Surgery

## 2023-03-03 VITALS — BP 157/76 | HR 55 | Temp 97.7°F | Ht 70.5 in | Wt 203.2 lb

## 2023-03-03 DIAGNOSIS — K810 Acute cholecystitis: Secondary | ICD-10-CM | POA: Diagnosis not present

## 2023-03-03 MED ORDER — AMOXICILLIN-POT CLAVULANATE 875-125 MG PO TABS: 1.0000 | ORAL_TABLET | Freq: Two times a day (BID) | ORAL | 0 refills | Status: AC

## 2023-03-03 NOTE — Telephone Encounter (Signed)
Faxed cardiac clearance to Dr. Harold Hedge at [phone].

## 2023-03-03 NOTE — Patient Instructions (Addendum)
If you have any concerns or questions, please feel free to call our office. See follow up appointment below.   Cholecystitis Cholecystitis is irritation and swelling (inflammation) of the gallbladder. The gallbladder: Is an organ that is shaped like a pear. Is under the liver on the right side of the body. Stores bile. Bile helps the body break down (digest) the fats in food. This condition can occur all of a sudden. It needs to be treated. What are the causes? This condition may be caused by stones or lumps that form in the gallbladder (gallstones). Gallstones can block the tube (duct) that carries bile out of your gallbladder. Other causes include: Damage to the gallbladder due to less blood flow. Germs in the bile ducts. Scars, kinks, or adhesions in the bile ducts. Abnormal growths (tumors) in the liver, pancreas, or gallbladder. What increases the risk? You are more likely to develop this condition if: You are male and between the ages of 78-62. You take birth control pills. You use estrogen. You take certain medicines that make you more likely to develop gallstones. You are overweight (obese). You have a very bad reaction to an infection (sepsis). You have been hospitalized due to a serious condition, such as a burn or illness. You have not eaten or drank for a long time. What are the signs or symptoms? Symptoms of this condition include: Pain in the upper right part of the belly (abdomen). A lump over the gallbladder. Bloating in the belly. Feeling sick to your stomach (nauseous). Vomiting. Fever. Chills. How is this treated? This condition may be treated with: Medicines to treat pain. Giving fluids through an IV tube. Not eating or drinking (fasting). Antibiotic medicines. Surgery to take out your gallbladder. Gallbladder drainage. Follow these instructions at home: Medicines  Take over-the-counter and prescription medicines only as told by your doctor. If you  were prescribed an antibiotic medicine, take it as told by your doctor. Do not stop taking it even if you start to feel better. General instructions Follow instructions from your doctor about what to eat or drink. Do not eat or drink anything that makes you sick again. Do not smoke or use any products that contain nicotine or tobacco. If you need help quitting, ask your doctor. Keep all follow-up visits. Contact a doctor if: You have pain and your medicine does not help. You have a fever. Get help right away if: Your pain moves to: Another part of your belly. Your back. Your symptoms do not go away. You have new symptoms. These symptoms may be an emergency. Get help right away. Call 911. Do not wait to see if the symptoms will go away. Do not drive yourself to the hospital. Summary This condition may be caused by stones or lumps that form in the gallbladder (gallstones). A common symptom is pain in your belly. This condition may be treated with surgery to take out your gallbladder. Follow instructions from your doctor about what to eat or drink. This information is not intended to replace advice given to you by your health care provider. Make sure you discuss any questions you have with your health care provider.  Gallbladder Eating Plan  High blood cholesterol, obesity, a sedentary lifestyle, an unhealthy diet, and diabetes are risk factors for developing gallstones. If you have a gallbladder condition, you may have trouble digesting fats and tolerating high fat intake. Eating a low-fat diet can help reduce your symptoms and may be helpful before and after having surgery  to remove your gallbladder (cholecystectomy). Your health care provider may recommend that you work with a dietitian to help you reduce the amount of fat in your diet. What are tips for following this plan? General guidelines Limit your fat intake to less than 30% of your total daily calories. If you eat around 1,800  calories each day, this means eating less than 60 grams (g) of fat per day. Fat is an important part of a healthy diet. Eating a low-fat diet can make it hard to maintain a healthy body weight. Ask your dietitian how much fat, calories, and other nutrients you need each day. Eat small, frequent meals throughout the day instead of three large meals. Drink at least 8-10 cups (1.9-2.4 L) of fluid a day. Drink enough fluid to keep your urine pale yellow. If you drink alcohol: Limit how much you have to: 0-1 drink a day for women who are not pregnant. 0-2 drinks a day for men. Know how much alcohol is in a drink. In the [place] one drink equals one 12 oz bottle of beer (355 mL), one 5 oz glass of wine (148 mL), or one 1 oz glass of hard liquor (44 mL). Reading food labels  Check nutrition facts on food labels for the amount of fat per serving. Choose foods with less than 3 grams of fat per serving. Shopping Choose nonfat and low-fat healthy foods. Look for the words "nonfat," "low-fat," or "fat-free." Avoid buying processed or prepackaged foods. Cooking Cook using low-fat methods, such as baking, broiling, grilling, or boiling. Cook with small amounts of healthy fats, such as olive oil, grapeseed oil, canola oil, avocado oil, or sunflower oil. What foods are recommended?  All fresh, frozen, or canned fruits and vegetables. Whole grains. Low-fat or nonfat (skim) milk and yogurt. Lean meat, skinless poultry, fish, eggs, and beans. Low-fat protein supplement powders or drinks. Spices and herbs. The items listed above may not be a complete list of foods and beverages you can eat and drink. Contact a dietitian for more information. What foods are not recommended? High-fat foods. These include baked goods, fast food, fatty cuts of meat, ice cream, french toast, sweet rolls, pizza, cheese bread, foods covered with butter, creamy sauces, or cheese. Fried foods. These include french fries, tempura,  battered fish, breaded chicken, fried breads, and sweets. Foods that cause bloating and gas. The items listed above may not be a complete list of foods that you should avoid. Contact a dietitian for more information. Summary A low-fat diet can be helpful if you have a gallbladder condition, or before and after gallbladder surgery. Limit your fat intake to less than 30% of your total daily calories. This is about 60 g of fat if you eat 1,800 calories each day. Eat small, frequent meals throughout the day instead of three large meals. This information is not intended to replace advice given to you by your health care provider. Make sure you discuss any questions you have with your health care provider. Document Revised: 09/21/2021 Document Reviewed: 09/21/2021 Elsevier Patient Education  2023 [employer].

## 2023-03-03 NOTE — Progress Notes (Signed)
03/03/2023  Reason for Visit:  Acute cholecystitis  Requesting Provider:  Sallyanne Kuster, NP  History of Present Illness: Mark Wyatt is a 72 y.o. male presenting for evaluation of acute cholecystitis.  The patient presented initially to the ED on 02/19/23 with chest pain in the center of his chest, radiating to his back.  He had very similar symptoms about 5 years ago when he had an MI.  In the ED, workup was overall negative with a CTA c/a/p that was negative, and normal troponin.  He reports his pain continued and got progressively worse and also started migrating more to his RUQ.  He saw his PCP on 02/24/23 and had stat labs and U/S.  His labs showed elevated alk phos and direct bilirubin, and his U/S showed diffuse gallbladder wall thickening and a positive Murphy's sign, highly suspicious for acute cholecystitis.  The pain was associated with nausea, but no emesis, and no fevers/chills.  He also had diarrhea initially.  He has not been eating much as he's afraid of the pain getting worse.    Today, his pain is better.  He reports that on 5/7 it started easing up.  Has not had any worsening issues again since.    Past Medical History: Past Medical History:  Diagnosis Date   Diabetes mellitus without complication (HCC)    Gout    Hyperlipidemia    Hypertension    Myocardial infarction (HCC)    mild   Overactive bladder    [facility]   Trigger finger of all digits of right hand      Past Surgical History: Past Surgical History:  Procedure Laterality Date   ANAL FISTULOTOMY     CARDIAC CATHETERIZATION     CATARACT EXTRACTION Right 02/2022   CATARACT EXTRACTION Left 03/2022   COLONOSCOPY WITH PROPOFOL N/A 02/13/2021   Procedure: COLONOSCOPY WITH PROPOFOL;  Surgeon: Toney Reil, MD;  Location: [facility] ENDOSCOPY;  Service: Gastroenterology;  Laterality: N/A;   CORONARY STENT INTERVENTION N/A 02/24/2017   Procedure: Coronary Stent Intervention;  Surgeon: Alwyn Pea, MD;  Location: [facility] INVASIVE CV LAB;  Service: Cardiovascular;  Laterality: N/A;   ESOPHAGOGASTRODUODENOSCOPY N/A 02/10/2021   Procedure: ESOPHAGOGASTRODUODENOSCOPY (EGD);  Surgeon: Wyline Mood, MD;  Location[facility] ENDOSCOPY;  Service: Gastroenterology;  Laterality: N/A;   ESOPHAGOGASTRODUODENOSCOPY (EGD) WITH PROPOFOL N/A 01/25/2021   Procedure: ESOPHAGOGASTRODUODENOSCOPY (EGD) WITH PROPOFOL;  Surgeon: Pasty Spillers, MD;  Location: [facility] ENDOSCOPY;  Service: Endoscopy;  Laterality: N/A;   GIVENS CAPSULE STUDY N/A 02/21/2021   Procedure: GIVENS CAPSULE STUDY;  Surgeon: Pasty Spillers, MD;  Location: [facility] ENDOSCOPY;  Service: Endoscopy;  Laterality: N/A;   LEFT HEART CATH AND CORONARY ANGIOGRAPHY N/A 02/24/2017   Procedure: Left Heart Cath and Coronary Angiography;  Surgeon: Dalia Heading, MD;  Location: [facility] INVASIVE CV LAB;  Service: Cardiovascular;  Laterality: N/A;   TRIGGER FINGER RELEASE Right 10/2021    Home Medications: Prior to Admission medications   Medication Sig Start Date End Date Taking? Authorizing Provider  amLODipine (NORVASC) 2.5 MG tablet  10/31/22  Yes [provider]  amoxicillin-clavulanate (AUGMENTIN) 875-125 MG tablet Take 1 tablet by mouth 2 (two) times daily for 10 days. 03/03/23 03/13/23 Yes Kennede Lusk, Elita Quick, MD  ASPIRIN 81 PO Take by mouth.   Yes [provider]  atorvastatin (LIPITOR) 40 MG tablet TAKE 1 TABLET(40 MG) BY MOUTH DAILY AT 6 PM 02/17/23  Yes Abernathy, Alyssa, NP  cyclobenzaprine (FLEXERIL) 10 MG tablet Take 1 tablet (10  mg total) by mouth at bedtime. 09/10/22  Yes Abernathy, Alyssa, NP  losartan (COZAAR) 50 MG tablet TAKE 1 TABLET(50 MG) BY MOUTH TWICE DAILY 02/25/23  Yes Abernathy, Alyssa, NP  metoprolol succinate (TOPROL-XL) 50 MG 24 hr tablet Take 100 mg by mouth daily. 09/25/22  Yes [provider]  Tadalafil 2.5 MG TABS Take 1 tablet (2.5 mg total) by mouth daily. 09/10/22  Yes Abernathy, Arlyss Repress, NP   metoprolol tartrate (LOPRESSOR) 25 MG tablet Take 1 tablet (25 mg total) by mouth 2 (two) times daily. 02/25/17 09/06/19  Adrian Saran, MD    Allergies: Allergies  Allergen Reactions   Penicillins Other (See Comments)    Headache  Headache   Aspirin Other (See Comments)    02/05/2020-pt has been taking baby aspirin before bed and tolerating well.    Social History:  reports that he has quit smoking. He has never used smokeless tobacco. He reports current alcohol use of about 6.0 standard drinks of alcohol per week. He reports that he does not use drugs.   Family History: Family History  Problem Relation Age of Onset   Diabetes Mother     Review of Systems: Review of Systems  Constitutional:  Negative for chills and fever.  HENT:  Negative for hearing loss.   Respiratory:  Negative for shortness of breath.   Cardiovascular:  Negative for chest pain.  Gastrointestinal:  Positive for abdominal pain, diarrhea and nausea. Negative for vomiting.  Genitourinary:  Negative for dysuria.  Musculoskeletal:  Negative for myalgias.  Skin:  Negative for rash.  Neurological:  Negative for dizziness.  Psychiatric/Behavioral:  Negative for depression.     Physical Exam BP (!) 157/76   Pulse (!) 55   Temp 97.7 F (36.5 C) (Oral)   Ht 5' 10.5" (1.791 m)   Wt 203 lb 3.2 oz (92.2 kg)   SpO2 97%   BMI 28.74 kg/m  CONSTITUTIONAL: No acute distress, well nourished. HEENT:  Normocephalic, atraumatic, extraocular motion intact. NECK: Trachea is midline, and there is no jugular venous distension.  RESPIRATORY:  Lungs are clear, and breath sounds are equal bilaterally. Normal respiratory effort without pathologic use of accessory muscles. CARDIOVASCULAR: Heart is regular without murmurs, gallops, or rubs. GI: The abdomen is soft, non-distended, with some residual soreness to deep palpation in the RUQ.  Negative Murphy's sign. MUSCULOSKELETAL:  Normal muscle strength and tone in all four  extremities.  No peripheral edema or cyanosis. SKIN: Skin turgor is normal. There are no pathologic skin lesions.  NEUROLOGIC:  Motor and sensation is grossly normal.  Cranial nerves are grossly intact. PSYCH:  Alert and oriented to person, place and time. Affect is normal.  Laboratory Analysis: Labs from 02/19/2023: Sodium 140, potassium 3.6, chloride 104, CO2 26, BUN 15, creatinine 1.08.  WBC 7.5, hemoglobin 15.3, hematocrit 46.4, platelets 254.  Labs from 02/24/2023: Total bilirubin 1.1, direct bilirubin 0.54, AST 27, ALT 24, alkaline phosphatase 172.  Imaging: CTA chest, abdomen, pelvis on 02/19/2023: IMPRESSION: No acute intrathoracic, abdominal, or pelvic pathology. No aortic aneurysm or dissection.  Ultrasound RUQ on 02/24/2023: IMPRESSION: Diffuse gallbladder wall thickening with positive sonographic Murphy sign is suspicious for cholecystitis.  Assessment and Plan: This is a 72 y.o. male with acute cholecystitis  -Discussed with the patient the findings on his ultrasound, CT scan, and laboratory studies.  Overall his initial CT scan did not show any inflammatory changes about the gallbladder.  However discussed with the patient that a CT scan is not usually the  best imaging to look at the gallbladder itself.  Ultrasound done 5 days later showed diffuse gallbladder wall thickening up to 6 mm with positive Murphy sign highly suspicious for acute cholecystitis.  Patient reports that he has not been given any antibiotics.  He has otherwise not been told to go to the emergency room and his symptoms actually started improving on their own. - Discussed with him that there may be sludge or small stones are not well-seen on ultrasound that may have caused a partial obstruction of the gallbladder resulting in the inflammatory changes.  Now a week out from this episode, and fortunately inflammatory changes have fairly set and we will need to wait until the inflammation has fully subsided in order to  be able to go back more safely to do surgery for him.  As a precaution, I would want to make sure that he is treated for his cholecystitis episode and at least we will give him a course of antibiotics for 10 days.  Discussed with him that we will need to wait about 2 months before proceeding with surgery to allow for any inflammation and scarring that is happening right now to start subsiding and get better.  In the meantime, he definitely needs a strict low-fat diet to try to prevent any further episodes.  Will also send clearance form to his cardiology team in preparation for surgery. - Discussed with him the plan for an eventual robotic assisted cholecystectomy and reviewed briefly the surgery with him.  Currently we will plan on doing surgery on 05/06/2023.  He will follow-up with me towards the end of June at which time we will discuss further the details of surgery. - Patient understands this plan and all of his questions have been answered.  I spent 40 minutes dedicated to the care of this patient on the date of this encounter to include pre-visit review of records, face-to-face time with the patient discussing diagnosis and management, and any post-visit coordination of care.   Howie Ill, MD [facility]

## 2023-03-03 NOTE — Telephone Encounter (Signed)
Patient has been advised of Pre-Admission date/time, and Surgery date at [facility].  Surgery Date: 05/06/23 Preadmission Testing Date: 04/25/23 (phone 8a-1p)  Patient has been made aware to call [phone], between 1-3:00pm the day before surgery, to find out what time to arrive for surgery.

## 2023-03-06 NOTE — Progress Notes (Signed)
Cardiology clearance has been received from Dr Lady Gary. The patient is cleared at Low risk for surgery.

## 2023-03-06 NOTE — Telephone Encounter (Signed)
He was referred to general surgery urgently on 02/24/2023 and otherwise instructed to go to the ER if the pain becomes intolerable

## 2023-03-19 ENCOUNTER — Encounter: Payer: Self-pay | Admitting: Nurse Practitioner

## 2023-03-19 ENCOUNTER — Ambulatory Visit (INDEPENDENT_AMBULATORY_CARE_PROVIDER_SITE_OTHER): Payer: Medicare Other | Admitting: Nurse Practitioner

## 2023-03-19 VITALS — BP 140/82 | HR 89 | Temp 98.2°F | Resp 16 | Ht 70.5 in | Wt 204.6 lb

## 2023-03-19 DIAGNOSIS — G8929 Other chronic pain: Secondary | ICD-10-CM

## 2023-03-19 DIAGNOSIS — K810 Acute cholecystitis: Secondary | ICD-10-CM | POA: Diagnosis not present

## 2023-03-19 DIAGNOSIS — I10 Essential (primary) hypertension: Secondary | ICD-10-CM

## 2023-03-19 DIAGNOSIS — M5442 Lumbago with sciatica, left side: Secondary | ICD-10-CM

## 2023-03-19 DIAGNOSIS — M5441 Lumbago with sciatica, right side: Secondary | ICD-10-CM

## 2023-03-19 DIAGNOSIS — I700 Atherosclerosis of aorta: Secondary | ICD-10-CM

## 2023-03-19 DIAGNOSIS — E1165 Type 2 diabetes mellitus with hyperglycemia: Secondary | ICD-10-CM | POA: Diagnosis not present

## 2023-03-19 LAB — POCT GLYCOSYLATED HEMOGLOBIN (HGB A1C): Hemoglobin A1C: 6.7 % — AB (ref 4.0–5.6)

## 2023-03-19 MED ORDER — AMLODIPINE BESYLATE 2.5 MG PO TABS
2.5000 mg | ORAL_TABLET | Freq: Every day | ORAL | 1 refills | Status: DC
Start: 2023-03-19 — End: 2023-09-16

## 2023-03-19 MED ORDER — CYCLOBENZAPRINE HCL 10 MG PO TABS
10.0000 mg | ORAL_TABLET | Freq: Every day | ORAL | 2 refills | Status: DC
Start: 2023-03-19 — End: 2023-10-08

## 2023-03-19 MED ORDER — METOPROLOL SUCCINATE ER 50 MG PO TB24: 50.0000 mg | ORAL_TABLET | Freq: Every day | ORAL | 3 refills | Status: DC

## 2023-03-19 NOTE — Progress Notes (Signed)
[facility] [address] [place], [place] [address]  Internal MEDICINE  Office Visit Note  Patient Name: Mark Wyatt  [id number]  [id number]  Date of Service: 03/19/2023  Chief Complaint  Patient presents with   Diabetes   Hyperlipidemia   Follow-up    HPI Mark Wyatt presents for a follow-up visit for diabetes, cholecystitis, hypertension and hyperlipidemia diabetes -- has been sick with cholecystitis, A1c elevated ay 6.7.  Cholecystitis -- treated with augmentin and scheduled for cholecystectomy in July. Was going on cruise but had to cancel.  Hypertension -- taking amlodipine, losartan and metoprolol and BP is stable.  Hyperlipidemia -- cholesterol is at target of less than 55 LDL with atorvastatin 40 mg daily. Most recent lab -- LDL is 52     Current Medication: Outpatient Encounter Medications as of 03/19/2023  Medication Sig   ASPIRIN 81 PO Take by mouth.   atorvastatin (LIPITOR) 40 MG tablet TAKE 1 TABLET(40 MG) BY MOUTH DAILY AT 6 PM   losartan (COZAAR) 50 MG tablet TAKE 1 TABLET(50 MG) BY MOUTH TWICE DAILY   Tadalafil 2.5 MG TABS Take 1 tablet (2.5 mg total) by mouth daily.   [DISCONTINUED] amLODipine (NORVASC) 2.5 MG tablet    [DISCONTINUED] cyclobenzaprine (FLEXERIL) 10 MG tablet Take 1 tablet (10 mg total) by mouth at bedtime.   [DISCONTINUED] metoprolol succinate (TOPROL-XL) 50 MG 24 hr tablet Take 100 mg by mouth daily.   amLODipine (NORVASC) 2.5 MG tablet Take 1 tablet (2.5 mg total) by mouth daily.   cyclobenzaprine (FLEXERIL) 10 MG tablet Take 1 tablet (10 mg total) by mouth at bedtime.   metoprolol succinate (TOPROL-XL) 50 MG 24 hr tablet Take 1 tablet (50 mg total) by mouth daily.   [DISCONTINUED] metoprolol tartrate (LOPRESSOR) 25 MG tablet Take 1 tablet (25 mg total) by mouth 2 (two) times daily.   No facility-administered encounter medications on file as of 03/19/2023.    Surgical History: Past Surgical History:  Procedure Laterality Date    ANAL FISTULOTOMY     CARDIAC CATHETERIZATION     CATARACT EXTRACTION Right 02/2022   CATARACT EXTRACTION Left 03/2022   COLONOSCOPY WITH PROPOFOL N/A 02/13/2021   Procedure: COLONOSCOPY WITH PROPOFOL;  Surgeon: Toney Reil, MD;  Location: [facility] ENDOSCOPY;  Service: Gastroenterology;  Laterality: N/A;   CORONARY STENT INTERVENTION N/A 02/24/2017   Procedure: Coronary Stent Intervention;  Surgeon: Alwyn Pea, MD;  Location: [facility] INVASIVE CV LAB;  Service: Cardiovascular;  Laterality: N/A;   ESOPHAGOGASTRODUODENOSCOPY N/A 02/10/2021   Procedure: ESOPHAGOGASTRODUODENOSCOPY (EGD);  Surgeon: Wyline Mood, MD;  Location[facility] END[facility]COPY;  Service: Gastroenterology;  Laterality: N/A;   ESOPHAGOGASTRODUODENOSCOPY (EGD) WITH PROPOFOL N/A 01/25/2021   Procedure: ESOPHAGOGASTRODUODENOSCOPY (EGD) WITH PROPOFOL;  Surgeon: Pasty Spillers, MD;  Location: [facility] ENDOSCOPY;  Service: Endoscopy;  Laterality: N/A;   GIVENS CAPSULE STUDY N/A 02/21/2021   Procedure: GIVENS CAPSULE STUDY;  Surgeon: Pasty Spillers, MD;  Location: [facility] ENDOSCOPY;  Service: Endoscopy;  Laterality: N/A;   LEFT HEART CATH AND CORONARY ANGIOGRAPHY N/A 02/24/2017   Procedure: Left Heart Cath and Coronary Angiography;  Surgeon: Dalia Heading, MD;  Location: [facility] INVASIVE CV LAB;  Service: Cardiovascular;  Laterality: N/A;   TRIGGER FINGER RELEASE Right 10/2021    Medical History: Past Medical History:  Diagnosis Date   Diabetes mellitus without complication (HCC)    Gout    Hyperlipidemia    Hypertension    Myocardial infarction (HCC)    mild   Overactive bladder  [facility]   Trigger finger of all digits of right hand     Family History: Family History  Problem Relation Age of Onset   Diabetes Mother     Social History   Socioeconomic History   Marital status: Married    Spouse name: Not on file   Number of children: Not on file   Years of education: Not on file   Highest  education level: Not on file  Occupational History   Not on file  Tobacco Use   Smoking status: Former   Smokeless tobacco: Never  Vaping Use   Vaping Use: Never used  Substance and Sexual Activity   Alcohol use: Yes    Alcohol/week: 6.0 standard drinks of alcohol    Types: 6 Cans of beer per week    Comment: 1 or 2 with dinner   Drug use: No   Sexual activity: Not on file  Other Topics Concern   Not on file  Social History Narrative   Not on file   Social Determinants of Health   Financial Resource Strain: Not on file  Food Insecurity: Not on file  Transportation Needs: Not on file  Physical Activity: Not on file  Stress: Not on file  Social Connections: Not on file  Intimate Partner Violence: Not on file      Review of Systems  Constitutional:  Negative for appetite change and fatigue.  Respiratory: Negative.  Negative for cough, chest tightness, shortness of breath and wheezing.   Cardiovascular: Negative.  Negative for chest pain and palpitations.  Gastrointestinal:  Positive for abdominal pain (improved). Negative for abdominal distention, blood in stool, constipation, diarrhea, nausea and vomiting.  Psychiatric/Behavioral:  Positive for sleep disturbance.     Vital Signs: BP (!) 140/82   Pulse 89   Temp 98.2 F (36.8 C)   Resp 16   Ht 5' 10.5" (1.791 m)   Wt 204 lb 9.6 oz (92.8 kg)   SpO2 95%   BMI 28.94 kg/m    Physical Exam Vitals reviewed.  Constitutional:      General: He is not in acute distress.    Appearance: Normal appearance. He is obese. He is not ill-appearing.  HENT:     Head: Normocephalic and atraumatic.  Eyes:     Pupils: Pupils are equal, round, and reactive to light.  Cardiovascular:     Rate and Rhythm: Normal rate and regular rhythm.  Pulmonary:     Effort: Pulmonary effort is normal. No respiratory distress.  Neurological:     Mental Status: He is alert and oriented to person, place, and time.  Psychiatric:        Mood  and Affect: Mood normal.        Behavior: Behavior normal.        Assessment/Plan: 1. Type 2 diabetes mellitus with hyperglycemia, without long-term current use of insulin (HCC) A1c is stable at 6.7, continue current medication as prescribed. Repeat A1c in 4 months.  - POCT glycosylated hemoglobin (Hb A1C)  2. Chronic midline low back pain with bilateral sciatica Continue prn cyclobenzaprine as prescribed.  - cyclobenzaprine (FLEXERIL) 10 MG tablet; Take 1 tablet (10 mg total) by mouth at bedtime.  Dispense: 30 tablet; Refill: 2  3. Essential hypertension Continue metoprolol, losartan and amlodipine as prescribed.  - metoprolol succinate (TOPROL-XL) 50 MG 24 hr tablet; Take 1 tablet (50 mg total) by mouth daily.  Dispense: 90 tablet; Refill: 3 - amLODipine (NORVASC) 2.5 MG tablet; Take 1  tablet (2.5 mg total) by mouth daily.  Dispense: 90 tablet; Refill: 1  4. Cholecystitis, acute Scheduled for cholecystectomy in july  5. Aortic atherosclerosis (HCC) LDL is 52, continue atorvastatin as prescribed    General Counseling: Mark Wyatt verbalizes understanding of the findings of todays visit and agrees with plan of treatment. I have discussed any further diagnostic evaluation that may be needed or ordered today. We also reviewed his medications today. he has been encouraged to call the office with any questions or concerns that should arise related to todays visit.    Orders Placed This Encounter  Procedures   POCT glycosylated hemoglobin (Hb A1C)    Meds ordered this encounter  Medications   cyclobenzaprine (FLEXERIL) 10 MG tablet    Sig: Take 1 tablet (10 mg total) by mouth at bedtime.    Dispense:  30 tablet    Refill:  2   metoprolol succinate (TOPROL-XL) 50 MG 24 hr tablet    Sig: Take 1 tablet (50 mg total) by mouth daily.    Dispense:  90 tablet    Refill:  3   amLODipine (NORVASC) 2.5 MG tablet    Sig: Take 1 tablet (2.5 mg total) by mouth daily.    Dispense:  90  tablet    Refill:  1    Return in about 4 months (around 07/20/2023) for F/U, Recheck A1C, Mark Wyatt PCP.   Total time spent:30 Minutes Time spent includes review of chart, medications, test results, and follow up plan with the patient.   [place] Controlled Substance Database was reviewed by me.  This patient was seen by Sallyanne Kuster, FNP-C in collaboration with Dr. Beverely Risen as a part of collaborative care agreement.   Mark Wyatt R. Tedd Sias, MSN, FNP-C Internal medicine

## 2023-04-03 ENCOUNTER — Ambulatory Visit (INDEPENDENT_AMBULATORY_CARE_PROVIDER_SITE_OTHER): Payer: Medicare Other | Admitting: Urology

## 2023-04-03 ENCOUNTER — Encounter: Payer: Self-pay | Admitting: Urology

## 2023-04-03 VITALS — BP 147/86 | HR 59 | Ht 70.0 in | Wt 196.0 lb

## 2023-04-03 DIAGNOSIS — R351 Nocturia: Secondary | ICD-10-CM

## 2023-04-03 DIAGNOSIS — N401 Enlarged prostate with lower urinary tract symptoms: Secondary | ICD-10-CM

## 2023-04-03 LAB — URINALYSIS, COMPLETE
Bilirubin, UA: NEGATIVE
Glucose, UA: NEGATIVE
Ketones, UA: NEGATIVE
Leukocytes,UA: NEGATIVE
Nitrite, UA: NEGATIVE
Protein,UA: NEGATIVE
Specific Gravity, UA: 1.01 (ref 1.005–1.030)
Urobilinogen, Ur: 0.2 mg/dL (ref 0.2–1.0)
pH, UA: 5.5 (ref 5.0–7.5)

## 2023-04-03 LAB — MICROSCOPIC EXAMINATION: Bacteria, UA: NONE SEEN

## 2023-04-03 LAB — BLADDER SCAN AMB NON-IMAGING: Scan Result: 64

## 2023-04-03 MED ORDER — SILODOSIN 8 MG PO CAPS: 8.0000 mg | ORAL_CAPSULE | Freq: Every day | ORAL | 1 refills | Status: DC

## 2023-04-03 NOTE — Progress Notes (Signed)
Marcelle Overlie Plume,acting as a scribe for Riki Altes, MD.,have documented all relevant documentation on the behalf of Riki Altes, MD,as directed by  Riki Altes, MD while in the presence of Riki Altes, MD.  04/03/2023 11:14 AM   [name] May 31, 1951 [id number]  Referring provider: Sallyanne Kuster, NP [address] [place],  [place] [address]  Chief Complaint  Patient presents with   Benign Prostatic Hypertrophy    HPI: Mark Wyatt is a 72 y.o. male who presents today for follow up of BPH.   Initially seen 06/2021 with a 1.5 year history of bothersome urinary tract symptoms; IPSS 21/35. He had seen no improvement on tamsulosin, and the majority of his symptoms were storage related.  He was given a trial of myrbetriq, which was not effective. He had a PA follow up visit and had seen no improvement with the addition of Myrbetriq.  Trospium was started with a recommendation of continuing tamsulosin Cystoscopy 11/2020 showed moderate lateral lobe enlargement He had a follow up visit in 05/2021 and had seen significant improvement in his symptoms with fluid and caffeine restriction, and IPSS at that time was 13/35. Since his last visit, he has had progressively worsening lower urinary tract symptoms, with complaints of frequency, urgency, intermittency, and straining to urinate. He has nocturia x3-4  IPSS today 24/35 Presently, on no GU medications Denies, dysuria or gross hematuria Has had abdominal pain secondary to cholelithiasis and is scheduled for gallbladder surgery next month  PMH: Past Medical History:  Diagnosis Date   Diabetes mellitus without complication (HCC)    Gout    Hyperlipidemia    Hypertension    Myocardial infarction (HCC)    mild   Overactive bladder    [facility]   Trigger finger of all digits of right hand     Surgical History: Past Surgical History:  Procedure Laterality Date   ANAL FISTULOTOMY     CARDIAC  CATHETERIZATION     CATARACT EXTRACTION Right 02/2022   CATARACT EXTRACTION Left 03/2022   COLONOSCOPY WITH PROPOFOL N/A 02/13/2021   Procedure: COLONOSCOPY WITH PROPOFOL;  Surgeon: Toney Reil, MD;  Location: [facility] ENDOSCOPY;  Service: Gastroenterology;  Laterality: N/A;   CORONARY STENT INTERVENTION N/A 02/24/2017   Procedure: Coronary Stent Intervention;  Surgeon: Alwyn Pea, MD;  Location: [facility] INVASIVE CV LAB;  Service: Cardiovascular;  Laterality: N/A;   ESOPHAGOGASTRODUODENOSCOPY N/A 02/10/2021   Procedure: ESOPHAGOGASTRODUODENOSCOPY (EGD);  Surgeon: Wyline Mood, MD;  Location: [facility] ENDOSCOPY;  Service: Gastroenterology;  Laterality: N/A;   ESOPHAGOGASTRODUODENOSCOPY (EGD) WITH PROPOFOL N/A 01/25/2021   Procedure: ESOPHAGOGASTRODUODENOSCOPY (EGD) WITH PROPOFOL;  Surgeon: Pasty Spillers, MD;  Location: [facility] ENDOSCOPY;  Service: Endoscopy;  Laterality: N/A;   GIVENS CAPSULE STUDY N/A 02/21/2021   Procedure: GIVENS CAPSULE STUDY;  Surgeon: Pasty Spillers, MD;  Location: [facility] ENDOSCOPY;  Service: Endoscopy;  Laterality: N/A;   LEFT HEART CATH AND CORONARY ANGIOGRAPHY N/A 02/24/2017   Procedure: Left Heart Cath and Coronary Angiography;  Surgeon: Dalia Heading, MD;  Location: [facility] INVASIVE CV LAB;  Service: Cardiovascular;  Laterality: N/A;   TRIGGER FINGER RELEASE Right 10/2021    Home Medications:  Allergies as of 04/03/2023       Reactions   Penicillins Other (See Comments)   Headache Headache   Aspirin Other (See Comments)   02/05/2020-pt has been taking baby aspirin before bed and tolerating well.        Medication List  Accurate as of April 03, 2023 11:14 AM. If you have any questions, ask your nurse or doctor.          amLODipine 2.5 MG tablet Commonly known as: NORVASC Take 1 tablet (2.5 mg total) by mouth daily.   ASPIRIN 81 PO Take by mouth.   atorvastatin 40 MG tablet Commonly known as: LIPITOR TAKE 1 TABLET(40 MG) BY  MOUTH DAILY AT 6 PM   cyclobenzaprine 10 MG tablet Commonly known as: FLEXERIL Take 1 tablet (10 mg total) by mouth at bedtime.   losartan 50 MG tablet Commonly known as: COZAAR TAKE 1 TABLET(50 MG) BY MOUTH TWICE DAILY   metoprolol succinate 50 MG 24 hr tablet Commonly known as: TOPROL-XL Take 1 tablet (50 mg total) by mouth daily.   silodosin 8 MG Caps capsule Commonly known as: RAPAFLO Take 1 capsule (8 mg total) by mouth daily with breakfast. Started by: Riki Altes, MD   Tadalafil 2.5 MG Tabs Take 1 tablet (2.5 mg total) by mouth daily.        Allergies:  Allergies  Allergen Reactions   Penicillins Other (See Comments)    Headache  Headache   Aspirin Other (See Comments)    02/05/2020-pt has been taking baby aspirin before bed and tolerating well.    Family History: Family History  Problem Relation Age of Onset   Diabetes Mother     Social History:  reports that he has quit smoking. He has never used smokeless tobacco. He reports current alcohol use of about 6.0 standard drinks of alcohol per week. He reports that he does not use drugs.   Physical Exam: BP (!) 147/86   Pulse (!) 59   Ht 5\' 10"  (1.778 m)   Wt 196 lb (88.9 kg)   BMI 28.12 kg/m   Constitutional:  Alert and oriented, No acute distress. HEENT: [place] AT Respiratory: Normal respiratory effort, no increased work of breathing. GI: Abdomen is soft, nontender, nondistended, no abdominal masses Psychiatric: Normal mood and affect.  Assessment & Plan:    1. BPH with LUTS No significant improvement on tamsulosin, myrbetriq, and trospium PVR today 64 mL His most bothersome symptoms are his storage related voiding symptoms and nocturia, and recommend urodynamic study at [facility] Trial of Silodosin 8 mg daily  I have reviewed the above documentation for accuracy and completeness, and I agree with the above.   Riki Altes, MD  [facility] [address]  [address] [place], [place] [address] [phone]

## 2023-04-16 ENCOUNTER — Ambulatory Visit (INDEPENDENT_AMBULATORY_CARE_PROVIDER_SITE_OTHER): Payer: Medicare Other | Admitting: Surgery

## 2023-04-16 ENCOUNTER — Encounter: Payer: Self-pay | Admitting: Surgery

## 2023-04-16 VITALS — BP 157/73 | HR 53 | Temp 98.0°F | Ht 70.0 in | Wt 206.0 lb

## 2023-04-16 DIAGNOSIS — K810 Acute cholecystitis: Secondary | ICD-10-CM

## 2023-04-16 NOTE — Progress Notes (Signed)
04/16/2023  History of Present Illness: Mark Wyatt is a 72 y.o. male presenting for H&P update for upcoming surgery.  He was last seen on 03/03/23 for acute cholecystitis.  He had been diagnosed with ultrasound on 02/24/23 which showed diffuse gallbladder wall thickening to 6 mm.  He was given a course of Augmentin on 03/03/23 which he tolerated well.  He denies any symptoms currently.  He's been adhering to a low fat diet.  Denies any fever, chills, chest pain, shortness of breath.  He has been cleared by cardiology for surgery.  Past Medical History: Past Medical History:  Diagnosis Date   Diabetes mellitus without complication (HCC)    Gout    Hyperlipidemia    Hypertension    Myocardial infarction (HCC)    mild   Overactive bladder    [facility]   Trigger finger of all digits of right hand      Past Surgical History: Past Surgical History:  Procedure Laterality Date   ANAL FISTULOTOMY     CARDIAC CATHETERIZATION     CATARACT EXTRACTION Right 02/2022   CATARACT EXTRACTION Left 03/2022   COLONOSCOPY WITH PROPOFOL N/A 02/13/2021   Procedure: COLONOSCOPY WITH PROPOFOL;  Surgeon: Toney Reil, MD;  Location: [facility] ENDOSCOPY;  Service: Gastroenterology;  Laterality: N/A;   CORONARY STENT INTERVENTION N/A 02/24/2017   Procedure: Coronary Stent Intervention;  Surgeon: Alwyn Pea, MD;  Location: [facility] INVASIVE CV LAB;  Service: Cardiovascular;  Laterality: N/A;   ESOPHAGOGASTRODUODENOSCOPY N/A 02/10/2021   Procedure: ESOPHAGOGASTRODUODENOSCOPY (EGD);  Surgeon: Wyline Mood, MD;  Location: [facility] ENDOSCOPY;  Service: Gastroenterology;  Laterality: N/A;   ESOPHAGOGASTRODUODENOSCOPY (EGD) WITH PROPOFOL N/A 01/25/2021   Procedure: ESOPHAGOGASTRODUODENOSCOPY (EGD) WITH PROPOFOL;  Surgeon: Pasty Spillers, MD;  Location: [facility] ENDOSCOPY;  Service: Endoscopy;  Laterality: N/A;   GIVENS CAPSULE STUDY N/A 02/21/2021   Procedure: GIVENS CAPSULE STUDY;  Surgeon: Pasty Spillers, MD;  Location: [facility] ENDOSCOPY;  Service: Endoscopy;  Laterality: N/A;   LEFT HEART CATH AND CORONARY ANGIOGRAPHY N/A 02/24/2017   Procedure: Left Heart Cath and Coronary Angiography;  Surgeon: Dalia Heading, MD;  Location: [facility] INVASIVE CV LAB;  Service: Cardiovascular;  Laterality: N/A;   TRIGGER FINGER RELEASE Right 10/2021    Home Medications: Prior to Admission medications   Medication Sig Start Date End Date Taking? Authorizing Provider  amLODipine (NORVASC) 2.5 MG tablet Take 1 tablet (2.5 mg total) by mouth daily. 03/19/23  Yes Abernathy, Alyssa, NP  ASPIRIN 81 PO Take by mouth.   Yes [provider]  atorvastatin (LIPITOR) 40 MG tablet TAKE 1 TABLET(40 MG) BY MOUTH DAILY AT 6 PM 02/17/23  Yes Abernathy, Alyssa, NP  cyclobenzaprine (FLEXERIL) 10 MG tablet Take 1 tablet (10 mg total) by mouth at bedtime. 03/19/23  Yes Abernathy, Alyssa, NP  losartan (COZAAR) 50 MG tablet TAKE 1 TABLET(50 MG) BY MOUTH TWICE DAILY 02/25/23  Yes Abernathy, Alyssa, NP  metoprolol succinate (TOPROL-XL) 50 MG 24 hr tablet Take 1 tablet (50 mg total) by mouth daily. 03/19/23  Yes Abernathy, Alyssa, NP  Tadalafil 2.5 MG TABS Take 1 tablet (2.5 mg total) by mouth daily. 09/10/22  Yes Abernathy, Arlyss Repress, NP  silodosin (RAPAFLO) 8 MG CAPS capsule Take 1 capsule (8 mg total) by mouth daily with breakfast. Patient not taking: Reported on 04/16/2023 04/03/23   Riki Altes, MD  metoprolol tartrate (LOPRESSOR) 25 MG tablet Take 1 tablet (25 mg total) by mouth 2 (two) times daily. 02/25/17 09/06/19  Adrian Saran,  MD    Allergies: Allergies  Allergen Reactions   Penicillins Other (See Comments)    Headache  Headache   Aspirin Other (See Comments)    02/05/2020-pt has been taking baby aspirin before bed and tolerating well.    Review of Systems: Review of Systems  Constitutional:  Negative for chills and fever.  Respiratory:  Negative for shortness of breath.   Cardiovascular:  Negative for chest  pain.  Gastrointestinal:  Negative for abdominal pain, nausea and vomiting.    Physical Exam BP (!) 157/73   Pulse (!) 53   Temp 98 F (36.7 C)   Ht 5\' 10"  (1.778 m)   Wt 206 lb (93.4 kg)   SpO2 98%   BMI 29.56 kg/m  CONSTITUTIONAL: No acute distress, well nourished. HEENT:  Normocephalic, atraumatic, extraocular motion intact. RESPIRATORY:  Lungs are clear, and breath sounds are equal bilaterally. Normal respiratory effort without pathologic use of accessory muscles. CARDIOVASCULAR: Heart is regular without murmurs, gallops, or rubs. GI: The abdomen is soft, non-distended, non-tender to palpation.  Negative Murphy's sign.  NEUROLOGIC:  Motor and sensation is grossly normal.  Cranial nerves are grossly intact. PSYCH:  Alert and oriented to person, place and time. Affect is normal.  Laboratory Analysis: Labs from 02/19/2023: Sodium 140, potassium 3.6, chloride 104, CO2 26, BUN 15, creatinine 1.08.  WBC 7.5, hemoglobin 15.3, hematocrit 46.4, platelets 254.   Labs from 02/24/2023: Total bilirubin 1.1, direct bilirubin 0.54, AST 27, ALT 24, alkaline phosphatase 172.   Imaging: CTA chest, abdomen, pelvis on 02/19/2023: IMPRESSION: No acute intrathoracic, abdominal, or pelvic pathology. No aortic aneurysm or dissection.   Ultrasound RUQ on 02/24/2023: IMPRESSION: Diffuse gallbladder wall thickening with positive sonographic Murphy sign is suspicious for cholecystitis.  Assessment and Plan: This is a 73 y.o. male with history of acute cholecystitis.  --Discussed with the patient again the findings on his ultrasound and the rationale for waiting before proceeding with surgery to allow the inflammation to subside.  He's currently scheduled for robotic assisted cholecystectomy on 05/06/23.  He has been asymptomatic recently, adhering to a low fat diet, and has been cleared by cardiology. --Reviewed the surgery at length with him and his wife again, including risks, antibiotic use, that this  would be an outpatient surgery, post-operative activity restrictions, and he's willing to proceed.  All of his questions have been answered. --The patient had to cancel a trip because of his cholecystitis and upcoming surgery.  Have completed insurance form for him.  I spent 20 minutes dedicated to the care of this patient on the date of this encounter to include pre-visit review of records, face-to-face time with the patient discussing diagnosis and management, and any post-visit coordination of care.   Howie Ill, MD [facility]

## 2023-04-16 NOTE — H&P (View-Only) (Signed)
 04/16/2023  History of Present Illness: Zykee Avakian is a 72 y.o. male presenting for H&P update for upcoming surgery.  He was last seen on 03/03/23 for acute cholecystitis.  He had been diagnosed with ultrasound on 02/24/23 which showed diffuse gallbladder wall thickening to 6 mm.  He was given a course of Augmentin on 03/03/23 which he tolerated well.  He denies any symptoms currently.  He's been adhering to a low fat diet.  Denies any fever, chills, chest pain, shortness of breath.  He has been cleared by cardiology for surgery.  Past Medical History: Past Medical History:  Diagnosis Date   Diabetes mellitus without complication (HCC)    Gout    Hyperlipidemia    Hypertension    Myocardial infarction (HCC)    mild   Overactive bladder    [facility]   Trigger finger of all digits of right hand      Past Surgical History: Past Surgical History:  Procedure Laterality Date   ANAL FISTULOTOMY     CARDIAC CATHETERIZATION     CATARACT EXTRACTION Right 02/2022   CATARACT EXTRACTION Left 03/2022   COLONOSCOPY WITH PROPOFOL N/A 02/13/2021   Procedure: COLONOSCOPY WITH PROPOFOL;  Surgeon: Toney Reil, MD;  Location: [facility] ENDOSCOPY;  Service: Gastroenterology;  Laterality: N/A;   CORONARY STENT INTERVENTION N/A 02/24/2017   Procedure: Coronary Stent Intervention;  Surgeon: Alwyn Pea, MD;  Location: [facility] INVASIVE CV LAB;  Service: Cardiovascular;  Laterality: N/A;   ESOPHAGOGASTRODUODENOSCOPY N/A 02/10/2021   Procedure: ESOPHAGOGASTRODUODENOSCOPY (EGD);  Surgeon: Wyline Mood, MD;  Location: [facility] ENDOSCOPY;  Service: Gastroenterology;  Laterality: N/A;   ESOPHAGOGASTRODUODENOSCOPY (EGD) WITH PROPOFOL N/A 01/25/2021   Procedure: ESOPHAGOGASTRODUODENOSCOPY (EGD) WITH PROPOFOL;  Surgeon: Pasty Spillers, MD;  Location: [facility] ENDOSCOPY;  Service: Endoscopy;  Laterality: N/A;   GIVENS CAPSULE STUDY N/A 02/21/2021   Procedure: GIVENS CAPSULE STUDY;  Surgeon: Pasty Spillers, MD;  Location: [facility] ENDOSCOPY;  Service: Endoscopy;  Laterality: N/A;   LEFT HEART CATH AND CORONARY ANGIOGRAPHY N/A 02/24/2017   Procedure: Left Heart Cath and Coronary Angiography;  Surgeon: Dalia Heading, MD;  Location: [facility] INVASIVE CV LAB;  Service: Cardiovascular;  Laterality: N/A;   TRIGGER FINGER RELEASE Right 10/2021    Home Medications: Prior to Admission medications   Medication Sig Start Date End Date Taking? Authorizing Provider  amLODipine (NORVASC) 2.5 MG tablet Take 1 tablet (2.5 mg total) by mouth daily. 03/19/23  Yes Abernathy, Alyssa, NP  ASPIRIN 81 PO Take by mouth.   Yes [provider]  atorvastatin (LIPITOR) 40 MG tablet TAKE 1 TABLET(40 MG) BY MOUTH DAILY AT 6 PM 02/17/23  Yes Abernathy, Alyssa, NP  cyclobenzaprine (FLEXERIL) 10 MG tablet Take 1 tablet (10 mg total) by mouth at bedtime. 03/19/23  Yes Abernathy, Alyssa, NP  losartan (COZAAR) 50 MG tablet TAKE 1 TABLET(50 MG) BY MOUTH TWICE DAILY 02/25/23  Yes Abernathy, Alyssa, NP  metoprolol succinate (TOPROL-XL) 50 MG 24 hr tablet Take 1 tablet (50 mg total) by mouth daily. 03/19/23  Yes Abernathy, Alyssa, NP  Tadalafil 2.5 MG TABS Take 1 tablet (2.5 mg total) by mouth daily. 09/10/22  Yes Abernathy, Arlyss Repress, NP  silodosin (RAPAFLO) 8 MG CAPS capsule Take 1 capsule (8 mg total) by mouth daily with breakfast. Patient not taking: Reported on 04/16/2023 04/03/23   Riki Altes, MD  metoprolol tartrate (LOPRESSOR) 25 MG tablet Take 1 tablet (25 mg total) by mouth 2 (two) times daily. 02/25/17 09/06/19  Adrian Saran,  MD    Allergies: Allergies  Allergen Reactions   Penicillins Other (See Comments)    Headache  Headache   Aspirin Other (See Comments)    02/05/2020-pt has been taking baby aspirin before bed and tolerating well.    Review of Systems: Review of Systems  Constitutional:  Negative for chills and fever.  Respiratory:  Negative for shortness of breath.   Cardiovascular:  Negative for chest  pain.  Gastrointestinal:  Negative for abdominal pain, nausea and vomiting.    Physical Exam BP (!) 157/73   Pulse (!) 53   Temp 98 F (36.7 C)   Ht 5\' 10"  (1.778 m)   Wt 206 lb (93.4 kg)   SpO2 98%   BMI 29.56 kg/m  CONSTITUTIONAL: No acute distress, well nourished. HEENT:  Normocephalic, atraumatic, extraocular motion intact. RESPIRATORY:  Lungs are clear, and breath sounds are equal bilaterally. Normal respiratory effort without pathologic use of accessory muscles. CARDIOVASCULAR: Heart is regular without murmurs, gallops, or rubs. GI: The abdomen is soft, non-distended, non-tender to palpation.  Negative Murphy's sign.  NEUROLOGIC:  Motor and sensation is grossly normal.  Cranial nerves are grossly intact. PSYCH:  Alert and oriented to person, place and time. Affect is normal.  Laboratory Analysis: Labs from 02/19/2023: Sodium 140, potassium 3.6, chloride 104, CO2 26, BUN 15, creatinine 1.08.  WBC 7.5, hemoglobin 15.3, hematocrit 46.4, platelets 254.   Labs from 02/24/2023: Total bilirubin 1.1, direct bilirubin 0.54, AST 27, ALT 24, alkaline phosphatase 172.   Imaging: CTA chest, abdomen, pelvis on 02/19/2023: IMPRESSION: No acute intrathoracic, abdominal, or pelvic pathology. No aortic aneurysm or dissection.   Ultrasound RUQ on 02/24/2023: IMPRESSION: Diffuse gallbladder wall thickening with positive sonographic Murphy sign is suspicious for cholecystitis.  Assessment and Plan: This is a 73 y.o. male with history of acute cholecystitis.  --Discussed with the patient again the findings on his ultrasound and the rationale for waiting before proceeding with surgery to allow the inflammation to subside.  He's currently scheduled for robotic assisted cholecystectomy on 05/06/23.  He has been asymptomatic recently, adhering to a low fat diet, and has been cleared by cardiology. --Reviewed the surgery at length with him and his wife again, including risks, antibiotic use, that this  would be an outpatient surgery, post-operative activity restrictions, and he's willing to proceed.  All of his questions have been answered. --The patient had to cancel a trip because of his cholecystitis and upcoming surgery.  Have completed insurance form for him.  I spent 20 minutes dedicated to the care of this patient on the date of this encounter to include pre-visit review of records, face-to-face time with the patient discussing diagnosis and management, and any post-visit coordination of care.   Howie Ill, MD [facility]

## 2023-04-16 NOTE — Patient Instructions (Addendum)
You will need to stop your Aspirin 5 days before surgery. Your last dose will be on July 10th.  You have requested to have your gallbladder removed. This will be done at  [facility] with Dr. Aleen Campi.  You will most likely be out of work 1-2 weeks for this surgery.  No operating a motorcycle for at least 2 weeks post surgery.   If you have FMLA or disability paperwork that needs filled out you may drop this off at our office or this can be faxed to [phone].  You will return after your post-op appointment with a lifting restriction for approximately 4 more weeks.  You will be able to eat anything you would like to following surgery. But, start by eating a bland diet and advance this as tolerated. The Gallbladder diet is below, please go as closely by this diet as possible prior to surgery to avoid any further attacks.  Please see the (blue)pre-care form that you have been given today.  If you have any questions, please call our office.  Laparoscopic Cholecystectomy Laparoscopic cholecystectomy is surgery to remove the gallbladder. The gallbladder is located in the upper right part of the abdomen, behind the liver. It is a storage sac for bile, which is produced in the liver. Bile aids in the digestion and absorption of fats. Cholecystectomy is often done for inflammation of the gallbladder (cholecystitis). This condition is usually caused by a buildup of gallstones (cholelithiasis) in the gallbladder. Gallstones can block the flow of bile, and that can result in inflammation and pain. In severe cases, emergency surgery may be required. If emergency surgery is not required, you will have time to prepare for the procedure. Laparoscopic surgery is an alternative to open surgery. Laparoscopic surgery has a shorter recovery time. Your common bile duct may also need to be examined during the procedure. If stones are found in the common bile duct, they may be removed. LET [facility] CARE  PROVIDER KNOW ABOUT: Any allergies you have. All medicines you are taking, including vitamins, herbs, eye drops, creams, and over-the-counter medicines. Previous problems you or members of your family have had with the use of anesthetics. Any blood disorders you have. Previous surgeries you have had.  Any medical conditions you have. RISKS AND COMPLICATIONS Generally, this is a safe procedure. However, problems may occur, including: Infection. Bleeding. Allergic reactions to medicines. Damage to other structures or organs. A stone remaining in the common bile duct. A bile leak from the cyst duct that is clipped when your gallbladder is removed. The need to convert to open surgery, which requires a larger incision in the abdomen. This may be necessary if your surgeon thinks that it is not safe to continue with a laparoscopic procedure. BEFORE THE PROCEDURE Ask your health care provider about: Changing or stopping your regular medicines. This is especially important if you are taking diabetes medicines or blood thinners. Taking medicines such as aspirin and ibuprofen. These medicines can thin your blood. Do not take these medicines before your procedure if your health care provider instructs you not to. Follow instructions from your health care provider about eating or drinking restrictions. Let your health care provider know if you develop a cold or an infection before surgery. Plan to have someone take you home after the procedure. Ask your health care provider how your surgical site will be marked or identified. You may be given antibiotic medicine to help prevent infection. PROCEDURE To reduce your risk of infection: Your health  care team will wash or sanitize their hands. Your skin will be washed with soap. An IV tube may be inserted into one of your veins. You will be given a medicine to make you fall asleep (general anesthetic). A breathing tube will be placed in your mouth. The  surgeon will make several small cuts (incisions) in your abdomen. A thin, lighted tube (laparoscope) that has a tiny camera on the end will be inserted through one of the small incisions. The camera on the laparoscope will send a picture to a TV screen (monitor) in the operating room. This will give the surgeon a good view inside your abdomen. A gas will be pumped into your abdomen. This will expand your abdomen to give the surgeon more room to perform the surgery. Other tools that are needed for the procedure will be inserted through the other incisions. The gallbladder will be removed through one of the incisions. After your gallbladder has been removed, the incisions will be closed with stitches (sutures), staples, or skin glue. Your incisions may be covered with a bandage (dressing). The procedure may vary among health care providers and hospitals. AFTER THE PROCEDURE Your blood pressure, heart rate, breathing rate, and blood oxygen level will be monitored often until the medicines you were given have worn off. You will be given medicines as needed to control your pain.   This information is not intended to replace advice given to you by your health care provider. Make sure you discuss any questions you have with your health care provider.   Document Released: 10/07/2005 Document Revised: 06/28/2015 Document Reviewed: 05/19/2013 Elsevier Interactive Patient Education 2016 Else[employer] Inc[employer]   Low-Fat Diet for Gallbladder Conditions A low-fat diet can be helpful if you have pancreatitis or a gallbladder condition. With these conditions, your pancreas and gallbladder have trouble digesting fats. A healthy eating plan with less fat will help rest your pancreas and gallbladder and reduce your symptoms. WHAT DO I NEED TO KNOW ABOUT THIS DIET? Eat a low-fat diet. Reduce your fat intake to less than 20-30% of your total daily calories. This is less than 50-60 g of fat per day. Remember that you need  some fat in your diet. Ask your dietician what your daily goal should be. Choose nonfat and low-fat healthy foods. Look for the words "nonfat," "low fat," or "fat free." As a guide, look on the label and choose foods with less than 3 g of fat per serving. Eat only one serving. Avoid alcohol. Do not smoke. If you need help quitting, talk with your health care provider. Eat small frequent meals instead of three large heavy meals. WHAT FOODS CAN I EAT? Grains Include healthy grains and starches such as potatoes, wheat bread, fiber-rich cereal, and brown rice. Choose whole grain options whenever possible. In adults, whole grains should account for 45-65% of your daily calories.  Fruits and Vegetables Eat plenty of fruits and vegetables. Fresh fruits and vegetables add fiber to your diet. Meats and Other Protein Sources Eat lean meat such as chicken and pork. Trim any fat off of meat before cooking it. Eggs, fish, and beans are other sources of protein. In adults, these foods should account for 10-35% of your daily calories. Dairy Choose low-fat milk and dairy options. Dairy includes fat and protein, as well as calcium.  Fats and Oils Limit high-fat foods such as fried foods, sweets, baked goods, sugary drinks.  Other Creamy sauces and condiments, such as mayonnaise, can add extra fat. Think  about whether or not you need to use them, or use smaller amounts or low fat options. WHAT FOODS ARE NOT RECOMMENDED? High fat foods, such as: [employer]. Ice cream. [place] toast. Sweet rolls. Pizza. Cheese bread. Foods covered with batter, butter, creamy sauces, or cheese. Fried foods. Sugary drinks and desserts. Foods that cause gas or bloating   This information is not intended to replace advice given to you by your health care provider. Make sure you discuss any questions you have with your health care provider.   Document Released: 10/12/2013 Document Reviewed: 10/12/2013 Elsevier Interactive  Patient Education [employer]

## 2023-04-25 ENCOUNTER — Other Ambulatory Visit: Payer: Self-pay

## 2023-04-25 ENCOUNTER — Encounter
Admission: RE | Admit: 2023-04-25 | Discharge: 2023-04-25 | Disposition: A | Discharge status: Home / Self Care | Payer: TRICARE For Life (TFL) | Source: Ambulatory Visit | Attending: Surgery | Admitting: Surgery

## 2023-04-25 DIAGNOSIS — E119 Type 2 diabetes mellitus without complications: Secondary | ICD-10-CM

## 2023-04-25 HISTORY — DX: Angina pectoris, unspecified: I20.9

## 2023-04-25 HISTORY — DX: Anemia, unspecified: D64.9

## 2023-04-25 HISTORY — DX: Atherosclerotic heart disease of native coronary artery without angina pectoris: I25.10

## 2023-04-25 NOTE — Patient Instructions (Addendum)
Your procedure is scheduled on: 05/06/23 - Tuesday Report to the Registration Desk on the 1st floor of the [facility]. To find out your arrival time, please call [phone] between 1PM - 3PM on: 05/05/23 - Monday If your arrival time is 6:00 am, do not arrive before that time as the [facility] entrance doors do not open until 6:00 am.  REMEMBER: Instructions that are not followed completely may result in serious medical risk, up to and including death; or upon the discretion of your surgeon and anesthesiologist your surgery may need to be rescheduled.  Do not eat food after midnight the night before surgery.  No gum chewing or hard candies.  You may water up to 2 hours before you are scheduled to arrive for your surgery. Do not drink anything within 2 hours of your scheduled arrival time.   One week prior to surgery: Stop Anti-inflammatories (NSAIDS) such as Advil, Aleve, Ibuprofen, Motrin, Naproxen, Naprosyn and Aspirin based products such as Excedrin, Goody's Powder, BC Powder. You may take Tylenol if needed for pain up until the day of surgery.  Stop ANY OVER THE COUNTER supplements until after surgery.    TAKE ONLY THESE MEDICATIONS THE MORNING OF SURGERY WITH A SIP OF WATER:  amLODipine (NORVASC)     Stop Aspirin on 04/30/23, resume with doctors instructions.   No Alcohol for 24 hours before or after surgery.  No Smoking including e-cigarettes for 24 hours before surgery.  No chewable tobacco products for at least 6 hours before surgery.  No nicotine patches on the day of surgery.  Do not use any "recreational" drugs for at least a week (preferably 2 weeks) before your surgery.  Please be advised that the combination of cocaine and anesthesia may have negative outcomes, up to and including death. If you test positive for cocaine, your surgery will be cancelled.  On the morning of surgery brush your teeth with toothpaste and water, you may rinse your mouth with  mouthwash if you wish. Do not swallow any toothpaste or mouthwash.  Use CHG Soap or wipes as directed on instruction sheet.  Do not wear jewelry, make-up, hairpins, clips or nail polish.  Do not wear lotions, powders, or perfumes.   Do not shave body hair from the neck down 48 hours before surgery.  Contact lenses, hearing aids and dentures may not be worn into surgery.  Do not bring valuables to the hospital. [facility] is not responsible for any missing/lost belongings or valuables.   Notify your doctor if there is any change in your medical condition (cold, fever, infection).  Wear comfortable clothing (specific to your surgery type) to the hospital.  After surgery, you can help prevent lung complications by doing breathing exercises.  Take deep breaths and cough every 1-2 hours. Your doctor may order a device called an Incentive Spirometer to help you take deep breaths. When coughing or sneezing, hold a pillow firmly against your incision with both hands. This is called "splinting." Doing this helps protect your incision. It also decreases belly discomfort.  If you are being admitted to the hospital overnight, leave your suitcase in the car. After surgery it may be brought to your room.  In case of increased patient census, it may be necessary for you, the patient, to continue your postoperative care in the Same Day Surgery department.  If you are being discharged the day of surgery, you will not be allowed to drive home. You will need a responsible individual  to drive you home and stay with you for 24 hours after surgery.   If you are taking public transportation, you will need to have a responsible individual with you.  Please call the Pre-admissions Testing Dept. at [phone] if you have any questions about these instructions.  Surgery Visitation Policy:  Patients having surgery or a procedure may have two visitors.  Children under the age of 65 must have an adult  with them who is not the patient.  Inpatient Visitation:    Visiting hours are 7 a.m. to 8 p.m. Up to four visitors are allowed at one time in a patient room. The visitors may rotate out with other people during the day.  One visitor age 55 or older may stay with the patient overnight and must be in the room by 8 p.m.    Preparing for Surgery with CHLORHEXIDINE GLUCONATE (CHG) Soap  Chlorhexidine Gluconate (CHG) Soap  o An antiseptic cleaner that kills germs and bonds with the skin to continue killing germs even after washing  o Used for showering the night before surgery and morning of surgery  Before surgery, you can play an important role by reducing the number of germs on your skin.  CHG (Chlorhexidine gluconate) soap is an antiseptic cleanser which kills germs and bonds with the skin to continue killing germs even after washing.  Please do not use if you have an allergy to CHG or antibacterial soaps. If your skin becomes reddened/irritated stop using the CHG.  1. Shower the NIGHT BEFORE SURGERY and the MORNING OF SURGERY with CHG soap.  2. If you choose to wash your hair, wash your hair first as usual with your normal shampoo.  3. After shampooing, rinse your hair and body thoroughly to remove the shampoo.  4. Use CHG as you would any other liquid soap. You can apply CHG directly to the skin and wash gently with a scrungie or a clean washcloth.  5. Apply the CHG soap to your body only from the neck down. Do not use on open wounds or open sores. Avoid contact with your eyes, ears, mouth, and genitals (private parts). Wash face and genitals (private parts) with your normal soap.  6. Wash thoroughly, paying special attention to the area where your surgery will be performed.  7. Thoroughly rinse your body with warm water.  8. Do not shower/wash with your normal soap after using and rinsing off the CHG soap.  9. Pat yourself dry with a clean towel.  10. Wear clean pajamas to  bed the night before surgery.  12. Place clean sheets on your bed the night of your first shower and do not sleep with pets.  13. Shower again with the CHG soap on the day of surgery prior to arriving at the hospital.  14. Do not apply any deodorants/lotions/powders.  15. Please wear clean clothes to the hospital.

## 2023-04-29 ENCOUNTER — Other Ambulatory Visit: Payer: Self-pay | Admitting: Unknown Physician Specialty

## 2023-04-29 DIAGNOSIS — E041 Nontoxic single thyroid nodule: Secondary | ICD-10-CM

## 2023-04-30 ENCOUNTER — Encounter: Payer: Self-pay | Admitting: Surgery

## 2023-04-30 ENCOUNTER — Ambulatory Visit
Admission: RE | Admit: 2023-04-30 | Discharge: 2023-04-30 | Disposition: A | Discharge status: Home / Self Care | Payer: Medicare Other | Source: Ambulatory Visit | Attending: Unknown Physician Specialty | Admitting: Unknown Physician Specialty

## 2023-04-30 DIAGNOSIS — E041 Nontoxic single thyroid nodule: Secondary | ICD-10-CM

## 2023-04-30 NOTE — Progress Notes (Signed)
Perioperative / Anesthesia Services  Pre-Admission Testing Clinical Review / Preoperative Anesthesia Consult  Date: 05/03/23  Patient Demographics:  Name: Mark Wyatt DOB:   June 14, 1951 MRN:   [id number]  Planned Surgical Procedure(s):    Case: [id number] Date/Time: 05/06/23 0715   Procedures:      XI ROBOTIC ASSISTED LAPAROSCOPIC CHOLECYSTECTOMY     INDOCYANINE GREEN FLUORESCENCE IMAGING (ICG)   Anesthesia type: General   Pre-op diagnosis: cholecystitis   Location: [facility] OR ROOM 06 / [facility]   Surgeons: Henrene Dodge, MD     NOTE: Available PAT nursing documentation and vital signs have been reviewed. Clinical nursing staff has updated patient's PMH/PSHx, current medication list, and drug allergies/intolerances to ensure comprehensive history available to assist in medical decision making as it pertains to the aforementioned surgical procedure and anticipated anesthetic course. Extensive review of available clinical information personally performed. [place] [facility] and PSHx updated with any diagnoses/procedures that  may have been inadvertently omitted during his intake with the pre-admission testing department's nursing staff.  Clinical Discussion:  Mark Wyatt is a 72 y.o. male who is submitted for pre-surgical anesthesia review and clearance prior to him undergoing the above procedure. Patient is a Former Games developer. Pertinent PMH includes: CAD, NSTEMI, angina, ventricular bigeminy, aortic atherosclerosis, BILATERAL carotid artery disease, RBBB, HTN, HLD, T2DM, GERD (no daily Tx), anemia, ED (on PDE5i), OAB, cervical/thoracic/lumbar DDD, OA.  Patient is followed by cardiology Darrold Junker, MD). He was last seen in the cardiology clinic on 02/11/2023; notes reviewed. At the time of his clinic visit, patient doing well overall from a cardiovascular perspective. Patient denied any chest pain, shortness of breath, PND, orthopnea, palpitations, significant peripheral edema,  weakness, fatigue, vertiginous symptoms, or presyncope/syncope. Patient with a past medical history significant for cardiovascular diagnoses. Documented physical exam was grossly benign, providing no evidence of acute exacerbation and/or decompensation of the patient's known cardiovascular conditions.  Patient suffered an NSTEMI on 02/21/2017. Troponins were trended: < 0.03 --> 2.98 --> 4.93 ng/mL.   TTE performed on 02/22/2017 revealing low normal left ventricular systolic function with an EF of 50-55%. There was anteroseptal hypokinesis. Diastolic doppler parameter normal. Right ventricular size and function normal. There was trivial mitral and mild tricuspid valve regurgitation. All transvalvular gradients were noted to be normal providing no evidence suggestive of valvular stenosis. Aorta normal in size with no evidence of aneurysmal dilatation.  Patient underwent diagnostic LEFT heart catheterization on 02/24/2017 revealing multivessel CAD; 95% dRCA-1, 60% dRCA-2, 60% OM1, 70% p-mLCx, 70% D1, and 65% pLAD. PCI was subsequently performed placing a 2.5 x 38 mm Xience Alpine DES x 1 to the dRCA-1 and a 2.5 x 38 mm Xience Alpine DES x 1 to the RPDA lesions. Stents did not overlap. Procedure yielded excellent angiographic result and TIMI-3 flow.   Carotid doppler student performed on 09/08/2020 revealed disease in the patient's BILATERAL carotid arteries; 50-70% RICA and < 50% LICA.   Blood pressure well controlled at 114/70 mmHg on currently prescribed CCB (amlodipine), ARB (losartan), and beta blocker (metoprolol succinate) therapies.  Patient is on atorvastatin for his HLD diagnosis and ASCVD prevention. In the setting of known cardiovascular diagnoses it is important to note that patient is on a PED5i (tadalafil) for an erectile dysfunction diagnosis. T2DM well controlled on currently prescribed regimen; last HgbA1c was 6.7 % when checked on 03/19/2023. He does not have an OSAH diagnosis. Patient  maintains an active lifestyle, however has no formal exercise regiment. Patient is able to complete  all of his ADL/IADLs independently without cardiovascular limitation. Per the DASI, patient is able to achieve at least 4 METS of physical activity without experiencing any significant degree of angina/anginal equivalent symptoms. No changes were made to his medication regimen during his visit with cardiology.  Patient scheduled to follow-up with outpatient cardiology in 6 months or sooner if needed.  [name] is scheduled for an XI ROBOTIC ASSISTED LAPAROSCOPIC CHOLECYSTECTOMY on 05/06/2023 with Dr. Ernesto Rutherford, MD.  Given patient's past medical history significant for cardiovascular diagnoses, presurgical cardiac clearance was sought by the PAT team. Per cardiology, "this patient is optimized for surgery and may proceed with the planned procedural course with a LOW risk of significant perioperative cardiovascular complications".  In review of his medication reconciliation, it is noted that patient is currently on prescribed daily antithrombotic therapy. He has been instructed on recommendations for holding his low-dose ASA for 5 days prior to his procedure with plans to restart as soon as postoperative bleeding risk felt to be minimized by his attending surgeon. The patient has been instructed that his last dose of his ASA should be on 04/30/2023.  Patient denies previous perioperative complications with anesthesia in the past. In review of the available records, it is noted that patient underwent a MAC anesthetic course at [facility] (ASA III) in 02/2022 without documented complications.      04/16/2023    8:55 AM 04/03/2023   10:33 AM 03/19/2023    8:32 AM  Vitals with BMI  Height 5\' 10"  5\' 10"  5' 10.5"  Weight 206 lbs 196 lbs 204 lbs 10 oz  BMI 29.56 28.12 28.93  Systolic 157 147 191  Diastolic 73 86 82  Pulse 53 59 89    Providers/Specialists:   NOTE: Primary physician provider listed  below. Patient may have been seen by APP or partner within same practice.   PROVIDER ROLE / SPECIALTY LAST Eligha Bridegroom, MD General Surgery (Surgeon) 04/16/2023  Sallyanne Kuster, NP Primary Care Provider 03/19/2023  Marcina Millard, MD Cardiology 02/11/2023   Allergies:  Penicillins and Aspirin  Current Home Medications:   No current facility-administered medications for this encounter.    amLODipine (NORVASC) 2.5 MG tablet   ASPIRIN 81 PO   atorvastatin (LIPITOR) 40 MG tablet   cyclobenzaprine (FLEXERIL) 10 MG tablet   losartan (COZAAR) 50 MG tablet   metoprolol succinate (TOPROL-XL) 50 MG 24 hr tablet   Tadalafil 2.5 MG TABS   History:   Past Medical History:  Diagnosis Date   Anal fistula    a.) s/p anal fistulotomy   Anemia    Anginal pain (HCC)    Aortic atherosclerosis (HCC)    AVM (arteriovenous malformation) of duodenum    Bilateral carotid artery disease (HCC) 09/08/2020   a.) carotid doppler 09/08/2020: 50-70% RICA, < 50% LICA   Bilateral renal cysts    Coronary artery disease 02/24/2017   a.) LHC/PCI 02/24/2017: 95% dRCA-1 (2.5 x 38 mm Xience Alpine DES), 60% dRCA-2, 75% RPDA (2.5 x 38 mm Xience Alpine DES), 60% OM1, 70% p-mLCx, 70% D1, 65% pLAD   DDD (degenerative disc disease), cervical    DDD (degenerative disc disease), thoracolumbar    Duodenitis    Erectile dysfunction    a.) on PDE5i (tadalafil)   Esophagitis    Gastric polyp    GERD (gastroesophageal reflux disease)    Gout    History of bilateral cataract extraction 2023   Hyperlipidemia    Hypertension    NSTEMI (  non-ST elevated myocardial infarction) (HCC) 02/21/2017   a,) troponins were trended: <0.03 --> 2.98 --> 4.93 ng/mL; b.) LHC/PCI 02/24/2017 --> 2.5 x 38 mm Xience Alpine DES x 2 to dRCA-1 and RPDA (non-overlapping)   Osteoarthritis    Overactive bladder    Perirectal abscess    RBBB (right bundle branch block)    Trigger finger of all digits of right hand    Type 2  diabetes, diet controlled (HCC)    Ventricular bigeminy    Past Surgical History:  Procedure Laterality Date   ANAL FISTULOTOMY     APPENDECTOMY     as a child   CATARACT EXTRACTION Right 02/2022   CATARACT EXTRACTION Left 03/2022   COLONOSCOPY WITH PROPOFOL N/A 02/13/2021   Procedure: COLONOSCOPY WITH PROPOFOL;  Surgeon: Toney Reil, MD;  Location: [facility] ENDOSCOPY;  Service: Gastroenterology;  Laterality: N/A;   CORONARY STENT INTERVENTION N/A 02/24/2017   Procedure: Coronary Stent Intervention;  Surgeon: Alwyn Pea, MD;  Location: [facility] INVASIVE CV LAB;  Service: Cardiovascular;  Laterality: N/A;   ESOPHAGOGASTRODUODENOSCOPY N/A 02/10/2021   Procedure: ESOPHAGOGASTRODUODENOSCOPY (EGD);  Surgeon: Wyline Mood, MD;  Location: [facility] ENDOSCOPY;  Service: Gastroenterology;  Laterality: N/A;   ESOPHAGOGASTRODUODENOSCOPY (EGD) WITH PROPOFOL N/A 01/25/2021   Procedure: ESOPHAGOGASTRODUODENOSCOPY (EGD) WITH PROPOFOL;  Surgeon: Pasty Spillers, MD;  Location: [facility] ENDOSCOPY;  Service: Endoscopy;  Laterality: N/A;   GIVENS CAPSULE STUDY N/A 02/21/2021   Procedure: GIVENS CAPSULE STUDY;  Surgeon: Pasty Spillers, MD;  Location: [facility] ENDOSCOPY;  Service: Endoscopy;  Laterality: N/A;   LEFT HEART CATH AND CORONARY ANGIOGRAPHY N/A 02/24/2017   Procedure: Left Heart Cath and Coronary Angiography;  Surgeon: Dalia Heading, MD;  Location: [facility] INVASIVE CV LAB;  Service: Cardiovascular;  Laterality: N/A;   TONSILLECTOMY     TRIGGER FINGER RELEASE Right 10/2021   Family History  Problem Relation Age of Onset   Diabetes Mother    Social History   Tobacco Use   Smoking status: Former    Passive exposure: Past   Smokeless tobacco: Never  Vaping Use   Vaping status: Never Used  Substance Use Topics   Alcohol use: Yes    Alcohol/week: 6.0 standard drinks of alcohol    Types: 6 Cans of beer per week    Comment: 1 or 2 with dinner   Drug use: No    Pertinent Clinical  Results:  LABS:   Lab Results  Component Value Date   WBC 7.5 02/19/2023   HGB 15.3 02/19/2023   HCT 46.4 02/19/2023   MCV 85.9 02/19/2023   PLT 254 02/19/2023   Lab Results  Component Value Date   NA 140 02/19/2023   K 3.6 02/19/2023   CO2 26 02/19/2023   GLUCOSE 135 (H) 02/19/2023   BUN 15 02/19/2023   CREATININE 1.08 02/19/2023   CALCIUM 9.6 02/19/2023   EGFR 71 08/02/2022   GFRNONAA >60 02/19/2023   Component Date Value Ref Range Status   Specific Gravity, UA 04/03/2023 1.010  1.005 - 1.030 Final   pH, UA 04/03/2023 5.5  5.0 - 7.5 Final   Color, UA 04/03/2023 Yellow  Yellow Final   Appearance Ur 04/03/2023 Clear  Clear Final   Leukocytes,UA 04/03/2023 Negative  Negative Final   Protein,UA 04/03/2023 Negative  Negative/Trace Final   Glucose, UA 04/03/2023 Negative  Negative Final   Ketones, UA 04/03/2023 Negative  Negative Final   RBC, UA 04/03/2023 Trace (A)  Negative Final   Bilirubin, UA 04/03/2023 Negative  Negative Final   Urobilinogen, Ur 04/03/2023 0.2  0.2 - 1.0 mg/dL Final   Nitrite, UA 84/69/6295 Negative  Negative Final   Microscopic Examination 04/03/2023 See below:   Final   Scan Result 04/03/2023 64   Final   WBC, UA 04/03/2023 0-5  0 - 5 /hpf Final   RBC, Urine 04/03/2023 0-2  0 - 2 /hpf Final   Epithelial Cells (non renal) 04/03/2023 0-10  0 - 10 /hpf Final   Bacteria, UA 04/03/2023 None seen  None seen/Few Final    ECG: Date: 02/19/2023 Time ECG obtained: 1755 PM Rate: 77 bpm Rhythm:  Sinus rhythm with frequent PVCs; RBBB Axis (leads I and aVF): Normal Intervals: PR 182 ms. QRS 130 ms. QTc 463 ms. ST segment and T wave changes: No evidence of acute ST segment elevation or depression Comparison: Similar to previous tracing obtained on 02/09/2021   IMAGING / PROCEDURES: US ABDOMEN LIMITED RUQ (LIVER/GB) performed on 02/24/2023 Simple cyst at the upper pole of the right kidney measuring 3.1 cm does not require further dedicated imaging  follow-up. Diffuse gallbladder wall thickening with positive sonographic Murphy sign is suspicious for cholecystitis.  CT ANGIO CHEST/ABD/PEL FOR DISSECTION W AND/OR WO CONTRAST performed on 02/19/2023 No acute intrathoracic, abdominal, or pelvic pathology. No aortic aneurysm or dissection. Bilateral renal cysts measure up to 4.3 cm in the upper pole of the right kidney.   US CAROTID BILATERAL performed on 09/08/2020 RIGHT carotid shows 50 to 70% stenosis.  LEFT carotid shows less than 50% stenosis.  BILATERAL vertebral arteries show antegrade flow  LEFT HEART CATHETERIZATION AND CORONARY ANGIOGRAPHY performed on 02/24/2017 Low normal left ventricular systolic function with an EF of 50-55% Multivessel CAD 95% distal RCA-1 60% distal RCA-2 75% RPDA 60% OM1 70% proximal to mid LCx 70% D1 65% proximal LAD Successful PCI -procedure yielded excellent angiographic result and TIMI-3 flow 2.5 x 38 mm Xience Alpine DES x 1 to the distal RCA-1 lesion 2.5 x 38 mm Xience Alpine DES x 1 to the RPDA lesion   TRANSTHORACIC ECHOCARDIOGRAM performed on 02/22/2017 Low normal left ventricular systolic function with an EF of 50-55% Anteroseptal hypokinesis Normal right ventricular size and systolic function Trivial MR Mild TR Normal gradients; no valvular stenosis No pericardial effusion  Impression and Plan:  [name] has been referred for pre-anesthesia review and clearance prior to him undergoing the planned anesthetic and procedural courses. Available labs, pertinent testing, and imaging results were personally reviewed by me in preparation for upcoming operative/procedural course. [facility] medical record has been updated following extensive record review and patient interview with PAT staff.   This patient has been appropriately cleared by cardiology with an overall LOW risk of experiencing significant perioperative cardiovascular complications. Based on clinical review performed today  (05/03/23), barring any significant acute changes in the patient's overall condition, it is anticipated that he will be able to proceed with the planned surgical intervention. Any acute changes in clinical condition may necessitate his procedure being postponed and/or cancelled. Patient will meet with anesthesia team (MD and/or CRNA) on the day of his procedure for preoperative evaluation/assessment. Questions regarding anesthetic course will be fielded at that time.   Pre-surgical instructions were reviewed with the patient during his PAT appointment, and questions were fielded to satisfaction by PAT clinical staff. He has been instructed on which medications that he will need to hold prior to surgery, as well as the ones that have been deemed safe/appropriate to take on the day of  his procedure. As part of the general education provided by PAT, patient made aware both verbally and in writing, that he would need to abstain from the use of any illegal substances during his perioperative course.  He was advised that failure to follow the provided instructions could necessitate case cancellation or result in serious perioperative complications up to and including death. Patient encouraged to contact PAT and/or his surgeon's office to discuss any questions or concerns that may arise prior to surgery; verbalized understanding.   Quentin Mulling, MSN, APRN, FNP-C, CEN [facility]  Peri-operative Services Nurse Practitioner Phone: [phone] Fax: [phone] 05/03/23 5:52 PM  NOTE: This note has been prepared using Dragon dictation software. Despite my best ability to proofread, there is always the potential that unintentional transcriptional errors may still occur from this process.

## 2023-05-03 ENCOUNTER — Encounter: Payer: Self-pay | Admitting: Surgery

## 2023-05-06 ENCOUNTER — Other Ambulatory Visit: Payer: Self-pay

## 2023-05-06 ENCOUNTER — Ambulatory Visit: Payer: Medicare Other | Admitting: Urgent Care

## 2023-05-06 ENCOUNTER — Ambulatory Visit
Admission: RE | Admit: 2023-05-06 | Discharge: 2023-05-06 | Disposition: A | Discharge status: Home / Self Care | Payer: Medicare Other | Attending: Surgery | Admitting: Surgery

## 2023-05-06 ENCOUNTER — Encounter
Admission: RE | Disposition: A | Discharge status: Home / Self Care | Payer: Self-pay | Source: Home / Self Care | Attending: Surgery

## 2023-05-06 ENCOUNTER — Encounter: Payer: Self-pay | Admitting: Surgery

## 2023-05-06 DIAGNOSIS — K811 Chronic cholecystitis: Secondary | ICD-10-CM | POA: Diagnosis not present

## 2023-05-06 DIAGNOSIS — E119 Type 2 diabetes mellitus without complications: Secondary | ICD-10-CM

## 2023-05-06 DIAGNOSIS — K810 Acute cholecystitis: Secondary | ICD-10-CM | POA: Diagnosis present

## 2023-05-06 HISTORY — DX: Other cervical disc degeneration, unspecified cervical region: M50.30

## 2023-05-06 HISTORY — DX: Rectal abscess: K61.1

## 2023-05-06 HISTORY — DX: Anal fistula, unspecified: K60.30

## 2023-05-06 HISTORY — DX: Atherosclerosis of aorta: I70.0

## 2023-05-06 HISTORY — DX: Other intervertebral disc degeneration, thoracolumbar region: M51.35

## 2023-05-06 HISTORY — DX: Gastro-esophageal reflux disease without esophagitis: K21.9

## 2023-05-06 HISTORY — DX: Cyst of kidney, acquired: N28.1

## 2023-05-06 HISTORY — DX: Esophagitis, unspecified without bleeding: K20.90

## 2023-05-06 HISTORY — DX: Type 2 diabetes mellitus without complications: E11.9

## 2023-05-06 HISTORY — DX: Polyp of stomach and duodenum: K31.7

## 2023-05-06 HISTORY — DX: Anal fistula: K60.3

## 2023-05-06 HISTORY — DX: Unspecified right bundle-branch block: I45.10

## 2023-05-06 HISTORY — DX: Duodenitis without bleeding: K29.80

## 2023-05-06 HISTORY — DX: Unspecified osteoarthritis, unspecified site: M19.90

## 2023-05-06 HISTORY — DX: Angiodysplasia of stomach and duodenum without bleeding: K31.819

## 2023-05-06 HISTORY — DX: Male erectile dysfunction, unspecified: N52.9

## 2023-05-06 HISTORY — DX: Other specified cardiac arrhythmias: I49.8

## 2023-05-06 LAB — GLUCOSE, CAPILLARY
Glucose-Capillary: 120 mg/dL — ABNORMAL HIGH (ref 70–99)
Glucose-Capillary: 214 mg/dL — ABNORMAL HIGH (ref 70–99)

## 2023-05-06 SURGERY — CHOLECYSTECTOMY, ROBOT-ASSISTED, LAPAROSCOPIC
Anesthesia: General

## 2023-05-06 MED ORDER — CEFAZOLIN SODIUM-DEXTROSE 2-4 GM/100ML-% IV SOLN
2.0000 g | INTRAVENOUS | Status: AC
  Administered 2023-05-06: 2 g via INTRAVENOUS

## 2023-05-06 MED ORDER — SUGAMMADEX SODIUM 200 MG/2ML IV SOLN
INTRAVENOUS | Status: DC | PRN
  Administered 2023-05-06: 200 mg via INTRAVENOUS

## 2023-05-06 MED ORDER — CHLORHEXIDINE GLUCONATE CLOTH 2 % EX PADS
6.0000 | MEDICATED_PAD | Freq: Once | CUTANEOUS | Status: AC
  Administered 2023-05-06: 6 via TOPICAL

## 2023-05-06 MED ORDER — DEXMEDETOMIDINE HCL IN NACL 80 MCG/20ML IV SOLN
INTRAVENOUS | Status: DC | PRN
  Administered 2023-05-06 (×2): 8 ug via INTRAVENOUS

## 2023-05-06 MED ORDER — EPHEDRINE 5 MG/ML INJ
INTRAVENOUS | Status: AC
  Filled 2023-05-06: qty 5

## 2023-05-06 MED ORDER — GABAPENTIN 300 MG PO CAPS
ORAL_CAPSULE | ORAL | Status: AC
  Filled 2023-05-06: qty 1

## 2023-05-06 MED ORDER — FENTANYL CITRATE (PF) 100 MCG/2ML IJ SOLN
INTRAMUSCULAR | Status: AC
  Filled 2023-05-06: qty 2

## 2023-05-06 MED ORDER — FAMOTIDINE 20 MG PO TABS
20.0000 mg | ORAL_TABLET | Freq: Once | ORAL | Status: AC
  Administered 2023-05-06: 20 mg via ORAL

## 2023-05-06 MED ORDER — OXYCODONE HCL 5 MG PO TABS
ORAL_TABLET | ORAL | Status: AC
  Filled 2023-05-06: qty 1

## 2023-05-06 MED ORDER — DEXAMETHASONE SODIUM PHOSPHATE 10 MG/ML IJ SOLN
INTRAMUSCULAR | Status: DC | PRN
  Administered 2023-05-06: 10 mg via INTRAVENOUS

## 2023-05-06 MED ORDER — SODIUM CHLORIDE 0.9 % IR SOLN
Status: DC | PRN
  Administered 2023-05-06: 1

## 2023-05-06 MED ORDER — OXYCODONE HCL 5 MG PO TABS
5.0000 mg | ORAL_TABLET | Freq: Once | ORAL | Status: AC | PRN
  Administered 2023-05-06: 5 mg via ORAL

## 2023-05-06 MED ORDER — ONDANSETRON HCL 4 MG/2ML IJ SOLN
INTRAMUSCULAR | Status: DC | PRN
Start: 2023-05-06 — End: 2023-05-06
  Administered 2023-05-06: 4 mg via INTRAVENOUS

## 2023-05-06 MED ORDER — FENTANYL CITRATE (PF) 100 MCG/2ML IJ SOLN
25.0000 ug | INTRAMUSCULAR | Status: DC | PRN
  Administered 2023-05-06 (×2): 50 ug via INTRAVENOUS

## 2023-05-06 MED ORDER — ACETAMINOPHEN 500 MG PO TABS: 1000.0000 mg | ORAL_TABLET | Freq: Four times a day (QID) | ORAL | Status: DC | PRN

## 2023-05-06 MED ORDER — BUPIVACAINE LIPOSOME 1.3 % IJ SUSP
INTRAMUSCULAR | Status: AC
  Filled 2023-05-06: qty 10

## 2023-05-06 MED ORDER — ACETAMINOPHEN 500 MG PO TABS
1000.0000 mg | ORAL_TABLET | ORAL | Status: AC
  Administered 2023-05-06: 1000 mg via ORAL

## 2023-05-06 MED ORDER — PROPOFOL 10 MG/ML IV BOLUS
INTRAVENOUS | Status: DC | PRN
  Administered 2023-05-06: 160 mg via INTRAVENOUS

## 2023-05-06 MED ORDER — OXYCODONE HCL 5 MG/5ML PO SOLN: 5.0000 mg | Freq: Once | ORAL | Status: AC | PRN

## 2023-05-06 MED ORDER — EPHEDRINE SULFATE (PRESSORS) 50 MG/ML IJ SOLN
INTRAMUSCULAR | Status: DC | PRN
  Administered 2023-05-06: 5 mg via INTRAVENOUS

## 2023-05-06 MED ORDER — INSULIN ASPART 100 UNIT/ML IJ SOLN
INTRAMUSCULAR | Status: AC
  Filled 2023-05-06: qty 1

## 2023-05-06 MED ORDER — BUPIVACAINE-EPINEPHRINE (PF) 0.25% -1:200000 IJ SOLN
INTRAMUSCULAR | Status: AC
  Filled 2023-05-06: qty 30

## 2023-05-06 MED ORDER — INDOCYANINE GREEN 25 MG IV SOLR
2.5000 mg | INTRAVENOUS | Status: AC
  Administered 2023-05-06: 2.5 mg via INTRAVENOUS

## 2023-05-06 MED ORDER — FENTANYL CITRATE (PF) 100 MCG/2ML IJ SOLN
INTRAMUSCULAR | Status: DC | PRN
  Administered 2023-05-06 (×4): 50 ug via INTRAVENOUS

## 2023-05-06 MED ORDER — BUPIVACAINE LIPOSOME 1.3 % IJ SUSP: 20.0000 mL | Freq: Once | INTRAMUSCULAR | Status: DC

## 2023-05-06 MED ORDER — HYDRALAZINE HCL 20 MG/ML IJ SOLN
INTRAMUSCULAR | Status: DC | PRN
  Administered 2023-05-06: 10 mg via INTRAVENOUS

## 2023-05-06 MED ORDER — ROCURONIUM BROMIDE 100 MG/10ML IV SOLN
INTRAVENOUS | Status: DC | PRN
  Administered 2023-05-06: 60 mg via INTRAVENOUS
  Administered 2023-05-06: 10 mg via INTRAVENOUS

## 2023-05-06 MED ORDER — BUPIVACAINE LIPOSOME 1.3 % IJ SUSP
INTRAMUSCULAR | Status: DC | PRN
  Administered 2023-05-06: 10 mL

## 2023-05-06 MED ORDER — ORAL CARE MOUTH RINSE: 15.0000 mL | Freq: Once | OROMUCOSAL | Status: AC

## 2023-05-06 MED ORDER — LIDOCAINE HCL (CARDIAC) PF 100 MG/5ML IV SOSY
PREFILLED_SYRINGE | INTRAVENOUS | Status: DC | PRN
  Administered 2023-05-06: 40 mg via INTRAVENOUS

## 2023-05-06 MED ORDER — CHLORHEXIDINE GLUCONATE CLOTH 2 % EX PADS: 6.0000 | MEDICATED_PAD | Freq: Once | CUTANEOUS | Status: DC

## 2023-05-06 MED ORDER — CHLORHEXIDINE GLUCONATE 0.12 % MT SOLN
OROMUCOSAL | Status: AC
  Filled 2023-05-06: qty 15

## 2023-05-06 MED ORDER — IBUPROFEN 600 MG PO TABS: 600.0000 mg | ORAL_TABLET | Freq: Three times a day (TID) | ORAL | 1 refills | Status: DC | PRN

## 2023-05-06 MED ORDER — SODIUM CHLORIDE 0.9 % IV SOLN: INTRAVENOUS | Status: DC

## 2023-05-06 MED ORDER — BUPIVACAINE-EPINEPHRINE (PF) 0.25% -1:200000 IJ SOLN
INTRAMUSCULAR | Status: DC | PRN
  Administered 2023-05-06: 30 mL

## 2023-05-06 MED ORDER — INDOCYANINE GREEN 25 MG IV SOLR
INTRAVENOUS | Status: AC
  Filled 2023-05-06: qty 10

## 2023-05-06 MED ORDER — LIDOCAINE HCL (PF) 2 % IJ SOLN
INTRAMUSCULAR | Status: AC
  Filled 2023-05-06: qty 5

## 2023-05-06 MED ORDER — PROPOFOL 10 MG/ML IV BOLUS
INTRAVENOUS | Status: AC
  Filled 2023-05-06: qty 20

## 2023-05-06 MED ORDER — FAMOTIDINE 20 MG PO TABS
ORAL_TABLET | ORAL | Status: AC
  Filled 2023-05-06: qty 1

## 2023-05-06 MED ORDER — INSULIN ASPART 100 UNIT/ML IJ SOLN
5.0000 [IU] | Freq: Once | INTRAMUSCULAR | Status: AC
  Administered 2023-05-06: 5 [IU] via SUBCUTANEOUS

## 2023-05-06 MED ORDER — CHLORHEXIDINE GLUCONATE 0.12 % MT SOLN
15.0000 mL | Freq: Once | OROMUCOSAL | Status: AC
  Administered 2023-05-06: 15 mL via OROMUCOSAL

## 2023-05-06 MED ORDER — ACETAMINOPHEN 500 MG PO TABS
ORAL_TABLET | ORAL | Status: AC
  Filled 2023-05-06: qty 2

## 2023-05-06 MED ORDER — OXYCODONE HCL 5 MG PO TABS: 5.0000 mg | ORAL_TABLET | ORAL | 0 refills | Status: DC | PRN

## 2023-05-06 MED ORDER — GABAPENTIN 300 MG PO CAPS
300.0000 mg | ORAL_CAPSULE | ORAL | Status: AC
  Administered 2023-05-06: 300 mg via ORAL

## 2023-05-06 MED ORDER — CEFAZOLIN SODIUM-DEXTROSE 2-4 GM/100ML-% IV SOLN
INTRAVENOUS | Status: AC
  Filled 2023-05-06: qty 100

## 2023-05-06 SURGICAL SUPPLY — 49 items
ADH SKN CLS APL DERMABOND .7 (GAUZE/BANDAGES/DRESSINGS) ×1
BAG PRESSURE INF REUSE 1000 (BAG) IMPLANT
CANNULA CAP OBTURATR AIRSEAL 8 (CAP) IMPLANT
CAUTERY HOOK MNPLR 1.6 DVNC XI (INSTRUMENTS) ×1 IMPLANT
CLIP LIGATING HEMO O LOK GREEN (MISCELLANEOUS) ×1 IMPLANT
DERMABOND ADVANCED .7 DNX12 (GAUZE/BANDAGES/DRESSINGS) ×1 IMPLANT
DRAPE ARM DVNC X/XI (DISPOSABLE) ×4 IMPLANT
DRAPE COLUMN DVNC XI (DISPOSABLE) ×1 IMPLANT
ELECT CAUTERY BLADE TIP 2.5 (TIP) ×1
ELECT REM PT RETURN 9FT ADLT (ELECTROSURGICAL) ×1
ELECTRODE CAUTERY BLDE TIP 2.5 (TIP) ×1 IMPLANT
ELECTRODE REM PT RTRN 9FT ADLT (ELECTROSURGICAL) ×1 IMPLANT
FORCEPS BPLR R/ABLATION 8 DVNC (INSTRUMENTS) ×1 IMPLANT
FORCEPS PROGRASP DVNC XI (FORCEP) ×1 IMPLANT
GLOVE SURG SYN 7.0 (GLOVE) ×3
GLOVE SURG SYN 7.0 PF PI (GLOVE) ×2 IMPLANT
GLOVE SURG SYN 7.5 E (GLOVE) ×3
GLOVE SURG SYN 7.5 PF PI (GLOVE) ×2 IMPLANT
GOWN STRL REUS W/ TWL LRG LVL3 (GOWN DISPOSABLE) ×4 IMPLANT
GOWN STRL REUS W/TWL LRG LVL3 (GOWN DISPOSABLE) ×3
IRRIGATOR SUCT 8 DISP DVNC XI (IRRIGATION / IRRIGATOR) IMPLANT
IV NS 1000ML (IV SOLUTION) ×1
IV NS 1000ML BAXH (IV SOLUTION) IMPLANT
KIT PINK PAD W/HEAD ARE REST (MISCELLANEOUS) ×1
KIT PINK PAD W/HEAD ARM REST (MISCELLANEOUS) ×1 IMPLANT
LABEL OR SOLS (LABEL) ×1 IMPLANT
MANIFOLD NEPTUNE II (INSTRUMENTS) ×1 IMPLANT
NDL HYPO 22X1.5 SAFETY MO (MISCELLANEOUS) ×1 IMPLANT
NEEDLE HYPO 22X1.5 SAFETY MO (MISCELLANEOUS) ×1
NS IRRIG 500ML POUR BTL (IV SOLUTION) ×1 IMPLANT
OBTURATOR OPTICAL STND 8 DVNC (TROCAR) ×1
OBTURATOR OPTICALSTD 8 DVNC (TROCAR) ×1 IMPLANT
PACK LAP CHOLECYSTECTOMY (MISCELLANEOUS) ×1 IMPLANT
PENCIL SMOKE EVACUATOR (MISCELLANEOUS) ×1 IMPLANT
SEAL UNIV 5-12 XI (MISCELLANEOUS) ×4 IMPLANT
SET TUBE FILTERED XL AIRSEAL (SET/KITS/TRAYS/PACK) IMPLANT
SET TUBE SMOKE EVAC HIGH FLOW (TUBING) ×1 IMPLANT
SOL ELECTROSURG ANTI STICK (MISCELLANEOUS) ×1
SOLUTION ELECTROSURG ANTI STCK (MISCELLANEOUS) ×1 IMPLANT
SPIKE FLUID TRANSFER (MISCELLANEOUS) ×1 IMPLANT
SPONGE T-LAP 18X18 ~~LOC~~+RFID (SPONGE) IMPLANT
SPONGE T-LAP 4X18 ~~LOC~~+RFID (SPONGE) ×1 IMPLANT
SUT MNCRL AB 4-0 PS2 18 (SUTURE) ×1 IMPLANT
SUT VIC AB 3-0 SH 27 (SUTURE)
SUT VIC AB 3-0 SH 27X BRD (SUTURE) IMPLANT
SUT VICRYL 0 UR6 27IN ABS (SUTURE) ×2 IMPLANT
SYS BAG RETRIEVAL 10MM (BASKET) ×1
SYSTEM BAG RETRIEVAL 10MM (BASKET) ×1 IMPLANT
WATER STERILE IRR 500ML POUR (IV SOLUTION) ×1 IMPLANT

## 2023-05-06 NOTE — Discharge Instructions (Addendum)
Discharge Instructions: 1.  Patient may shower, but do not scrub wounds heavily and dab dry only. 2.  Do not submerge wounds in pool/tub until fully healed. 3.  Do not apply ointments or hydrogen peroxide to the wounds. 4.  May apply ice packs to the wounds for comfort. 5.  May resume your Aspirin on 05/08/23 6.  Do not drive while taking narcotics for pain control.  Prior to driving, make sure you are able to rotate right and left to look at blindspots without significant pain or discomfort. 7.  No heavy lifting or pushing of more than 10-15 lbs for 4 weeks.  AMBULATORY SURGERY  DISCHARGE INSTRUCTIONS   The drugs that you were given will stay in your system until tomorrow so for the next 24 hours you should not:  Drive an automobile Make any legal decisions Drink any alcoholic beverage   You may resume regular meals tomorrow.  Today it is better to start with liquids and gradually work up to solid foods.  You may eat anything you prefer, but it is better to start with liquids, then soup and crackers, and gradually work up to solid foods.   Please notify your doctor immediately if you have any unusual bleeding, trouble breathing, redness and pain at the surgery site, drainage, fever, or pain not relieved by medication.    Additional Instructions:        Please contact your physician with any problems or Same Day Surgery at [phone], Monday through Friday 6 am to 4 pm, or [place] at [facility] number at [phone].

## 2023-05-06 NOTE — Transfer of Care (Signed)
Immediate Anesthesia Transfer of Care Note  Patient: Mark Wyatt  Procedure(s) Performed: XI ROBOTIC ASSISTED LAPAROSCOPIC CHOLECYSTECTOMY INDOCYANINE GREEN FLUORESCENCE IMAGING (ICG)  Patient Location: PACU  Anesthesia Type:General  Level of Consciousness: drowsy and patient cooperative  Airway & Oxygen Therapy: Patient Spontanous Breathing and Patient connected to nasal cannula oxygen  Post-op Assessment: Report given to RN and Patient moving all extremities  Post vital signs: Reviewed and stable  Last Vitals:  Vitals Value Taken Time  BP 131/66 05/06/23 0948  Temp    Pulse 69 05/06/23 0949  Resp 30 05/06/23 0949  SpO2 98 % 05/06/23 0949  Vitals shown include unfiled device data.  Last Pain:  Vitals:   05/06/23 0630  TempSrc: Oral  PainSc: 0-No pain         Complications: No notable events documented.

## 2023-05-06 NOTE — Interval H&P Note (Signed)
History and Physical Interval Note:  05/06/2023 9:53 AM  [name]  has presented today for surgery, with the diagnosis of cholecystitis.  The various methods of treatment have been discussed with the patient and family. After consideration of risks, benefits and other options for treatment, the patient has consented to  Procedure(s): XI ROBOTIC ASSISTED LAPAROSCOPIC CHOLECYSTECTOMY (N/A) INDOCYANINE GREEN FLUORESCENCE IMAGING (ICG) (N/A) as a surgical intervention.  The patient's history has been reviewed, patient examined, no change in status, stable for surgery.  I have reviewed the patient's chart and labs.  Questions were answered to the patient's satisfaction.     Mark Wyatt

## 2023-05-06 NOTE — Anesthesia Preprocedure Evaluation (Signed)
Anesthesia Evaluation  Patient identified by MRN, date of birth, ID band Patient awake    Reviewed: Allergy & Precautions, H&P , NPO status , Patient's Chart, lab work & pertinent test results  History of Anesthesia Complications Negative for: history of anesthetic complications  Airway Mallampati: III  TM Distance: >3 FB Neck ROM: full    Dental  (+) Chipped, Dental Advidsory Given   Pulmonary neg pulmonary ROS, neg sleep apnea, neg COPD, former smoker   Pulmonary exam normal breath sounds clear to auscultation       Cardiovascular hypertension, (-) angina + CAD and + Past MI  Normal cardiovascular exam(-) dysrhythmias  Rhythm:regular Rate:Normal     Neuro/Psych negative neurological ROS  negative psych ROS   GI/Hepatic negative GI ROS, Neg liver ROS,,,  Endo/Other  negative endocrine ROSdiabetes    Renal/GU      Musculoskeletal   Abdominal   Peds  Hematology negative hematology ROS (+)   Anesthesia Other Findings Past Medical History: No date: Anal fistula     Comment:  a.) s/p anal fistulotomy No date: Anemia No date: Anginal pain (HCC) No date: Aortic atherosclerosis (HCC) No date: 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/08/2020: Bilateral carotid artery disease (HCC)     Comment:  a.) carotid doppler 09/08/2020: 50-70% RICA, < 50% LICA No date: Bilateral renal cysts 02/24/2017: Coronary artery disease     Comment:  a.) LHC/PCI 02/24/2017: 95% dRCA-1 (2.5 x 38 mm Xience               Alpine DES), 60% dRCA-2, 75% RPDA (2.5 x 38 mm Xience               Alpine DES), 60% OM1, 70% p-mLCx, 70% D1, 65% pLAD No date: DDD (degenerative disc disease), cervical No date: DDD (degenerative disc disease), thoracolumbar No date: Duodenitis No date: Erectile dysfunction     Comment:  a.) on PDE5i (tadalafil) No date: Esophagitis No date: Gastric polyp No date: GERD (gastroesophageal reflux disease) No  date: Gout 2023: History of bilateral cataract extraction No date: Hyperlipidemia No date: Hypertension 02/21/2017: NSTEMI (non-ST elevated myocardial infarction) (HCC)     Comment:  a,) troponins were trended: <0.03 --> 2.98 --> 4.93               ng/mL; b.) LHC/PCI 02/24/2017 --> 2.5 x 38 mm Xience               Alpine DES x 2 to dRCA-1 and RPDA (non-overlapping) No date: Osteoarthritis No date: Overactive bladder No date: Perirectal abscess No date: RBBB (right bundle branch block) No date: Trigger finger of all digits of right hand No date: Type 2 diabetes, diet controlled (HCC) No date: Ventricular bigeminy  Past Surgical History: No date: ANAL FISTULOTOMY No date: APPENDECTOMY     Comment:  as a child 02/2022: CATARACT EXTRACTION; Right 03/2022: CATARACT EXTRACTION; Left 02/13/2021: COLONOSCOPY WITH PROPOFOL; N/A     Comment:  Procedure: COLONOSCOPY WITH PROPOFOL;  Surgeon: Toney Reil, MD;  Location: [facility] ENDOSCOPY;  Service:               Gastroenterology;  Laterality: N/A; 02/24/2017: CORONARY STENT INTERVENTION; N/A     Comment:  Procedure: Coronary Stent Intervention;  Surgeon:               Alwyn Pea, MD;  Location: [facility] INVASIVE CV LAB;  Service: Cardiovascular;  Laterality: N/A; 02/10/2021: ESOPHAGOGASTRODUODENOSCOPY; N/A     Comment:  Procedure: ESOPHAGOGASTRODUODENOSCOPY (EGD);  Surgeon:               Wyline Mood, MD;  Location: [facility] ENDOSCOPY;  Service:               Gastroenterology;  Laterality: N/A; 01/25/2021: ESOPHAGOGASTRODUODENOSCOPY (EGD) WITH PROPOFOL; N/A     Comment:  Procedure: ESOPHAGOGASTRODUODENOSCOPY (EGD) WITH               PROPOFOL;  Surgeon: Pasty Spillers, MD;  Location:               [facility] ENDOSCOPY;  Service: Endoscopy;  Laterality: N/A; 02/21/2021: GIVENS CAPSULE STUDY; N/A     Comment:  Procedure: GIVENS CAPSULE STUDY;  Surgeon: Pasty Spillers, MD;  Location: [facility]  ENDOSCOPY;  Service:               Endoscopy;  Laterality: N/A; 02/24/2017: LEFT HEART CATH AND CORONARY ANGIOGRAPHY; N/A     Comment:  Procedure: Left Heart Cath and Coronary Angiography;                Surgeon: Dalia Heading, MD;  Location: [facility] INVASIVE CV              LAB;  Service: Cardiovascular;  Laterality: N/A; No date: TONSILLECTOMY 10/2021: TRIGGER FINGER RELEASE; Right  BMI    Body Mass Index: 29.56 kg/m      Reproductive/Obstetrics negative OB ROS                             Anesthesia Physical Anesthesia Plan  ASA: 3  Anesthesia Plan: General ETT   Post-op Pain Management:    Induction: Intravenous  PONV Risk Score and Plan: 3 and Ondansetron, Dexamethasone and Midazolam  Airway Management Planned: Oral ETT  Additional Equipment:   Intra-op Plan:   Post-operative Plan: Extubation in OR  Informed Consent: I have reviewed the patients History and Physical, chart, labs and discussed the procedure including the risks, benefits and alternatives for the proposed anesthesia with the patient or authorized representative who has indicated his/her understanding and acceptance.     Dental Advisory Given  Plan Discussed with: Anesthesiologist, CRNA and Surgeon  Anesthesia Plan Comments: (Patient consented for risks of anesthesia including but not limited to:  - adverse reactions to medications - damage to eyes, teeth, lips or other oral mucosa - nerve damage due to positioning  - sore throat or hoarseness - Damage to heart, brain, nerves, lungs, other parts of body or loss of life  Patient voiced understanding.)       Anesthesia Quick Evaluation

## 2023-05-06 NOTE — Anesthesia Postprocedure Evaluation (Signed)
Anesthesia Post Note  Patient: [name]  Procedure(s) Performed: XI ROBOTIC ASSISTED LAPAROSCOPIC CHOLECYSTECTOMY INDOCYANINE GREEN FLUORESCENCE IMAGING (ICG)  Patient location during evaluation: PACU Anesthesia Type: General Level of consciousness: awake and alert Pain management: pain level controlled Vital Signs Assessment: post-procedure vital signs reviewed and stable Respiratory status: spontaneous breathing, nonlabored ventilation, respiratory function stable and patient connected to nasal cannula oxygen Cardiovascular status: blood pressure returned to baseline and stable Postop Assessment: no apparent nausea or vomiting Anesthetic complications: no  No notable events documented.   Last Vitals:  Vitals:   05/06/23 1029 05/06/23 1040  BP: 117/65 127/80  Pulse: 65 68  Resp: 12 18  Temp: 36.8 C (!) 36.4 C  SpO2: 93% 95%    Last Pain:  Vitals:   05/06/23 1040  TempSrc: Temporal  PainSc: 0-No pain                 Stephanie Coup

## 2023-05-06 NOTE — Anesthesia Procedure Notes (Signed)
Procedure Name: Intubation Date/Time: 05/06/2023 7:35 AM  Performed by: Lily Lovings, CRNAPre-anesthesia Checklist: Patient identified, Patient being monitored, Timeout performed, Emergency Drugs available and Suction available Patient Re-evaluated:Patient Re-evaluated prior to induction Oxygen Delivery Method: Circle system utilized Preoxygenation: Pre-oxygenation with 100% oxygen Induction Type: IV induction Ventilation: Mask ventilation without difficulty Laryngoscope Size: Mac and 3 Grade View: Grade I Tube type: Oral Tube size: 7.5 mm Number of attempts: 1 Airway Equipment and Method: Stylet Placement Confirmation: ETT inserted through vocal cords under direct vision, positive ETCO2 and breath sounds checked- equal and bilateral Secured at: 23 cm Tube secured with: Tape Dental Injury: Teeth and Oropharynx as per pre-operative assessment

## 2023-05-06 NOTE — Op Note (Signed)
  Procedure Date:  05/06/2023  Pre-operative Diagnosis:  Acute cholecystitis  Post-operative Diagnosis: Acute cholecystitis  Procedure:  Robotic assisted cholecystectomy with ICG FireFly cholangiogram  Surgeon:  Howie Ill, MD  Assistant:  Radford Pax, PA-S  Anesthesia:  General endotracheal  Estimated Blood Loss:  20 ml  Specimens:  gallbladder  Complications:  None  Indications for Procedure:  This is a 72 y.o. male with a history of acute cholecystitis, treated conservatively.  Now he presents for interval cholecystectomy.  The benefits, complications, treatment options, and expected outcomes were discussed with the patient. The risks of bleeding, infection, recurrence of symptoms, failure to resolve symptoms, bile duct damage, bile duct leak, retained common bile duct stone, bowel injury, and need for further procedures were all discussed with the patient and he was willing to proceed.  Description of Procedure: The patient was correctly identified in the preoperative area and brought into the operating room.  The patient was placed supine with VTE prophylaxis in place.  Appropriate time-outs were performed.  Anesthesia was induced and the patient was intubated.  Appropriate antibiotics were infused.  The abdomen was prepped and draped in a sterile fashion. An infraumbilical incision was made. A cutdown technique was used to enter the abdominal cavity without injury, and a 12 mm robotic port was inserted.  Pneumoperitoneum was obtained with appropriate opening pressures.  Three 8-mm ports were placed in the mid abdomen at the level of the umbilicus under direct visualization.  The DaVinci platform was docked, camera targeted, and instruments were placed under direct visualization.  The gallbladder was identified.  There were adhesions from the omentum and mesentery to the gallbladder.  The fundus was grasped and retracted cephalad.  Adhesions were lysed bluntly and with  electrocautery. The infundibulum was grasped and retracted laterally, exposing the peritoneum overlying the gallbladder.  This was incised with electrocautery and extended on either side of the gallbladder.  FireFly cholangiogram was then obtained, and we were able to clearly identify the cystic duct and common bile duct.  The cystic duct and cystic artery were carefully dissected with combination of cautery and blunt dissection.  Both were clipped twice proximally and once distally, cutting in between.  The gallbladder was taken from the gallbladder fossa in a retrograde fashion with electrocautery. The gallbladder was placed in an Endocatch bag. The liver bed was inspected and any bleeding was controlled with electrocautery. The right upper quadrant was then inspected again revealing intact clips, no bleeding, and no ductal injury.  The area was thoroughly irrigated.  The 8 mm ports were removed under direct visualization and the 12 mm port was removed.  The Endocatch bag was brought out via the umbilical incision. The fascial opening was closed using 0 vicryl suture.  Local anesthetic was infused in all incisions and the incisions were closed with 4-0 Monocryl.  The wounds were cleaned and sealed with DermaBond.  The patient was emerged from anesthesia and extubated and brought to the recovery room for further management.  The patient tolerated the procedure well and all counts were correct at the end of the case.   Howie Ill, MD

## 2023-05-20 ENCOUNTER — Encounter: Payer: Self-pay | Admitting: Physician Assistant

## 2023-05-20 ENCOUNTER — Ambulatory Visit (INDEPENDENT_AMBULATORY_CARE_PROVIDER_SITE_OTHER): Payer: Medicare Other | Admitting: Physician Assistant

## 2023-05-20 VITALS — BP 160/81 | HR 57 | Temp 98.0°F | Ht 70.0 in | Wt 201.0 lb

## 2023-05-20 DIAGNOSIS — Z09 Encounter for follow-up examination after completed treatment for conditions other than malignant neoplasm: Secondary | ICD-10-CM

## 2023-05-20 DIAGNOSIS — K810 Acute cholecystitis: Secondary | ICD-10-CM

## 2023-05-20 NOTE — Progress Notes (Signed)
[facility] POST-OP OFFICE VISIT  05/20/2023  HPI: Dalbert Diponio is a 72 y.o. male 14 days s/p robotic assisted laparoscopic cholecystectomy for acute cholecystitis with Dr Aleen Campi  He is overall doing well Needed pain medications for about 2-3 days; now pain free No fever, chills, nausea, emesis He had been liberal with his diet; no diarrhea Incisions well healed Ambulating without issue No other complaints   Vital signs: BP (!) 160/81   Pulse (!) 57   Temp 98 F (36.7 C)   Ht 5\' 10"  (1.778 m)   Wt 201 lb (91.2 kg)   SpO2 99%   BMI 28.84 kg/m    Physical Exam: Constitutional: Well appearing male, NAD Abdomen: Soft, non-tender, non-distended, no rebound/guarding Skin: Laparoscopic incisions are healing well, no erythema or drainage   Assessment/Plan: This is a 72 y.o. male 14 days s/p robotic assisted laparoscopic cholecystectomy for acute cholecystitis with Dr Aleen Campi   - Pain control prn  - Reviewed wound care recommendation  - Reviewed lifting restrictions; 4 weeks total  - Reviewed surgical pathology; CC with fibrosis  - He can follow up on as needed basis; He understands to call with questions/concerns  -- Lynden Oxford, PA-C [facility] 05/20/2023, 3:47 PM M-F: 7am - 4pm

## 2023-05-20 NOTE — Patient Instructions (Signed)

## 2023-06-03 ENCOUNTER — Other Ambulatory Visit: Payer: Self-pay | Admitting: Nurse Practitioner

## 2023-06-03 DIAGNOSIS — I10 Essential (primary) hypertension: Secondary | ICD-10-CM

## 2023-06-03 NOTE — Telephone Encounter (Signed)
Please ok to send twice daily

## 2023-06-19 ENCOUNTER — Encounter: Payer: Self-pay | Admitting: Urology

## 2023-07-16 ENCOUNTER — Ambulatory Visit: Payer: Medicare Other | Admitting: Urology

## 2023-07-16 ENCOUNTER — Other Ambulatory Visit: Payer: Self-pay

## 2023-07-16 ENCOUNTER — Encounter: Payer: Self-pay | Admitting: Urology

## 2023-07-16 VITALS — BP 166/79 | HR 63 | Ht 70.0 in | Wt 206.0 lb

## 2023-07-16 DIAGNOSIS — R3915 Urgency of urination: Secondary | ICD-10-CM | POA: Diagnosis not present

## 2023-07-16 DIAGNOSIS — R351 Nocturia: Secondary | ICD-10-CM | POA: Diagnosis not present

## 2023-07-16 DIAGNOSIS — N138 Other obstructive and reflux uropathy: Secondary | ICD-10-CM

## 2023-07-16 DIAGNOSIS — R350 Frequency of micturition: Secondary | ICD-10-CM

## 2023-07-16 DIAGNOSIS — R3914 Feeling of incomplete bladder emptying: Secondary | ICD-10-CM

## 2023-07-16 DIAGNOSIS — R399 Unspecified symptoms and signs involving the genitourinary system: Secondary | ICD-10-CM

## 2023-07-16 NOTE — Progress Notes (Signed)
Surgical Physician Order Form [facility] [place]  Dr. Legrand Rams, MD  * Scheduling expectation : Patient prefers December or January  *Length of Case: 2 hours  *Clearance needed: no  *Anticoagulation Instructions: Hold all anticoagulants  *Aspirin Instructions: Hold Aspirin  *Post-op visit Date/Instructions:  1-3 day cath removal  *Diagnosis: BPH w/urinary obstruction  *Procedure:  HOLEP (21308)   Additional orders: N/A  -Admit type: OUTpatient  -Anesthesia: General  -VTE Prophylaxis Standing Order SCD's       Other:   -Standing Lab Orders Per Anesthesia    Lab other: UA&Urine Culture  -Standing Test orders EKG/Chest x-ray per Anesthesia       Test other:   - Medications:  Ancef 2gm IV  -Other orders:  N/A

## 2023-07-16 NOTE — Patient Instructions (Signed)

## 2023-07-16 NOTE — Progress Notes (Signed)
07/16/2023 12:50 PM   Mark Wyatt 10/12/1951 [id number]  Reason for visit: Lower urinary tract symptoms, BPH/OAB, review urodynamics and discuss HOLEP  HPI: 72 year old male with multiyear history of worsening urinary symptoms with urgency, frequency, sensation of incomplete emptying, and nocturia.  He has been followed by Dr. Lonna Cobb.  He has had no improvement with medications previously including Flomax, Myrbetriq, or silodosin.  PVRs have been normal.  Cystoscopy has shown obstructive lateral lobes, and prostate measured 62 g on CT from May 2024, I personally viewed and interpreted those images.  PSA was normal at 1.8 in October 2021, and screening was discontinued at that time per the guideline recommendations.  With his mixed voiding and storage symptoms, Dr. Lonna Cobb recommended urodynamics in [place].  I reviewed the urodynamics showing an obstructed appearing pattern with max capacity of 520 mL, for sensation at 228 mL, no instability noted, voluntary contraction and voided 188 mL with a max flow of 2 mL/s, max detrusor pressure of 13 cm of water, straining noted, PVR .   He again reports primarily urgency and frequency during the day, and nocturia 5-8 times overnight.  He remains extremely bothered by his symptoms.  We reviewed his urodynamics that appear to show an obstructed picture, and I do think he would benefit from an outlet procedure.  We reviewed potential for prolonged healing with his significant storage symptoms, and even potentially need for additional overactive bladder medications in the future.  We reviewed outlet procedures like UroLift or HOLEP, I think he would benefit from a more definitive procedure like HOLEP with his severe symptoms.  We discussed the risks and benefits of HoLEP at length.  The procedure requires general anesthesia and takes 1 to 2 hours, and a holmium laser is used to enucleate the prostate and push this tissue into the bladder.  A  morcellator is then used to remove this tissue, which is sent for pathology.  The vast majority(>95%) of patients are able to discharge the same day with a catheter in place for 2 to 3 days, and will follow-up in clinic for a voiding trial.  We specifically discussed the risks of bleeding, infection, retrograde ejaculation, temporary urgency and urge incontinence, very low risk of long-term incontinence, urethral stricture/bladder neck contracture, pathologic evaluation of prostate tissue and possible detection of prostate cancer or other malignancy, and possible need for additional procedures.  Schedule HOLEP, patient prefers January 2025  Sondra Come, MD  [facility] [address], [address] [place], [place] [address] [phone]

## 2023-07-21 ENCOUNTER — Telehealth: Payer: Self-pay

## 2023-07-21 NOTE — Progress Notes (Signed)
  Phone Number: [phone] for Surgical Coordinator Fax Number: [phone]  REQUEST FOR SURGICAL CLEARANCE       Date: Date: 07/21/23  Faxed to: Dr. Darrold Junker, MD  Surgeon: Dr. Legrand Rams, MD     Date of Surgery: 10/31/2023  Operation: Holmium Laser Enucleation of the Prostate   Anesthesia Type: General   Diagnosis: Benign Prostatic Hyperplasia with Urinary Obstruction  Patient Requires:   Cardiac / Vascular Clearance : Yes  Reason: Patient currently on Aspirin, will need to hold prior to surgery\  Risk Assessment:    Low   []       Moderate   []     High   []           This patient is optimized for surgery  YES []       NO   []    I recommend further assessment/workup prior to surgery. YES []      NO  []   Appointment scheduled for: _______________________   Further recommendations: ____________________________________     Physician Signature:__________________________________   Printed Name: ________________________________________   Date: _________________

## 2023-07-21 NOTE — Progress Notes (Signed)
    [facility] Posting Form  Surgery Date: Date: 10/31/2023  Surgeon: Dr. Legrand Rams, MD  Inpt ( No  )   Outpt (Yes)   Obs ( No  )   Diagnosis: N40.1, N13.8 Benign Prostatic Hyperplasia with Urinary Obstruction  -CPT: 16109  Surgery: Holmium Laser Enucleation of the Prostate   Stop Anticoagulations: Yes and also hold ASA  Cardiac/Medical/Pulmonary Clearance needed: yes  Clearance needed from Dr: Darrold Junker  Clearance request sent on: Date: 07/21/23  *Orders entered into EPIC  Date: 07/21/23   *Case booked in EPIC  Date: 07/21/23  *Notified pt of Surgery: Date: 07/21/23  PRE-OP UA & CX: yes, will obtain in clinic on 10/23/2023  *Placed into Prior Authorization Work Angela Nevin Date: 07/21/23  Assistant/laser/rep:No

## 2023-07-21 NOTE — Telephone Encounter (Signed)
Per Dr. Richardo Hanks, Patient is to be scheduled for Holmium Laser Enucleation of the Prostate   Mr. Kattner was contacted and possible surgical dates were discussed, Friday January 10th, 2025 was agreed upon for surgery.   Patient was instructed that Dr. Richardo Hanks, MD will require them to provide a pre-op UA & CX prior to surgery. This was ordered and scheduled drop off appointment was made for 10/23/2023.    Patient was directed to call [phone] between 1-3pm the day before surgery to find out surgical arrival time.  Instructions were given not to eat or drink from midnight on the night before surgery and have a driver for the day of surgery. On the surgery day patient was instructed to enter through the [facility] entrance of [facility] report the Same Day Surgery desk.   Pre-Admit Testing will be in contact via phone to set up an interview with the anesthesia team to review your history and medications prior to surgery.   Reminder of this information was sent via MyChart/MAILED to the patient.   Patient is to hold anticoag's per Dr. Richardo Hanks.  Currently taking 81mg  ASA by Dr. Darrold Junker Clearance requested to stop 5days prior to surgery, clearance form was faxed to [facility] Cards. Awaiting response

## 2023-07-23 ENCOUNTER — Encounter: Payer: Self-pay | Admitting: Nurse Practitioner

## 2023-07-23 ENCOUNTER — Ambulatory Visit: Payer: Medicare Other | Admitting: Nurse Practitioner

## 2023-07-23 VITALS — BP 134/74 | HR 60 | Temp 98.2°F | Resp 16 | Ht 70.0 in | Wt 210.8 lb

## 2023-07-23 DIAGNOSIS — Z23 Encounter for immunization: Secondary | ICD-10-CM | POA: Diagnosis not present

## 2023-07-23 DIAGNOSIS — E1165 Type 2 diabetes mellitus with hyperglycemia: Secondary | ICD-10-CM

## 2023-07-23 DIAGNOSIS — I152 Hypertension secondary to endocrine disorders: Secondary | ICD-10-CM | POA: Diagnosis not present

## 2023-07-23 DIAGNOSIS — I10 Essential (primary) hypertension: Secondary | ICD-10-CM

## 2023-07-23 DIAGNOSIS — E1169 Type 2 diabetes mellitus with other specified complication: Secondary | ICD-10-CM

## 2023-07-23 DIAGNOSIS — N5201 Erectile dysfunction due to arterial insufficiency: Secondary | ICD-10-CM

## 2023-07-23 DIAGNOSIS — I700 Atherosclerosis of aorta: Secondary | ICD-10-CM | POA: Diagnosis not present

## 2023-07-23 DIAGNOSIS — E1159 Type 2 diabetes mellitus with other circulatory complications: Secondary | ICD-10-CM

## 2023-07-23 LAB — POCT GLYCOSYLATED HEMOGLOBIN (HGB A1C): Hemoglobin A1C: 6.6 % — AB (ref 4.0–5.6)

## 2023-07-23 MED ORDER — TETANUS-DIPHTH-ACELL PERTUSSIS 5-2.5-18.5 LF-MCG/0.5 IM SUSP
0.5000 mL | Freq: Once | INTRAMUSCULAR | 0 refills | Status: AC
Start: 2023-07-23 — End: 2023-07-23

## 2023-07-23 MED ORDER — ZOSTER VAC RECOMB ADJUVANTED 50 MCG/0.5ML IM SUSR
0.5000 mL | Freq: Once | INTRAMUSCULAR | 0 refills | Status: AC
Start: 2023-07-23 — End: 2023-07-23

## 2023-07-23 NOTE — Progress Notes (Signed)
[facility] [address] [place], [place] [address]  Internal MEDICINE  Office Visit Note  Patient Name: Mark Wyatt  [id number]  [id number]  Date of Service: 07/23/2023  Chief Complaint  Patient presents with   Diabetes   Gastroesophageal Reflux   Hyperlipidemia   Hypertension    HPI Mark Wyatt presents for a follow-up visit for diabetes, hypertension, hyperlipidemia, and recent cholecystectomy.  Diabetes -- A1c decreased by 0.1 to 6.6 today. Have started walking 2-4 miles every other day if it is not raining.  Hypertension -- taking amlodipine, losartan and metoprolol and BP is stable.  Hyperlipidemia -- cholesterol is at target of less than 55 LDL with atorvastatin 40 mg daily. Most recent lab -- LDL is 52 S/p cholecystectomy -- surgery went well. Having some issues with diarrhea but improved when compared    Current Medication: Outpatient Encounter Medications as of 07/23/2023  Medication Sig   acetaminophen (TYLENOL) 500 MG tablet Take 2 tablets (1,000 mg total) by mouth every 6 (six) hours as needed for mild pain.   amLODipine (NORVASC) 2.5 MG tablet Take 1 tablet (2.5 mg total) by mouth daily.   ASPIRIN 81 PO Take by mouth.   atorvastatin (LIPITOR) 40 MG tablet TAKE 1 TABLET(40 MG) BY MOUTH DAILY AT 6 PM   cyclobenzaprine (FLEXERIL) 10 MG tablet Take 1 tablet (10 mg total) by mouth at bedtime.   ibuprofen (ADVIL) 600 MG tablet Take 1 tablet (600 mg total) by mouth every 8 (eight) hours as needed for moderate pain.   losartan (COZAAR) 50 MG tablet TAKE 1 TABLET(50 MG) BY MOUTH TWICE DAILY   metoprolol succinate (TOPROL-XL) 50 MG 24 hr tablet Take 1 tablet (50 mg total) by mouth daily.   Tadalafil 2.5 MG TABS Take 1 tablet (2.5 mg total) by mouth daily. (Patient taking differently: Take 2.5 mg by mouth as needed.)   [DISCONTINUED] Tdap (BOOSTRIX) 5-2.5-18.5 LF-MCG/0.5 injection Inject 0.5 mLs into the muscle once.   [DISCONTINUED] Zoster Vaccine Adjuvanted  [facility]) injection Inject 0.5 mLs into the muscle once.   Tdap (BOOSTRIX) 5-2.5-18.5 LF-MCG/0.5 injection Inject 0.5 mLs into the muscle once for 1 dose.   Zoster Vaccine Adjuvanted [facility]) injection Inject 0.5 mLs into the muscle once for 1 dose.   [DISCONTINUED] metoprolol tartrate (LOPRESSOR) 25 MG tablet Take 1 tablet (25 mg total) by mouth 2 (two) times daily.   No facility-administered encounter medications on file as of 07/23/2023.    Surgical History: Past Surgical History:  Procedure Laterality Date   ANAL FISTULOTOMY     APPENDECTOMY     as a child   CATARACT EXTRACTION Right 02/2022   CATARACT EXTRACTION Left 03/2022   COLONOSCOPY WITH PROPOFOL N/A 02/13/2021   Procedure: COLONOSCOPY WITH PROPOFOL;  Surgeon: Toney Reil, MD;  Location: [facility] ENDOSCOPY;  Service: Gastroenterology;  Laterality: N/A;   CORONARY STENT INTERVENTION N/A 02/24/2017   Procedure: Coronary Stent Intervention;  Surgeon: Alwyn Pea, MD;  Location: [facility] INVASIVE CV LAB;  Service: Cardiovascular;  Laterality: N/A;   ESOPHAGOGASTRODUODENOSCOPY N/A 02/10/2021   Procedure: ESOPHAGOGASTRODUODENOSCOPY (EGD);  Surgeon: Wyline Mood, MD;  Location: [facility] ENDOSCOPY;  Service: Gastroenterology;  Laterality: N/A;   ESOPHAGOGASTRODUODENOSCOPY (EGD) WITH PROPOFOL N/A 01/25/2021   Procedure: ESOPHAGOGASTRODUODENOSCOPY (EGD) WITH PROPOFOL;  Surgeon: Pasty Spillers, MD;  Location: [facility] ENDOSCOPY;  Service: Endoscopy;  Laterality: N/A;   GIVENS CAPSULE STUDY N/A 02/21/2021   Procedure: GIVENS CAPSULE STUDY;  Surgeon: Pasty Spillers, MD;  Location: [facility] ENDOSCOPY;  Service: Endoscopy;  Laterality: N/A;   LEFT HEART CATH AND CORONARY ANGIOGRAPHY N/A 02/24/2017   Procedure: Left Heart Cath and Coronary Angiography;  Surgeon: Dalia Heading, MD;  Location: [facility] INVASIVE CV LAB;  Service: Cardiovascular;  Laterality: N/A;   TONSILLECTOMY     TRIGGER FINGER RELEASE Right 10/2021    Medical  History: Past Medical History:  Diagnosis Date   Anal fistula    a.) s/p anal fistulotomy   Anemia    Anginal pain (HCC)    Aortic atherosclerosis (HCC)    AVM (arteriovenous malformation) of duodenum    Bilateral carotid artery disease (HCC) 09/08/2020   a.) carotid doppler 09/08/2020: 50-70% RICA, < 50% LICA   Bilateral renal cysts    Coronary artery disease 02/24/2017   a.) LHC/PCI 02/24/2017: 95% dRCA-1 (2.5 x 38 mm Xience Alpine DES), 60% dRCA-2, 75% RPDA (2.5 x 38 mm Xience Alpine DES), 60% OM1, 70% p-mLCx, 70% D1, 65% pLAD   DDD (degenerative disc disease), cervical    DDD (degenerative disc disease), thoracolumbar    Duodenitis    Erectile dysfunction    a.) on PDE5i (tadalafil)   Esophagitis    Gastric polyp    GERD (gastroesophageal reflux disease)    Gout    History of bilateral cataract extraction 2023   Hyperlipidemia    Hypertension    NSTEMI (non-ST elevated myocardial infarction) (HCC) 02/21/2017   a,) troponins were trended: <0.03 --> 2.98 --> 4.93 ng/mL; b.) LHC/PCI 02/24/2017 --> 2.5 x 38 mm Xience Alpine DES x 2 to dRCA-1 and RPDA (non-overlapping)   Osteoarthritis    Overactive bladder    Perirectal abscess    RBBB (right bundle branch block)    Trigger finger of all digits of right hand    Type 2 diabetes, diet controlled (HCC)    Ventricular bigeminy     Family History: Family History  Problem Relation Age of Onset   Diabetes Mother     Social History   Socioeconomic History   Marital status: Married    Spouse name: Mark Wyatt   Number of children: Not on file   Years of education: Not on file   Highest education level: Not on file  Occupational History   Not on file  Tobacco Use   Smoking status: Former    Passive exposure: Past   Smokeless tobacco: Never  Vaping Use   Vaping status: Never Used  Substance and Sexual Activity   Alcohol use: Yes    Alcohol/week: 6.0 standard drinks of alcohol    Types: 6 Cans of beer per week    Comment:  1 or 2 with dinner   Drug use: No   Sexual activity: Not on file  Other Topics Concern   Not on file  Social History Narrative   Not on file   Social Determinants of Health   Financial Resource Strain: Not on file  Food Insecurity: Not on file  Transportation Needs: Not on file  Physical Activity: Not on file  Stress: Not on file  Social Connections: Unknown (02/18/2022)   Received from [facility]   Social Network    Social Network: Not on file  Intimate Partner Violence: Unknown (01/22/2022)   Received from [facility]   HITS    Physically Hurt: Not on file    Insult or Talk Down To: Not on file    Threaten Physical Harm: Not on file    Scream or Curse: Not on file  Review of Systems  Constitutional:  Negative for appetite change and fatigue.  Respiratory: Negative.  Negative for cough, chest tightness, shortness of breath and wheezing.   Cardiovascular: Negative.  Negative for chest pain and palpitations.  Gastrointestinal:  Positive for abdominal pain (improved). Negative for abdominal distention, blood in stool, constipation, diarrhea, nausea and vomiting.  Psychiatric/Behavioral:  Positive for sleep disturbance.     Vital Signs: BP 134/74 Comment: 150/84  Pulse 60   Temp 98.2 F (36.8 C)   Resp 16   Ht 5\' 10"  (1.778 m)   Wt 210 lb 12.8 oz (95.6 kg)   SpO2 97%   BMI 30.25 kg/m    Physical Exam Vitals reviewed.  Constitutional:      General: He is not in acute distress.    Appearance: Normal appearance. He is obese. He is not ill-appearing.  HENT:     Head: Normocephalic and atraumatic.  Eyes:     Pupils: Pupils are equal, round, and reactive to light.  Cardiovascular:     Rate and Rhythm: Normal rate and regular rhythm.  Pulmonary:     Effort: Pulmonary effort is normal. No respiratory distress.  Neurological:     Mental Status: He is alert and oriented to person, place, and time.  Psychiatric:        Mood and  Affect: Mood normal.        Behavior: Behavior normal.        Assessment/Plan: 1. Type 2 diabetes mellitus with other specified complication, without long-term current use of insulin (HCC) Slightly improved, stable at this time, continue medications as prescribed.  - POCT glycosylated hemoglobin (Hb A1C)  2. Hypertension associated with diabetes (HCC) Continue losartan, metoprolol and amlodipine as prescribed.   3. Aortic atherosclerosis (HCC) Continue atorvastatin as prescribed.   4. Need for vaccination Flu shot administered today. Tdap and shingles vaccine orders sent to pharmacy - Influenza, MDCK, trivalent, PF(Flucelvax egg-free) - Tdap (BOOSTRIX) 5-2.5-18.5 LF-MCG/0.5 injection; Inject 0.5 mLs into the muscle once for 1 dose.  Dispense: 0.5 mL; Refill: 0 - Zoster Vaccine Adjuvanted [facility]) injection; Inject 0.5 mLs into the muscle once for 1 dose.  Dispense: 0.5 mL; Refill: 0   General Counseling: Adden verbalizes understanding of the findings of todays visit and agrees with plan of treatment. I have discussed any further diagnostic evaluation that may be needed or ordered today. We also reviewed his medications today. he has been encouraged to call the office with any questions or concerns that should arise related to todays visit.    Orders Placed This Encounter  Procedures   Influenza, MDCK, trivalent, PF(Flucelvax egg-free)   POCT glycosylated hemoglobin (Hb A1C)    Meds ordered this encounter  Medications   Tdap (BOOSTRIX) 5-2.5-18.5 LF-MCG/0.5 injection    Sig: Inject 0.5 mLs into the muscle once for 1 dose.    Dispense:  0.5 mL    Refill:  0   Zoster Vaccine Adjuvanted [facility]) injection    Sig: Inject 0.5 mLs into the muscle once for 1 dose.    Dispense:  0.5 mL    Refill:  0    Return in about 4 months (around 11/23/2023) for F/U, Recheck A1C, Hye Trawick PCP.   Total time spent:30 Minutes Time spent includes review of chart, medications, test  results, and follow up plan with the patient.   [place] Controlled Substance Database was reviewed by me.  This patient was seen by Sallyanne Kuster, FNP-C in collaboration with Dr. Beverely Risen as  a part of collaborative care agreement.   Jessly Lebeck R. Tedd Sias, MSN, FNP-C Internal medicine

## 2023-08-20 ENCOUNTER — Ambulatory Visit: Payer: Medicare Other | Admitting: Nurse Practitioner

## 2023-09-09 ENCOUNTER — Telehealth: Payer: Self-pay | Admitting: Nurse Practitioner

## 2023-09-09 NOTE — Telephone Encounter (Signed)
Left vm and sent mychart message to confirm 09/16/23 appointment-Toni

## 2023-09-11 ENCOUNTER — Encounter: Payer: Self-pay | Admitting: Nurse Practitioner

## 2023-09-16 ENCOUNTER — Ambulatory Visit: Payer: Medicare Other | Admitting: Nurse Practitioner

## 2023-09-16 ENCOUNTER — Encounter: Payer: Self-pay | Admitting: Nurse Practitioner

## 2023-09-16 VITALS — BP 135/70 | HR 64 | Temp 98.6°F | Resp 16 | Ht 70.0 in | Wt 205.4 lb

## 2023-09-16 DIAGNOSIS — Z23 Encounter for immunization: Secondary | ICD-10-CM | POA: Diagnosis not present

## 2023-09-16 DIAGNOSIS — I152 Hypertension secondary to endocrine disorders: Secondary | ICD-10-CM

## 2023-09-16 DIAGNOSIS — Z0000 Encounter for general adult medical examination without abnormal findings: Secondary | ICD-10-CM | POA: Diagnosis not present

## 2023-09-16 DIAGNOSIS — E1159 Type 2 diabetes mellitus with other circulatory complications: Secondary | ICD-10-CM | POA: Diagnosis not present

## 2023-09-16 DIAGNOSIS — I700 Atherosclerosis of aorta: Secondary | ICD-10-CM

## 2023-09-16 MED ORDER — ZOSTER VAC RECOMB ADJUVANTED 50 MCG/0.5ML IM SUSR
0.5000 mL | Freq: Once | INTRAMUSCULAR | 1 refills | Status: AC
Start: 2023-09-16 — End: 2023-09-16

## 2023-09-16 MED ORDER — AMLODIPINE BESYLATE 2.5 MG PO TABS
2.5000 mg | ORAL_TABLET | Freq: Every day | ORAL | 1 refills | Status: DC
Start: 2023-09-16 — End: 2024-01-12

## 2023-09-16 MED ORDER — TETANUS-DIPHTH-ACELL PERTUSSIS 5-2.5-18.5 LF-MCG/0.5 IM SUSP
0.5000 mL | Freq: Once | INTRAMUSCULAR | 0 refills | Status: AC
Start: 2023-09-16 — End: 2023-09-16

## 2023-09-16 NOTE — Progress Notes (Signed)
[facility] [address] [place], [place] [address]  Internal MEDICINE  Office Visit Note  Patient Name: Mark Wyatt  [id number]  [id number]  Date of Service: 09/16/2023  Chief Complaint  Patient presents with   Diabetes   Gastroesophageal Reflux   Hyperlipidemia   Hypertension   Medicare Wellness    HPI Sol presents for an annual well visit and physical exam.  Well-appearing 72 y.o. male with hypertension, CAD, GERD, diabetes, and high cholesterol. He has a history of prostate cancer, NSTEMI, GI bleed and anemia.  Accompanied by his wife at today's visit.  Routine CRC screening: colonoscopy scheduled for February.  Eye exam: done in February this year  foot exam: done today Labs: due for routine labs  New or worsening pain: none  Has been having bowel issues since shortly after his gallbladder surgery. Working on avoiding foods that trigger symptoms      09/16/2023    2:15 PM 09/10/2022    3:11 PM 07/30/2021    8:51 AM  MMSE - Mini Mental State Exam  Orientation to time 5 5 5   Orientation to Place 5 5 5   Registration 3 3 3   Attention/ Calculation 5 5 5   Recall 3 3 3   Language- name 2 objects 2 2 2   Language- repeat 1 1 1   Language- follow 3 step command 3 3 3   Language- read & follow direction 1 1 1   Write a sentence 1 1 1   Copy design 1 1 1   Total score 30 30 30     Functional Status Survey: Is the patient deaf or have difficulty hearing?: No Does the patient have difficulty seeing, even when wearing glasses/contacts?: No Does the patient have difficulty concentrating, remembering, or making decisions?: No Does the patient have difficulty walking or climbing stairs?: No Does the patient have difficulty dressing or bathing?: No Does the patient have difficulty doing errands alone such as visiting a doctor's office or shopping?: No     01/30/2022    8:32 AM 05/01/2022    8:32 AM 09/10/2022    3:07 PM 12/16/2022    8:36 AM 09/16/2023    2:09  PM  Fall Risk  Falls in the past year? 0 0 0 0 0  Was there an injury with Fall?   0 0 0  Fall Risk Category Calculator   0 0 0  Fall Risk Category (Retired)   Low    (RETIRED) Patient Fall Risk Level  Low fall risk Low fall risk    Patient at Risk for Falls Due to  No Fall Risks No Fall Risks No Fall Risks No Fall Risks  Fall risk Follow up  Falls evaluation completed Falls evaluation completed Falls evaluation completed Falls evaluation completed       09/16/2023    2:09 PM  Depression screen PHQ 2/9  Decreased Interest 0  Down, Depressed, Hopeless 0  PHQ - 2 Score 0        No data to display            Current Medication: Outpatient Encounter Medications as of 09/16/2023  Medication Sig   acetaminophen (TYLENOL) 500 MG tablet Take 2 tablets (1,000 mg total) by mouth every 6 (six) hours as needed for mild pain.   ASPIRIN 81 PO Take by mouth.   atorvastatin (LIPITOR) 40 MG tablet TAKE 1 TABLET(40 MG) BY MOUTH DAILY AT 6 PM   cyclobenzaprine (FLEXERIL) 10 MG tablet Take 1 tablet (10 mg total) by  mouth at bedtime.   ibuprofen (ADVIL) 600 MG tablet Take 1 tablet (600 mg total) by mouth every 8 (eight) hours as needed for moderate pain.   losartan (COZAAR) 50 MG tablet TAKE 1 TABLET(50 MG) BY MOUTH TWICE DAILY   metoprolol succinate (TOPROL-XL) 50 MG 24 hr tablet Take 1 tablet (50 mg total) by mouth daily.   Tadalafil 2.5 MG TABS Take 1 tablet (2.5 mg total) by mouth daily. (Patient taking differently: Take 2.5 mg by mouth as needed.)   Tdap (BOOSTRIX) 5-2.5-18.5 LF-MCG/0.5 injection Inject 0.5 mLs into the muscle once for 1 dose.   Zoster Vaccine Adjuvanted [facility]) injection Inject 0.5 mLs into the muscle once for 1 dose.   [DISCONTINUED] amLODipine (NORVASC) 2.5 MG tablet Take 1 tablet (2.5 mg total) by mouth daily.   amLODipine (NORVASC) 2.5 MG tablet Take 1 tablet (2.5 mg total) by mouth daily.   [DISCONTINUED] metoprolol tartrate (LOPRESSOR) 25 MG tablet Take 1 tablet  (25 mg total) by mouth 2 (two) times daily.   No facility-administered encounter medications on file as of 09/16/2023.    Surgical History: Past Surgical History:  Procedure Laterality Date   ANAL FISTULOTOMY     APPENDECTOMY     as a child   CATARACT EXTRACTION Right 02/2022   CATARACT EXTRACTION Left 03/2022   COLONOSCOPY WITH PROPOFOL N/A 02/13/2021   Procedure: COLONOSCOPY WITH PROPOFOL;  Surgeon: Toney Reil, MD;  Location: [facility] ENDOSCOPY;  Service: Gastroenterology;  Laterality: N/A;   CORONARY STENT INTERVENTION N/A 02/24/2017   Procedure: Coronary Stent Intervention;  Surgeon: Alwyn Pea, MD;  Location: [facility] INVASIVE CV LAB;  Service: Cardiovascular;  Laterality: N/A;   ESOPHAGOGASTRODUODENOSCOPY N/A 02/10/2021   Procedure: ESOPHAGOGASTRODUODENOSCOPY (EGD);  Surgeon: Wyline Mood, MD;  Location: [facility] ENDOSCOPY;  Service: Gastroenterology;  Laterality: N/A;   ESOPHAGOGASTRODUODENOSCOPY (EGD) WITH PROPOFOL N/A 01/25/2021   Procedure: ESOPHAGOGASTRODUODENOSCOPY (EGD) WITH PROPOFOL;  Surgeon: Pasty Spillers, MD;  Location: [facility] ENDOSCOPY;  Service: Endoscopy;  Laterality: N/A;   GIVENS CAPSULE STUDY N/A 02/21/2021   Procedure: GIVENS CAPSULE STUDY;  Surgeon: Pasty Spillers, MD;  Location: [facility] ENDOSCOPY;  Service: Endoscopy;  Laterality: N/A;   LEFT HEART CATH AND CORONARY ANGIOGRAPHY N/A 02/24/2017   Procedure: Left Heart Cath and Coronary Angiography;  Surgeon: Dalia Heading, MD;  Location: [facility] INVASIVE CV LAB;  Service: Cardiovascular;  Laterality: N/A;   TONSILLECTOMY     TRIGGER FINGER RELEASE Right 10/2021    Medical History: Past Medical History:  Diagnosis Date   Anal fistula    a.) s/p anal fistulotomy   Anemia    Anginal pain (HCC)    Aortic atherosclerosis (HCC)    AVM (arteriovenous malformation) of duodenum    Bilateral carotid artery disease (HCC) 09/08/2020   a.) carotid doppler 09/08/2020: 50-70% RICA, < 50% LICA   Bilateral  renal cysts    Coronary artery disease 02/24/2017   a.) LHC/PCI 02/24/2017: 95% dRCA-1 (2.5 x 38 mm Xience Alpine DES), 60% dRCA-2, 75% RPDA (2.5 x 38 mm Xience Alpine DES), 60% OM1, 70% p-mLCx, 70% D1, 65% pLAD   DDD (degenerative disc disease), cervical    DDD (degenerative disc disease), thoracolumbar    Duodenitis    Erectile dysfunction    a.) on PDE5i (tadalafil)   Esophagitis    Gastric polyp    GERD (gastroesophageal reflux disease)    Gout    History of bilateral cataract extraction 2023   Hyperlipidemia    Hypertension  NSTEMI (non-ST elevated myocardial infarction) (HCC) 02/21/2017   a,) troponins were trended: <0.03 --> 2.98 --> 4.93 ng/mL; b.) LHC/PCI 02/24/2017 --> 2.5 x 38 mm Xience Alpine DES x 2 to dRCA-1 and RPDA (non-overlapping)   Osteoarthritis    Overactive bladder    Perirectal abscess    RBBB (right bundle branch block)    Trigger finger of all digits of right hand    Type 2 diabetes, diet controlled (HCC)    Ventricular bigeminy     Family History: Family History  Problem Relation Age of Onset   Diabetes Mother     Social History   Socioeconomic History   Marital status: Married    Spouse name: Drenda Freeze   Number of children: Not on file   Years of education: Not on file   Highest education level: Not on file  Occupational History   Not on file  Tobacco Use   Smoking status: Former    Passive exposure: Past   Smokeless tobacco: Never  Vaping Use   Vaping status: Never Used  Substance and Sexual Activity   Alcohol use: Yes    Alcohol/week: 6.0 standard drinks of alcohol    Types: 6 Cans of beer per week    Comment: 1 or 2 with dinner   Drug use: No   Sexual activity: Not on file  Other Topics Concern   Not on file  Social History Narrative   Not on file   Social Determinants of Health   Financial Resource Strain: Not on file  Food Insecurity: Not on file  Transportation Needs: Not on file  Physical Activity: Not on file  Stress:  Not on file  Social Connections: Unknown (02/18/2022)   Received from [facility]   Social Network    Social Network: Not on file  Intimate Partner Violence: Unknown (01/22/2022)   Received from [facility], [facility]   HITS    Physically Hurt: Not on file    Insult or Talk Down To: Not on file    Threaten Physical Harm: Not on file    Scream or Curse: Not on file      Review of Systems  Constitutional:  Negative for activity change, appetite change, chills, fatigue, fever and unexpected weight change.  HENT: Negative.  Negative for congestion, ear pain, rhinorrhea, sore throat and trouble swallowing.   Eyes: Negative.   Respiratory: Negative.  Negative for cough, chest tightness, shortness of breath and wheezing.   Cardiovascular: Negative.  Negative for chest pain and palpitations.  Gastrointestinal: Negative.  Negative for abdominal pain, blood in stool, constipation, diarrhea, nausea and vomiting.  Endocrine: Negative.   Genitourinary: Negative.  Negative for difficulty urinating, dysuria, frequency, hematuria and urgency.  Musculoskeletal:  Positive for arthralgias. Negative for back pain, joint swelling, myalgias and neck pain.  Skin: Negative.  Negative for rash and wound.  Allergic/Immunologic: Negative.  Negative for immunocompromised state.  Neurological: Negative.  Negative for dizziness, seizures, numbness and headaches.  Hematological: Negative.   Psychiatric/Behavioral: Negative.  Negative for behavioral problems, self-injury and suicidal ideas. The patient is not nervous/anxious.     Vital Signs: BP 135/70   Pulse 64   Temp 98.6 F (37 C)   Resp 16   Ht 5\' 10"  (1.778 m)   Wt 205 lb 6.4 oz (93.2 kg)   SpO2 97%   BMI 29.47 kg/m    Physical Exam Vitals reviewed.  Constitutional:      General: He is awake.  He is not in acute distress.    Appearance: Normal appearance. He is well-developed, well-groomed and overweight. He is not  ill-appearing or diaphoretic.  HENT:     Head: Normocephalic and atraumatic.     Right Ear: Tympanic membrane, ear canal and external ear normal.     Left Ear: Tympanic membrane, ear canal and external ear normal.     Nose: Nose normal. No congestion or rhinorrhea.     Mouth/Throat:     Lips: Pink.     Mouth: Mucous membranes are moist.     Pharynx: Oropharynx is clear. Uvula midline. No oropharyngeal exudate or posterior oropharyngeal erythema.  Eyes:     General: Lids are normal. Vision grossly intact. Gaze aligned appropriately. No scleral icterus.       Right eye: No discharge.        Left eye: No discharge.     Extraocular Movements: Extraocular movements intact.     Conjunctiva/sclera: Conjunctivae normal.     Pupils: Pupils are equal, round, and reactive to light.     Funduscopic exam:    Right eye: Red reflex present.        Left eye: Red reflex present. Neck:     Thyroid: No thyromegaly.     Vascular: No JVD.     Trachea: Trachea and phonation normal. No tracheal deviation.  Cardiovascular:     Rate and Rhythm: Normal rate and regular rhythm.     Pulses:          Carotid pulses are 3+ on the right side and 3+ on the left side.      Radial pulses are 2+ on the right side and 2+ on the left side.       Dorsalis pedis pulses are 2+ on the right side and 2+ on the left side.       Posterior tibial pulses are 2+ on the right side and 2+ on the left side.     Heart sounds: Normal heart sounds, S1 normal and S2 normal. No murmur heard.    No friction rub. No gallop.  Pulmonary:     Effort: Pulmonary effort is normal. No accessory muscle usage or respiratory distress.     Breath sounds: Normal breath sounds and air entry. No stridor. No wheezing or rales.  Chest:     Chest wall: No tenderness.  Abdominal:     General: Bowel sounds are normal. There is no distension.     Palpations: Abdomen is soft. There is no shifting dullness, fluid wave, mass or pulsatile mass.      Tenderness: There is no abdominal tenderness. There is no guarding or rebound.  Musculoskeletal:        General: No tenderness.     Cervical back: Normal range of motion and neck supple.     Right lower leg: No edema.     Left lower leg: No edema.     Right foot: Normal range of motion. No deformity, bunion, Charcot foot, foot drop or prominent metatarsal heads.     Left foot: Normal range of motion. No deformity, bunion, Charcot foot, foot drop or prominent metatarsal heads.  Feet:     Right foot:     Protective Sensation: 6 sites tested.  6 sites sensed.     Skin integrity: Dry skin present. No ulcer, blister, skin breakdown, erythema, warmth, callus or fissure.     Toenail Condition: Right toenails are abnormally thick and long.  Left foot:     Protective Sensation: 6 sites tested.  6 sites sensed.     Skin integrity: Dry skin present. No ulcer, blister, skin breakdown, erythema, warmth, callus or fissure.     Toenail Condition: Left toenails are abnormally thick and long.  Lymphadenopathy:     Cervical: No cervical adenopathy.  Skin:    General: Skin is warm and dry.     Capillary Refill: Capillary refill takes less than 2 seconds.     Coloration: Skin is not pale.     Findings: No erythema or rash.  Neurological:     Mental Status: He is alert and oriented to person, place, and time.     Cranial Nerves: No cranial nerve deficit.     Motor: No abnormal muscle tone.     Coordination: Coordination normal.     Gait: Gait normal.     Deep Tendon Reflexes: Reflexes are normal and symmetric.  Psychiatric:        Mood and Affect: Mood and affect normal.        Behavior: Behavior normal. Behavior is cooperative.        Thought Content: Thought content normal.        Judgment: Judgment normal.        Assessment/Plan: 1. Encounter for subsequent annual wellness visit (AWV) in Medicare patient ***  2. Type 2 diabetes mellitus with other circulatory complication, without  long-term current use of insulin (HCC) ***  3. Hypertension associated with diabetes (HCC) *** - amLODipine (NORVASC) 2.5 MG tablet; Take 1 tablet (2.5 mg total) by mouth daily.  Dispense: 90 tablet; Refill: 1  4. Aortic atherosclerosis (HCC) ***  5. Need for vaccination *** - Zoster Vaccine Adjuvanted [facility]) injection; Inject 0.5 mLs into the muscle once for 1 dose.  Dispense: 0.5 mL; Refill: 1 - Tdap (BOOSTRIX) 5-2.5-18.5 LF-MCG/0.5 injection; Inject 0.5 mLs into the muscle once for 1 dose.  Dispense: 0.5 mL; Refill: 0      General Counseling: Tom verbalizes understanding of the findings of todays visit and agrees with plan of treatment. I have discussed any further diagnostic evaluation that may be needed or ordered today. We also reviewed his medications today. he has been encouraged to call the office with any questions or concerns that should arise related to todays visit.    No orders of the defined types were placed in this encounter.   Meds ordered this encounter  Medications   amLODipine (NORVASC) 2.5 MG tablet    Sig: Take 1 tablet (2.5 mg total) by mouth daily.    Dispense:  90 tablet    Refill:  1   Zoster Vaccine Adjuvanted [facility]) injection    Sig: Inject 0.5 mLs into the muscle once for 1 dose.    Dispense:  0.5 mL    Refill:  1    Needs 2 dose series please   Tdap (BOOSTRIX) 5-2.5-18.5 LF-MCG/0.5 injection    Sig: Inject 0.5 mLs into the muscle once for 1 dose.    Dispense:  0.5 mL    Refill:  0    Return in about 4 months (around 01/14/2024) for F/U, Recheck A1C, Mayda Shippee PCP.   Total time spent:30 Minutes Time spent includes review of chart, medications, test results, and follow up plan with the patient.   [place] Controlled Substance Database was reviewed by me.  This patient was seen by Sallyanne Kuster, FNP-C in collaboration with Dr. Beverely Risen as a part of collaborative care agreement.  Victor Langenbach R. Tedd Sias, MSN, FNP-C Internal medicine

## 2023-09-17 ENCOUNTER — Encounter: Payer: Self-pay | Admitting: Nurse Practitioner

## 2023-09-19 ENCOUNTER — Other Ambulatory Visit: Payer: Self-pay | Admitting: Nurse Practitioner

## 2023-09-20 LAB — COMPREHENSIVE METABOLIC PANEL
ALT: 14 [IU]/L (ref 0–44)
AST: 17 [IU]/L (ref 0–40)
Albumin: 4.2 g/dL (ref 3.8–4.8)
Alkaline Phosphatase: 142 [IU]/L — ABNORMAL HIGH (ref 44–121)
BUN/Creatinine Ratio: 15 (ref 10–24)
BUN: 15 mg/dL (ref 8–27)
Bilirubin Total: 0.8 mg/dL (ref 0.0–1.2)
CO2: 24 mmol/L (ref 20–29)
Calcium: 9.6 mg/dL (ref 8.6–10.2)
Chloride: 101 mmol/L (ref 96–106)
Creatinine, Ser: 0.97 mg/dL (ref 0.76–1.27)
Globulin, Total: 2.8 g/dL (ref 1.5–4.5)
Glucose: 130 mg/dL — ABNORMAL HIGH (ref 70–99)
Potassium: 4.7 mmol/L (ref 3.5–5.2)
Sodium: 139 mmol/L (ref 134–144)
Total Protein: 7 g/dL (ref 6.0–8.5)
eGFR: 82 mL/min/{1.73_m2} (ref >59)

## 2023-09-20 LAB — CBC WITH DIFFERENTIAL/PLATELET
Basophils Absolute: 0 10*3/uL (ref 0.0–0.2)
Basos: 1 %
EOS (ABSOLUTE): 0.1 10*3/uL (ref 0.0–0.4)
Eos: 2 %
Hematocrit: 47.1 % (ref 37.5–51.0)
Hemoglobin: 15.3 g/dL (ref 13.0–17.7)
Immature Grans (Abs): 0 10*3/uL (ref 0.0–0.1)
Immature Granulocytes: 0 %
Lymphocytes Absolute: 1.8 10*3/uL (ref 0.7–3.1)
Lymphs: 25 %
MCH: 28.1 pg (ref 26.6–33.0)
MCHC: 32.5 g/dL (ref 31.5–35.7)
MCV: 87 fL (ref 79–97)
Monocytes Absolute: 0.7 10*3/uL (ref 0.1–0.9)
Monocytes: 10 %
Neutrophils Absolute: 4.3 10*3/uL (ref 1.4–7.0)
Neutrophils: 62 %
Platelets: 255 10*3/uL (ref 150–450)
RBC: 5.44 x10E6/uL (ref 4.14–5.80)
RDW: 13 % (ref 11.6–15.4)
WBC: 6.9 10*3/uL (ref 3.4–10.8)

## 2023-09-20 LAB — IRON AND TIBC
Iron Saturation: 20 % (ref 15–55)
Iron: 58 ug/dL (ref 38–169)
Total Iron Binding Capacity: 291 ug/dL (ref 250–450)
UIBC: 233 ug/dL (ref 111–343)

## 2023-09-20 LAB — T4, FREE: Free T4: 1 ng/dL (ref 0.82–1.77)

## 2023-09-20 LAB — LIPID PANEL
Chol/HDL Ratio: 2.9 {ratio} (ref 0.0–5.0)
Cholesterol, Total: 101 mg/dL (ref 100–199)
HDL: 35 mg/dL — ABNORMAL LOW (ref >39)
LDL Chol Calc (NIH): 50 mg/dL (ref 0–99)
Triglycerides: 77 mg/dL (ref 0–149)
VLDL Cholesterol Cal: 16 mg/dL (ref 5–40)

## 2023-09-20 LAB — B12 AND FOLATE PANEL
Folate: 8.3 ng/mL (ref >3.0)
Vitamin B-12: 310 pg/mL (ref 232–1245)

## 2023-09-20 LAB — TSH: TSH: 2.49 u[IU]/mL (ref 0.450–4.500)

## 2023-09-20 LAB — FERRITIN: Ferritin: 183 ng/mL (ref 30–400)

## 2023-09-20 LAB — HGB A1C W/O EAG: Hgb A1c MFr Bld: 6.9 % — ABNORMAL HIGH (ref 4.8–5.6)

## 2023-09-20 LAB — VITAMIN D 25 HYDROXY (VIT D DEFICIENCY, FRACTURES): Vit D, 25-Hydroxy: 27.2 ng/mL — ABNORMAL LOW (ref 30.0–100.0)

## 2023-10-17 ENCOUNTER — Encounter
Admission: RE | Admit: 2023-10-17 | Discharge: 2023-10-17 | Disposition: A | Discharge status: Home / Self Care | Payer: Medicare Other | Source: Ambulatory Visit | Attending: Urology | Admitting: Urology

## 2023-10-17 ENCOUNTER — Other Ambulatory Visit: Payer: Self-pay

## 2023-10-17 VITALS — Ht 70.5 in | Wt 196.0 lb

## 2023-10-17 DIAGNOSIS — E1165 Type 2 diabetes mellitus with hyperglycemia: Secondary | ICD-10-CM

## 2023-10-17 DIAGNOSIS — Z01812 Encounter for preprocedural laboratory examination: Secondary | ICD-10-CM

## 2023-10-17 DIAGNOSIS — I2510 Atherosclerotic heart disease of native coronary artery without angina pectoris: Secondary | ICD-10-CM

## 2023-10-17 DIAGNOSIS — I214 Non-ST elevation (NSTEMI) myocardial infarction: Secondary | ICD-10-CM

## 2023-10-17 DIAGNOSIS — I10 Essential (primary) hypertension: Secondary | ICD-10-CM

## 2023-10-17 NOTE — Patient Instructions (Addendum)
Your procedure is scheduled on: Friday January 10  Report to the Registration Desk on the 1st floor of the [facility] To find out your arrival time, please call [phone] between 1PM - 3PM on:  Thursday January 9 If your arrival time is 6:00 am, do not arrive before that time as the [facility] entrance doors do not open until 6:00 am.  REMEMBER: Instructions that are not followed completely may result in serious medical risk, up to and including death; or upon the discretion of your surgeon and anesthesiologist your surgery may need to be rescheduled.  Do not eat food after midnight the night before surgery.  No gum chewing or hard candies.   One week prior to surgery: Starting Friday January  3  Stop Anti-inflammatories (NSAIDS) such as Advil, Aleve, Ibuprofen, Motrin, Naproxen, Naprosyn and Aspirin based products such as Excedrin, Goody's Powder, BC Powder. Stop ANY OVER THE COUNTER supplements until after surgery.  You may however, continue to take Tylenol if needed for pain up until the day of surgery.  aspirin EC hold by Dr. Darrold Junker Clearance requested to stop 5 days prior to surgery, last dose Saturday December 28  Continue taking all of your other prescription medications up until the day of surgery.  ON THE DAY OF SURGERY ONLY TAKE THESE MEDICATIONS WITH SIPS OF WATER:  amLODipine (NORVASC)    No Alcohol for 24 hours before or after surgery.  No Smoking including e-cigarettes for 24 hours before surgery.  No chewable tobacco products for at least 6 hours before surgery.  No nicotine patches on the day of surgery.  Do not use any "recreational" drugs for at least a week (preferably 2 weeks) before your surgery.  Please be advised that the combination of cocaine and anesthesia may have negative outcomes, up to and including death. If you test positive for cocaine, your surgery will be cancelled.  On the morning of surgery brush your teeth with toothpaste and  water, you may rinse your mouth with mouthwash if you wish. Do not swallow any toothpaste or mouthwash.   Do not wear jewelry, make-up, hairpins, clips or nail polish.  For welded (permanent) jewelry: bracelets, anklets, waist bands, etc.  Please have this removed prior to surgery.  If it is not removed, there is a chance that hospital personnel will need to cut it off on the day of surgery.  Do not wear lotions, powders, or perfumes.   Do not shave body hair from the neck down 48 hours before surgery.  Contact lenses, hearing aids and dentures may not be worn into surgery.  Do not bring valuables to the hospital. [facility] is not responsible for any missing/lost belongings or valuables.   Notify your doctor if there is any change in your medical condition (cold, fever, infection).  Wear comfortable clothing (specific to your surgery type) to the hospital.  After surgery, you can help prevent lung complications by doing breathing exercises.  Take deep breaths and cough every 1-2 hours.   If you are being discharged the day of surgery, you will not be allowed to drive home. You will need a responsible individual to drive you home and stay with you for 24 hours after surgery.   If you are taking public transportation, you will need to have a responsible individual with you.  Please call the Pre-admissions Testing Dept. at [phone] if you have any questions about these instructions.  Surgery Visitation Policy:  Patients having surgery or  a procedure may have two visitors.  Children under the age of 70 must have an adult with them who is not the patient.

## 2023-10-21 ENCOUNTER — Encounter
Admission: RE | Admit: 2023-10-21 | Discharge: 2023-10-21 | Disposition: A | Discharge status: Home / Self Care | Payer: Medicare Other | Source: Ambulatory Visit | Attending: Urology | Admitting: Urology

## 2023-10-21 ENCOUNTER — Telehealth: Payer: Self-pay

## 2023-10-21 DIAGNOSIS — I493 Ventricular premature depolarization: Secondary | ICD-10-CM | POA: Insufficient documentation

## 2023-10-21 DIAGNOSIS — I252 Old myocardial infarction: Secondary | ICD-10-CM | POA: Insufficient documentation

## 2023-10-21 DIAGNOSIS — Z01810 Encounter for preprocedural cardiovascular examination: Secondary | ICD-10-CM | POA: Diagnosis present

## 2023-10-21 DIAGNOSIS — I2510 Atherosclerotic heart disease of native coronary artery without angina pectoris: Secondary | ICD-10-CM | POA: Diagnosis not present

## 2023-10-21 DIAGNOSIS — Z01812 Encounter for preprocedural laboratory examination: Secondary | ICD-10-CM

## 2023-10-21 DIAGNOSIS — I214 Non-ST elevation (NSTEMI) myocardial infarction: Secondary | ICD-10-CM

## 2023-10-21 DIAGNOSIS — I10 Essential (primary) hypertension: Secondary | ICD-10-CM | POA: Insufficient documentation

## 2023-10-21 DIAGNOSIS — E1165 Type 2 diabetes mellitus with hyperglycemia: Secondary | ICD-10-CM | POA: Insufficient documentation

## 2023-10-21 NOTE — Telephone Encounter (Signed)
Received clearance from Dr. Darrold Junker regarding holding ASA prior to surgery. Patient has been advised to hold ASA at least 3 days prior to surgery.

## 2023-10-22 ENCOUNTER — Inpatient Hospital Stay: Admission: RE | Admit: 2023-10-22 | Payer: Medicare Other | Source: Ambulatory Visit

## 2023-10-23 ENCOUNTER — Other Ambulatory Visit: Payer: Medicare Other

## 2023-10-23 DIAGNOSIS — N138 Other obstructive and reflux uropathy: Secondary | ICD-10-CM

## 2023-10-23 LAB — MICROSCOPIC EXAMINATION
Bacteria, UA: NONE SEEN
Epithelial Cells (non renal): NONE SEEN /[HPF] (ref 0–10)

## 2023-10-23 LAB — URINALYSIS, COMPLETE
Bilirubin, UA: NEGATIVE
Glucose, UA: NEGATIVE
Ketones, UA: NEGATIVE
Leukocytes,UA: NEGATIVE
Nitrite, UA: NEGATIVE
Protein,UA: NEGATIVE
Specific Gravity, UA: 1.02 (ref 1.005–1.030)
Urobilinogen, Ur: 0.2 mg/dL (ref 0.2–1.0)
pH, UA: 5.5 (ref 5.0–7.5)

## 2023-10-27 LAB — CULTURE, URINE COMPREHENSIVE

## 2023-10-27 NOTE — Progress Notes (Signed)
 Cardiac clearance on chart from Dr Darrold Junker (10-21-23)-Pt is optimized and Low risk

## 2023-10-30 ENCOUNTER — Ambulatory Visit: Payer: Medicare Other | Admitting: Dermatology

## 2023-10-31 ENCOUNTER — Other Ambulatory Visit: Payer: Self-pay

## 2023-10-31 ENCOUNTER — Ambulatory Visit: Payer: Medicare Other | Admitting: Anesthesiology

## 2023-10-31 ENCOUNTER — Encounter: Payer: Self-pay | Admitting: Urology

## 2023-10-31 ENCOUNTER — Ambulatory Visit
Admission: RE | Admit: 2023-10-31 | Discharge: 2023-10-31 | Disposition: A | Discharge status: Home / Self Care | Payer: Medicare Other | Attending: Urology | Admitting: Urology

## 2023-10-31 ENCOUNTER — Encounter
Admission: RE | Disposition: A | Discharge status: Home / Self Care | Payer: Self-pay | Source: Home / Self Care | Attending: Urology

## 2023-10-31 DIAGNOSIS — Z955 Presence of coronary angioplasty implant and graft: Secondary | ICD-10-CM | POA: Diagnosis not present

## 2023-10-31 DIAGNOSIS — I10 Essential (primary) hypertension: Secondary | ICD-10-CM | POA: Insufficient documentation

## 2023-10-31 DIAGNOSIS — I252 Old myocardial infarction: Secondary | ICD-10-CM | POA: Diagnosis not present

## 2023-10-31 DIAGNOSIS — Z87891 Personal history of nicotine dependence: Secondary | ICD-10-CM | POA: Diagnosis not present

## 2023-10-31 DIAGNOSIS — E1165 Type 2 diabetes mellitus with hyperglycemia: Secondary | ICD-10-CM

## 2023-10-31 DIAGNOSIS — N138 Other obstructive and reflux uropathy: Secondary | ICD-10-CM | POA: Insufficient documentation

## 2023-10-31 DIAGNOSIS — I2510 Atherosclerotic heart disease of native coronary artery without angina pectoris: Secondary | ICD-10-CM | POA: Diagnosis not present

## 2023-10-31 DIAGNOSIS — N401 Enlarged prostate with lower urinary tract symptoms: Secondary | ICD-10-CM | POA: Diagnosis not present

## 2023-10-31 DIAGNOSIS — E119 Type 2 diabetes mellitus without complications: Secondary | ICD-10-CM | POA: Diagnosis not present

## 2023-10-31 DIAGNOSIS — Z01812 Encounter for preprocedural laboratory examination: Secondary | ICD-10-CM

## 2023-10-31 HISTORY — PX: HOLEP-LASER ENUCLEATION OF THE PROSTATE WITH MORCELLATION: SHX6641

## 2023-10-31 SURGERY — ENUCLEATION, PROSTATE, USING LASER, WITH MORCELLATION
Anesthesia: General

## 2023-10-31 MED ORDER — FENTANYL CITRATE (PF) 100 MCG/2ML IJ SOLN: 25.0000 ug | INTRAMUSCULAR | Status: DC | PRN

## 2023-10-31 MED ORDER — DEXAMETHASONE SODIUM PHOSPHATE 10 MG/ML IJ SOLN
INTRAMUSCULAR | Status: DC | PRN
  Administered 2023-10-31: 10 mg via INTRAVENOUS

## 2023-10-31 MED ORDER — ONDANSETRON HCL 4 MG/2ML IJ SOLN
INTRAMUSCULAR | Status: DC | PRN
  Administered 2023-10-31: 4 mg via INTRAVENOUS

## 2023-10-31 MED ORDER — ONDANSETRON HCL 4 MG/2ML IJ SOLN
INTRAMUSCULAR | Status: AC
  Filled 2023-10-31: qty 2

## 2023-10-31 MED ORDER — CHLORHEXIDINE GLUCONATE 0.12 % MT SOLN
OROMUCOSAL | Status: AC
Start: 2023-10-31 — End: ?
  Filled 2023-10-31: qty 15

## 2023-10-31 MED ORDER — SUGAMMADEX SODIUM 200 MG/2ML IV SOLN
INTRAVENOUS | Status: DC | PRN
  Administered 2023-10-31: 200 mg via INTRAVENOUS

## 2023-10-31 MED ORDER — LIDOCAINE HCL (CARDIAC) PF 100 MG/5ML IV SOSY
PREFILLED_SYRINGE | INTRAVENOUS | Status: DC | PRN
  Administered 2023-10-31: 100 mg via INTRAVENOUS

## 2023-10-31 MED ORDER — ACETAMINOPHEN 10 MG/ML IV SOLN
INTRAVENOUS | Status: AC
  Filled 2023-10-31: qty 100

## 2023-10-31 MED ORDER — CEFAZOLIN SODIUM-DEXTROSE 2-4 GM/100ML-% IV SOLN
INTRAVENOUS | Status: AC
  Filled 2023-10-31: qty 100

## 2023-10-31 MED ORDER — ACETAMINOPHEN 10 MG/ML IV SOLN
1000.0000 mg | Freq: Once | INTRAVENOUS | Status: DC | PRN
Start: 2023-10-31 — End: 2023-10-31

## 2023-10-31 MED ORDER — ORAL CARE MOUTH RINSE: 15.0000 mL | Freq: Once | OROMUCOSAL | Status: AC

## 2023-10-31 MED ORDER — ROCURONIUM BROMIDE 10 MG/ML (PF) SYRINGE
PREFILLED_SYRINGE | INTRAVENOUS | Status: AC
  Filled 2023-10-31: qty 10

## 2023-10-31 MED ORDER — ACETAMINOPHEN 10 MG/ML IV SOLN
INTRAVENOUS | Status: DC | PRN
  Administered 2023-10-31: 1000 mg via INTRAVENOUS

## 2023-10-31 MED ORDER — LACTATED RINGERS IV SOLN: INTRAVENOUS | Status: DC

## 2023-10-31 MED ORDER — ROCURONIUM BROMIDE 100 MG/10ML IV SOLN
INTRAVENOUS | Status: DC | PRN
  Administered 2023-10-31: 50 mg via INTRAVENOUS
  Administered 2023-10-31: 10 mg via INTRAVENOUS

## 2023-10-31 MED ORDER — ONDANSETRON HCL 4 MG/2ML IJ SOLN: 4.0000 mg | Freq: Once | INTRAMUSCULAR | Status: DC | PRN

## 2023-10-31 MED ORDER — CEFAZOLIN SODIUM-DEXTROSE 2-4 GM/100ML-% IV SOLN
2.0000 g | INTRAVENOUS | Status: AC
  Administered 2023-10-31: 2 g via INTRAVENOUS

## 2023-10-31 MED ORDER — DEXAMETHASONE SODIUM PHOSPHATE 10 MG/ML IJ SOLN
INTRAMUSCULAR | Status: AC
  Filled 2023-10-31: qty 1

## 2023-10-31 MED ORDER — PROPOFOL 10 MG/ML IV BOLUS
INTRAVENOUS | Status: AC
  Filled 2023-10-31: qty 20

## 2023-10-31 MED ORDER — OXYCODONE HCL 5 MG PO TABS
5.0000 mg | ORAL_TABLET | Freq: Once | ORAL | Status: AC | PRN
  Administered 2023-10-31: 5 mg via ORAL

## 2023-10-31 MED ORDER — LIDOCAINE HCL (PF) 2 % IJ SOLN
INTRAMUSCULAR | Status: AC
  Filled 2023-10-31: qty 5

## 2023-10-31 MED ORDER — PROPOFOL 10 MG/ML IV BOLUS
INTRAVENOUS | Status: DC | PRN
  Administered 2023-10-31: 150 mg via INTRAVENOUS

## 2023-10-31 MED ORDER — OXYCODONE HCL 5 MG/5ML PO SOLN: 5.0000 mg | Freq: Once | ORAL | Status: AC | PRN

## 2023-10-31 MED ORDER — OXYBUTYNIN CHLORIDE ER 10 MG PO TB24: 10.0000 mg | ORAL_TABLET | Freq: Every day | ORAL | 0 refills | Status: DC | PRN

## 2023-10-31 MED ORDER — SODIUM CHLORIDE 0.9 % IR SOLN
Status: DC | PRN
  Administered 2023-10-31: 6000 mL
  Administered 2023-10-31: 9000 mL

## 2023-10-31 MED ORDER — CHLORHEXIDINE GLUCONATE 0.12 % MT SOLN
15.0000 mL | Freq: Once | OROMUCOSAL | Status: AC
  Administered 2023-10-31: 15 mL via OROMUCOSAL

## 2023-10-31 MED ORDER — OXYCODONE HCL 5 MG PO TABS
ORAL_TABLET | ORAL | Status: AC
  Filled 2023-10-31: qty 1

## 2023-10-31 MED ORDER — FENTANYL CITRATE (PF) 100 MCG/2ML IJ SOLN
INTRAMUSCULAR | Status: AC
  Filled 2023-10-31: qty 2

## 2023-10-31 MED ORDER — FENTANYL CITRATE (PF) 100 MCG/2ML IJ SOLN
INTRAMUSCULAR | Status: DC | PRN
  Administered 2023-10-31 (×2): 50 ug via INTRAVENOUS

## 2023-10-31 MED ORDER — TRAMADOL HCL 50 MG PO TABS: 25.0000 mg | ORAL_TABLET | Freq: Four times a day (QID) | ORAL | 0 refills | Status: AC | PRN

## 2023-10-31 MED ORDER — SODIUM CHLORIDE 0.9 % IV SOLN: INTRAVENOUS | Status: DC

## 2023-10-31 SURGICAL SUPPLY — 31 items
ADAPTER IRRIG TUBE 2 SPIKE SOL (ADAPTER) ×2 IMPLANT
BAG URINE DRAIN 2000ML AR STRL (UROLOGICAL SUPPLIES) ×1 IMPLANT
BAG URO DRAIN 4000ML (MISCELLANEOUS) ×1 IMPLANT
CATH URETL OPEN END 4X70 (CATHETERS) ×1 IMPLANT
CATH URTH STD 24FR FL 3W 2 (CATHETERS) ×1 IMPLANT
CONTAINER COLLECT MORCELLATR (MISCELLANEOUS) ×1 IMPLANT
DRAPE UTILITY 15X26 TOWEL STRL (DRAPES) IMPLANT
ELECT BIVAP BIPO 22/24 DONUT (ELECTROSURGICAL)
ELECTRD BIVAP BIPO 22/24 DONUT (ELECTROSURGICAL) IMPLANT
FIBER LASER MOSES 550 DFL (Laser) ×1 IMPLANT
FILTER OVERFLOW MORCELLATOR (FILTER) ×1 IMPLANT
GLOVE BIOGEL PI IND STRL 7.5 (GLOVE) ×1 IMPLANT
GOWN STRL REUS W/ TWL LRG LVL3 (GOWN DISPOSABLE) ×1 IMPLANT
GOWN STRL REUS W/ TWL XL LVL3 (GOWN DISPOSABLE) ×1 IMPLANT
HOLDER FOLEY CATH W/STRAP (MISCELLANEOUS) ×1 IMPLANT
IV NS IRRIG 3000ML ARTHROMATIC (IV SOLUTION) ×5 IMPLANT
KIT TURNOVER CYSTO (KITS) ×1 IMPLANT
MBRN O SEALING YLW 17 FOR INST (MISCELLANEOUS) ×1
MEMBRANE SLNG YLW 17 FOR INST (MISCELLANEOUS) ×1 IMPLANT
MORCELLATOR COLLECT CONTAINER (MISCELLANEOUS) ×1
MORCELLATOR OVERFLOW FILTER (FILTER) ×1
MORCELLATOR ROTATION 4.75 335 (MISCELLANEOUS) ×1 IMPLANT
PACK CYSTO AR (MISCELLANEOUS) ×1 IMPLANT
SET CYSTO W/LG BORE CLAMP LF (SET/KITS/TRAYS/PACK) ×1 IMPLANT
SET IRRIG Y TYPE TUR BLADDER L (SET/KITS/TRAYS/PACK) ×1 IMPLANT
SLEEVE PROTECTION STRL DISP (MISCELLANEOUS) ×2 IMPLANT
SURGILUBE 2OZ TUBE FLIPTOP (MISCELLANEOUS) ×1 IMPLANT
SYR TOOMEY IRRIG 70ML (MISCELLANEOUS) ×1
SYRINGE TOOMEY IRRIG 70ML (MISCELLANEOUS) ×1 IMPLANT
TUBE PUMP MORCELLATOR PIRANHA (TUBING) ×1 IMPLANT
WATER STERILE IRR 1000ML POUR (IV SOLUTION) ×1 IMPLANT

## 2023-10-31 NOTE — Anesthesia Postprocedure Evaluation (Signed)
 Anesthesia Post Note  Patient: Mark Wyatt  Procedure(s) Performed: HOLEP-LASER ENUCLEATION OF THE PROSTATE WITH MORCELLATION  Patient location during evaluation: PACU Anesthesia Type: General Level of consciousness: awake and alert Pain management: pain level controlled Vital Signs Assessment: post-procedure vital signs reviewed and stable Respiratory status: spontaneous breathing, nonlabored ventilation, respiratory function stable and patient connected to nasal cannula oxygen Cardiovascular status: blood pressure returned to baseline and stable Postop Assessment: no apparent nausea or vomiting Anesthetic complications: no   No notable events documented.   Last Vitals:  Vitals:   10/31/23 1115 10/31/23 1130  BP: (!) 158/73 (!) 154/71  Pulse: (!) 56 (!) 57  Resp: 15 14  Temp:    SpO2: 100% 97%    Last Pain:  Vitals:   10/31/23 1130  TempSrc:   PainSc: 4                  Rome Ade

## 2023-10-31 NOTE — Op Note (Signed)
 Date of procedure: 10/31/23  Preoperative diagnosis:  BPH with obstruction  Postoperative diagnosis:  Same  Procedure: HoLEP (Holmium Laser Enucleation of the Prostate)  Surgeon: Redell Burnet, MD  Anesthesia: General  Complications: None  Intraoperative findings:  Moderate to severe bladder trabeculations, no suspicious lesions Uncomplicated HOLEP, excellent hemostasis, ureteral orifices and verumontanum intact at conclusion of case  EBL: Minimal  Specimens: Prostate chips  Enucleation time: 17 minutes  Morcellation time: 4 minutes  Intra-op weight: 28 g  Drains: 24 French three-way, 60 cc in balloon  Indication: Mark Wyatt is a 73 y.o. patient with both obstructive and overactive urinary symptoms who is failed trial of medical management.  He also underwent urodynamics that was consistent with an obstructive type picture, and he opted for HOLEP.  After reviewing the management options for treatment, they elected to proceed with the above surgical procedure(s). We have discussed the potential benefits and risks of the procedure, side effects of the proposed treatment, the likelihood of the patient achieving the goals of the procedure, and any potential problems that might occur during the procedure or recuperation.  We specifically discussed the risks of bleeding, infection, hematuria and clot retention, need for additional procedures, possible overnight hospital stay, temporary urgency and incontinence, rare long-term incontinence, and retrograde ejaculation.  Informed consent has been obtained.   Description of procedure:  The patient was taken to the operating room and general anesthesia was induced.  The patient was placed in the dorsal lithotomy position, prepped and draped in the usual sterile fashion, and preoperative antibiotics(Ancef ) were administered.  SCDs were placed for DVT prophylaxis.  A preoperative time-out was performed.   Mark Wyatt sounds were used to  gently dilated the urethra up to 26F. The 34 French continuous flow resectoscope was inserted into the urethra using the visual obturator  The prostate was large with obstructing lateral lobes, median lobe, and a high bladder neck. The bladder was thoroughly inspected and notable for moderate to severe trabeculations.  The ureteral orifices were located in orthotopic position.    The laser was set to 2 J and 60 Hz and early apical release was performed by making a circumferential mucosal incision proximal to the sphincter.  A lambda incision was then made proximal to the verumontanum.  The prostate was enucleated en bloc circumferentially into the bladder.  The capsule was examined and laser was used for meticulous hemostasis.    The 33 French resectoscope was then switched out for the 26 French nephroscope and prostate tissue was morcellated(Piranha) and the tissue sent to pathology.  A 24 French three-way catheter was inserted easily with the aid of a catheter guide, and 60 cc were placed in the balloon.  Urine was clear.  The catheter irrigated easily with a Toomey syringe.  CBI was initiated.   The patient tolerated the procedure well without any immediate complications and was extubated and transferred to the recovery room in stable condition.  Urine was clear on fast CBI.  Disposition: Stable to PACU  Plan: Wean CBI in PACU, anticipate discharge home today with Foley removal in clinic in 2-3 days  Redell Burnet, MD 10/31/2023

## 2023-10-31 NOTE — Transfer of Care (Signed)
 Immediate Anesthesia Transfer of Care Note  Patient: Mark Wyatt  Procedure(s) Performed: HOLEP-LASER ENUCLEATION OF THE PROSTATE WITH MORCELLATION  Patient Location: PACU  Anesthesia Type:General  Level of Consciousness: drowsy and patient cooperative  Airway & Oxygen Therapy: Patient Spontanous Breathing and Patient connected to nasal cannula oxygen  Post-op Assessment: Report given to RN and Post -op Vital signs reviewed and stable  Post vital signs: Reviewed and stable  Last Vitals:  Vitals Value Taken Time  BP 156/74 10/31/23 1055  Temp    Pulse 61 10/31/23 1056  Resp 14 10/31/23 1056  SpO2 100 % 10/31/23 1056  Vitals shown include unfiled device data.  Last Pain:  Vitals:   10/31/23 0746  TempSrc: Temporal  PainSc: 0-No pain         Complications: No notable events documented.

## 2023-10-31 NOTE — Progress Notes (Signed)
 CBI weaned at 1130 per care order instruction. Patient has tolerated well. Urine is light pink and clear.

## 2023-10-31 NOTE — H&P (Signed)
 10/31/23 9:36 AM   Mark Wyatt Nov 29, 1950 [id number]  CC: BPH and urinary symptoms  HPI: 73 year old male with multiyear history of worsening urinary symptoms with urgency, frequency, sensation of incomplete emptying, and nocturia.  He has been followed by Dr. Twylla.  He has had no improvement with medications previously including Flomax , Myrbetriq, or silodosin .  PVRs have been normal.  Cystoscopy has shown obstructive lateral lobes, and prostate measured 62 g on CT from May 2024, I personally viewed and interpreted those images.  PSA was normal at 1.8 in October 2021, and screening was discontinued at that time per the guideline recommendations.   With his mixed voiding and storage symptoms, Dr. Twylla recommended urodynamics in [place].  I reviewed the urodynamics showing an obstructed appearing pattern with max capacity of 520 mL, for sensation at 228 mL, no instability noted, voluntary contraction and voided 188 mL with a max flow of 2 mL/s, max detrusor pressure of 13 cm of water , straining noted, PVR .    He again reports primarily urgency and frequency during the day, and nocturia 5-8 times overnight.  He remains extremely bothered by his symptoms.  We reviewed his urodynamics that appear to show an obstructed picture, and I do think he would benefit from an outlet procedure.  We reviewed potential for prolonged healing with his significant storage symptoms, and even potentially need for additional overactive bladder medications in the future.  We reviewed outlet procedures like UroLift or HOLEP, I think he would benefit from a more definitive procedure like HOLEP with his severe symptoms.     PMH: Past Medical History:  Diagnosis Date   Anal fistula    a.) s/p anal fistulotomy   Anemia    Anginal pain (HCC)    Aortic atherosclerosis (HCC)    AVM (arteriovenous malformation) of duodenum    Bilateral carotid artery disease (HCC) 09/08/2020   a.) carotid doppler  09/08/2020: 50-70% RICA, < 50% LICA   Bilateral renal cysts    Coronary artery disease 02/24/2017   a.) LHC/PCI 02/24/2017: 95% dRCA-1 (2.5 x 38 mm Xience Alpine DES), 60% dRCA-2, 75% RPDA (2.5 x 38 mm Xience Alpine DES), 60% OM1, 70% p-mLCx, 70% D1, 65% pLAD   DDD (degenerative disc disease), cervical    DDD (degenerative disc disease), thoracolumbar    Duodenitis    Erectile dysfunction    a.) on PDE5i (tadalafil )   Esophagitis    Gastric polyp    GERD (gastroesophageal reflux disease)    Gout    History of bilateral cataract extraction 2023   Hyperlipidemia    Hypertension    NSTEMI (non-ST elevated myocardial infarction) (HCC) 02/21/2017   a,) troponins were trended: <0.03 --> 2.98 --> 4.93 ng/mL; b.) LHC/PCI 02/24/2017 --> 2.5 x 38 mm Xience Alpine DES x 2 to dRCA-1 and RPDA (non-overlapping)   Osteoarthritis    Overactive bladder    Perirectal abscess    RBBB (right bundle branch block)    Trigger finger of all digits of right hand    Type 2 diabetes, diet controlled (HCC)    Ventricular bigeminy     Surgical History: Past Surgical History:  Procedure Laterality Date   ANAL FISTULOTOMY     APPENDECTOMY     as a child   CATARACT EXTRACTION Right 02/2022   CATARACT EXTRACTION Left 03/2022   COLONOSCOPY WITH PROPOFOL  N/A 02/13/2021   Procedure: COLONOSCOPY WITH PROPOFOL ;  Surgeon: Unk Corinn Skiff, MD;  Location: [facility] ENDOSCOPY;  Service: Gastroenterology;  Laterality: N/A;   CORONARY STENT INTERVENTION N/A 02/24/2017   Procedure: Coronary Stent Intervention;  Surgeon: Florencio Cara BIRCH, MD;  Location: [facility] INVASIVE CV LAB;  Service: Cardiovascular;  Laterality: N/A;   ESOPHAGOGASTRODUODENOSCOPY N/A 02/10/2021   Procedure: ESOPHAGOGASTRODUODENOSCOPY (EGD);  Surgeon: Therisa Bi, MD;  Location: [facility] ENDOSCOPY;  Service: Gastroenterology;  Laterality: N/A;   ESOPHAGOGASTRODUODENOSCOPY (EGD) WITH PROPOFOL  N/A 01/25/2021   Procedure: ESOPHAGOGASTRODUODENOSCOPY (EGD) WITH  PROPOFOL ;  Surgeon: Janalyn Keene NOVAK, MD;  Location: [facility] ENDOSCOPY;  Service: Endoscopy;  Laterality: N/A;   GIVENS CAPSULE STUDY N/A 02/21/2021   Procedure: GIVENS CAPSULE STUDY;  Surgeon: Janalyn Keene NOVAK, MD;  Location: [facility] ENDOSCOPY;  Service: Endoscopy;  Laterality: N/A;   LEFT HEART CATH AND CORONARY ANGIOGRAPHY N/A 02/24/2017   Procedure: Left Heart Cath and Coronary Angiography;  Surgeon: Bosie Vinie LABOR, MD;  Location: [facility] INVASIVE CV LAB;  Service: Cardiovascular;  Laterality: N/A;   TONSILLECTOMY     TRIGGER FINGER RELEASE Right 10/2021    Family History: Family History  Problem Relation Age of Onset   Diabetes Mother     Social History:  reports that he has quit smoking. He has been exposed to tobacco smoke. He has never used smokeless tobacco. He reports current alcohol use of about 6.0 standard drinks of alcohol per week. He reports that he does not use drugs.  Physical Exam: BP (!) 151/78   Pulse 62   Temp 98 F (36.7 C) (Temporal)   Resp 15   Ht 5' 10.5 (1.791 m)   Wt 91.6 kg   SpO2 99%   BMI 28.57 kg/m    Constitutional:  Alert and oriented, No acute distress. Cardiovascular: Regular rate and rhythm Respiratory: Clear to auscultation bilaterally GI: Abdomen is soft, nontender, nondistended, no abdominal masses   Laboratory Data: Urine culture 1/2 no growth  Assessment & Plan:   73 year old male with long history of bothersome urinary symptoms, minimal improvement with medical therapy, urodynamics suggesting an obstructed picture, who opted for HOLEP.  We discussed the risks and benefits of HoLEP at length.  The procedure requires general anesthesia and takes 1 to 2 hours, and a holmium laser is used to enucleate the prostate and push this tissue into the bladder.  A morcellator is then used to remove this tissue, which is sent for pathology.  The vast majority(>95%) of patients are able to discharge the same day with a catheter in place for 2 to 3  days, and will follow-up in clinic for a voiding trial.  We specifically discussed the risks of bleeding, infection, retrograde ejaculation, temporary urgency and urge incontinence, very low risk of long-term incontinence, urethral stricture/bladder neck contracture, pathologic evaluation of prostate tissue and possible detection of prostate cancer or other malignancy, and possible need for additional procedures.  HOLEP today   Redell Burnet, MD 10/31/2023  [facility] [address], [address] [place], [place] [address] [phone]

## 2023-10-31 NOTE — Anesthesia Procedure Notes (Signed)
 Procedure Name: Intubation Date/Time: 10/31/2023 9:56 AM  Performed by: Lorriane Arabia, CRNAPre-anesthesia Checklist: Patient identified, Patient being monitored, Timeout performed, Emergency Drugs available and Suction available Patient Re-evaluated:Patient Re-evaluated prior to induction Oxygen Delivery Method: Circle system utilized Preoxygenation: Pre-oxygenation with 100% oxygen Induction Type: IV induction Ventilation: Mask ventilation without difficulty Laryngoscope Size: 3 and McGrath Grade View: Grade I Tube type: Oral Tube size: 7.0 mm Number of attempts: 1 Airway Equipment and Method: Stylet Placement Confirmation: ETT inserted through vocal cords under direct vision, positive ETCO2 and breath sounds checked- equal and bilateral Secured at: 22 cm Tube secured with: Tape Dental Injury: Teeth and Oropharynx as per pre-operative assessment  Comments: Smooth atraumatic intubation, no complications noted

## 2023-10-31 NOTE — Anesthesia Preprocedure Evaluation (Signed)
 Anesthesia Evaluation  Patient identified by MRN, date of birth, ID band Patient awake    Reviewed: Allergy & Precautions, H&P , NPO status , Patient's Chart, lab work & pertinent test results  History of Anesthesia Complications Negative for: history of anesthetic complications  Airway Mallampati: III  TM Distance: >3 FB Neck ROM: full    Dental  (+) Chipped, Edentulous Upper, Partial Lower, Dental Advisory Given   Pulmonary neg pulmonary ROS, neg sleep apnea, neg COPD, Patient abstained from smoking.Not current smoker, former smoker   Pulmonary exam normal breath sounds clear to auscultation       Cardiovascular Exercise Tolerance: Good METShypertension, (-) angina + CAD, + Past MI and + Cardiac Stents  Normal cardiovascular exam(-) dysrhythmias  Rhythm:regular Rate:Normal - Systolic murmurs    Neuro/Psych negative neurological ROS  negative psych ROS   GI/Hepatic negative GI ROS, Neg liver ROS,neg GERD  ,,  Endo/Other  diabetes    Renal/GU negative Renal ROS     Musculoskeletal   Abdominal   Peds  Hematology negative hematology ROS (+)   Anesthesia Other Findings Past Medical History: No date: Anal fistula     Comment:  a.) s/p anal fistulotomy No date: Anemia No date: Anginal pain (HCC) No date: Aortic atherosclerosis (HCC) No date: 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/08/2020: Bilateral carotid artery disease (HCC)     Comment:  a.) carotid doppler 09/08/2020: 50-70% RICA, < 50% LICA No date: Bilateral renal cysts 02/24/2017: Coronary artery disease     Comment:  a.) LHC/PCI 02/24/2017: 95% dRCA-1 (2.5 x 38 mm Xience               Alpine DES), 60% dRCA-2, 75% RPDA (2.5 x 38 mm Xience               Alpine DES), 60% OM1, 70% p-mLCx, 70% D1, 65% pLAD No date: DDD (degenerative disc disease), cervical No date: DDD (degenerative disc disease), thoracolumbar No date: Duodenitis No date: Erectile  dysfunction     Comment:  a.) on PDE5i (tadalafil ) No date: Esophagitis No date: Gastric polyp No date: GERD (gastroesophageal reflux disease) No date: Gout 2023: History of bilateral cataract extraction No date: Hyperlipidemia No date: Hypertension 02/21/2017: NSTEMI (non-ST elevated myocardial infarction) (HCC)     Comment:  a,) troponins were trended: <0.03 --> 2.98 --> 4.93               ng/mL; b.) LHC/PCI 02/24/2017 --> 2.5 x 38 mm Xience               Alpine DES x 2 to dRCA-1 and RPDA (non-overlapping) No date: Osteoarthritis No date: Overactive bladder No date: Perirectal abscess No date: RBBB (right bundle branch block) No date: Trigger finger of all digits of right hand No date: Type 2 diabetes, diet controlled (HCC) No date: Ventricular bigeminy  Past Surgical History: No date: ANAL FISTULOTOMY No date: APPENDECTOMY     Comment:  as a child 02/2022: CATARACT EXTRACTION; Right 03/2022: CATARACT EXTRACTION; Left 02/13/2021: COLONOSCOPY WITH PROPOFOL ; N/A     Comment:  Procedure: COLONOSCOPY WITH PROPOFOL ;  Surgeon: Unk Corinn Skiff, MD;  Location: [facility] ENDOSCOPY;  Service:               Gastroenterology;  Laterality: N/A; 02/24/2017: CORONARY STENT INTERVENTION; N/A     Comment:  Procedure: Coronary Stent Intervention;  Surgeon:  Callwood, Dwayne D, MD;  Location: [facility] INVASIVE CV LAB;               Service: Cardiovascular;  Laterality: N/A; 02/10/2021: ESOPHAGOGASTRODUODENOSCOPY; N/A     Comment:  Procedure: ESOPHAGOGASTRODUODENOSCOPY (EGD);  Surgeon:               Therisa Bi, MD;  Location: [facility] ENDOSCOPY;  Service:               Gastroenterology;  Laterality: N/A; 01/25/2021: ESOPHAGOGASTRODUODENOSCOPY (EGD) WITH PROPOFOL ; N/A     Comment:  Procedure: ESOPHAGOGASTRODUODENOSCOPY (EGD) WITH               PROPOFOL ;  Surgeon: Janalyn Keene NOVAK, MD;  Location:               [facility] ENDOSCOPY;  Service: Endoscopy;  Laterality:  N/A; 02/21/2021: GIVENS CAPSULE STUDY; N/A     Comment:  Procedure: GIVENS CAPSULE STUDY;  Surgeon: Janalyn Keene NOVAK, MD;  Location: [facility] ENDOSCOPY;  Service:               Endoscopy;  Laterality: N/A; 02/24/2017: LEFT HEART CATH AND CORONARY ANGIOGRAPHY; N/A     Comment:  Procedure: Left Heart Cath and Coronary Angiography;                Surgeon: Bosie Vinie LABOR, MD;  Location: [facility] INVASIVE CV              LAB;  Service: Cardiovascular;  Laterality: N/A; No date: TONSILLECTOMY 10/2021: TRIGGER FINGER RELEASE; Right  BMI    Body Mass Index: 29.56 kg/m      Reproductive/Obstetrics negative OB ROS                             Anesthesia Physical Anesthesia Plan  ASA: 3  Anesthesia Plan: General   Post-op Pain Management: Ofirmev  IV (intra-op)*   Induction: Intravenous  PONV Risk Score and Plan: 3 and Ondansetron , Dexamethasone  and Treatment may vary due to age or medical condition  Airway Management Planned: Oral ETT and Video Laryngoscope Planned  Additional Equipment: None  Intra-op Plan:   Post-operative Plan: Extubation in OR  Informed Consent: I have reviewed the patients History and Physical, chart, labs and discussed the procedure including the risks, benefits and alternatives for the proposed anesthesia with the patient or authorized representative who has indicated his/her understanding and acceptance.     Dental Advisory Given  Plan Discussed with: Anesthesiologist, CRNA and Surgeon  Anesthesia Plan Comments: (Discussed risks of anesthesia with patient, including PONV, sore throat, lip/dental/eye damage. Rare risks discussed as well, such as cardiorespiratory and neurological sequelae, and allergic reactions. Discussed the role of CRNA in patient's perioperative care. Patient understands.)       Anesthesia Quick Evaluation

## 2023-11-01 ENCOUNTER — Encounter: Payer: Self-pay | Admitting: Urology

## 2023-11-03 ENCOUNTER — Ambulatory Visit (INDEPENDENT_AMBULATORY_CARE_PROVIDER_SITE_OTHER): Payer: Medicare Other | Admitting: Physician Assistant

## 2023-11-03 ENCOUNTER — Other Ambulatory Visit: Payer: Self-pay | Admitting: Physician Assistant

## 2023-11-03 ENCOUNTER — Encounter: Payer: Medicare Other | Admitting: Physician Assistant

## 2023-11-03 VITALS — BP 140/80 | HR 78 | Ht 71.0 in | Wt 202.0 lb

## 2023-11-03 DIAGNOSIS — N401 Enlarged prostate with lower urinary tract symptoms: Secondary | ICD-10-CM

## 2023-11-03 DIAGNOSIS — N138 Other obstructive and reflux uropathy: Secondary | ICD-10-CM

## 2023-11-03 DIAGNOSIS — R066 Hiccough: Secondary | ICD-10-CM

## 2023-11-03 LAB — SURGICAL PATHOLOGY

## 2023-11-03 MED ORDER — HYDROCODONE-ACETAMINOPHEN 5-325 MG PO TABS: 1.0000 | ORAL_TABLET | Freq: Four times a day (QID) | ORAL | 0 refills | Status: DC | PRN

## 2023-11-03 MED ORDER — PANTOPRAZOLE SODIUM 20 MG PO TBEC: 20.0000 mg | DELAYED_RELEASE_TABLET | Freq: Every day | ORAL | 0 refills | Status: DC

## 2023-11-03 NOTE — Telephone Encounter (Signed)
 Asking for 90 day supply, is that ok or just 30 day supply?

## 2023-11-03 NOTE — Progress Notes (Signed)
 Catheter Removal  Patient is present today for a catheter removal.  57ml of water  was drained from the balloon. A 24FR three-way foley cath was removed from the bladder, no complications were noted. Patient tolerated well.  Performed by: Jannifer Fischler, PA-C   Additional notes: Counseled patient on normal postoperative findings including dysuria, gross hematuria, and urinary urgency/leakage. Counseled patient to begin Kegel exercises 3x10 sets daily to increase urinary control and wear absorbent products as needed for security. Written and verbal resources provided today. Surgical pathology pending; will defer to Dr. Francisca to share results when available.   He reports hiccups since surgery 3 days ago. They started within a couple of hours after arriving home Friday and have persisted until today with only brief interruptions after meals. He has attempted breath holding and his wife has tried to startle him but the hiccups have returned. He denies chest pain, SOB, dizziness, numbness, tingling, or weakness. Will start pantoprazole  and have him cycle through hiccup cessation maneuvers and follow up with us  if no resolution within 3 weeks.  He poorly tolerated Foley with bladder spasms, reporting 10/10 pain on arrival today. After Foley removal he was visibly more comfortable and he rated pain 5/10. Refilling a small volume of Norco. We discussed he may alternate this with Tylenol  if needed but should not take the two together.  Follow up: Return in about 4 months (around 03/02/2024) for Postop f/u with Dr. Francisca.

## 2023-11-03 NOTE — Patient Instructions (Addendum)
 Start Protonix  once daily and cycle through the hiccup stopping maneuvers below. You may stop the Protonix  the day after your hiccups resolve.  Hiccup stopping maneuvers: -Breath holding for 5-10 seconds -Sipping or gurgling with ice water  -Biting into a lemon -Pulling on the tongue -Swallowing a teaspoon of dry, granulated sugar -Pressing gently, but firmly, on the eyeballs  Congratulations on your recent HOLEP procedure! As discussed in clinic today, these are the three normal postoperative findings after this surgery: Burning or pain with urination: This typically resolves within 1 week of surgery. If you are still having significant pain with urination 10 days after surgery, please call our clinic. We may need to check you for a urinary tract infection at that point, though this is rare. Blood in the urine: This may either come and go or steadily improve before going away, but typically resolves completely within 3 weeks of surgery. If you are on blood thinners, it may take longer for the bleeding to resolve. Please note that you may find that you pass clumps of tissue or debris around 10-14 days after surgery associated with some new bleeding. This is due to sloughing of your postoperative scab and is also normal. If at any point you start to pass dark red urine; thick, ketchup-like urine; or large blood clots around the size of your palm, please call our office immediately. Urinary leakage or urgency: This tends to improve with time, with most patients becoming dry within around 3 months of surgery. You may wear absorbant underwear or liners for security during this time. To help you get dry faster, please make sure you are completing your Kegel exercises as instructed, with a set of 10 exercises completed up to three times daily. Further information on Kegel exercises can be found in the back of this packet.

## 2023-11-05 DIAGNOSIS — R066 Hiccough: Secondary | ICD-10-CM

## 2023-11-06 MED ORDER — METOCLOPRAMIDE HCL 10 MG PO TABS: 10.0000 mg | ORAL_TABLET | Freq: Three times a day (TID) | ORAL | 0 refills | Status: DC

## 2023-11-10 MED ORDER — BACLOFEN 10 MG PO TABS: 10.0000 mg | ORAL_TABLET | Freq: Three times a day (TID) | ORAL | 0 refills | Status: DC

## 2023-11-10 NOTE — Addendum Note (Signed)
Addended by: Debarah Crape on: 11/10/2023 05:27 PM   Modules accepted: Orders

## 2023-12-04 ENCOUNTER — Ambulatory Visit (INDEPENDENT_AMBULATORY_CARE_PROVIDER_SITE_OTHER): Payer: Medicare Other | Admitting: Dermatology

## 2023-12-04 DIAGNOSIS — L820 Inflamed seborrheic keratosis: Secondary | ICD-10-CM

## 2023-12-04 DIAGNOSIS — D229 Melanocytic nevi, unspecified: Secondary | ICD-10-CM

## 2023-12-04 DIAGNOSIS — L814 Other melanin hyperpigmentation: Secondary | ICD-10-CM

## 2023-12-04 DIAGNOSIS — L219 Seborrheic dermatitis, unspecified: Secondary | ICD-10-CM

## 2023-12-04 DIAGNOSIS — L570 Actinic keratosis: Secondary | ICD-10-CM

## 2023-12-04 DIAGNOSIS — L578 Other skin changes due to chronic exposure to nonionizing radiation: Secondary | ICD-10-CM

## 2023-12-04 DIAGNOSIS — L853 Xerosis cutis: Secondary | ICD-10-CM

## 2023-12-04 DIAGNOSIS — W908XXA Exposure to other nonionizing radiation, initial encounter: Secondary | ICD-10-CM

## 2023-12-04 DIAGNOSIS — D1801 Hemangioma of skin and subcutaneous tissue: Secondary | ICD-10-CM

## 2023-12-04 DIAGNOSIS — L821 Other seborrheic keratosis: Secondary | ICD-10-CM

## 2023-12-04 DIAGNOSIS — Z1283 Encounter for screening for malignant neoplasm of skin: Secondary | ICD-10-CM

## 2023-12-04 MED ORDER — KETOCONAZOLE 2 % EX CREA
1.0000 | TOPICAL_CREAM | Freq: Two times a day (BID) | CUTANEOUS | 5 refills | Status: AC
Start: 2023-12-04 — End: 2024-01-15

## 2023-12-04 NOTE — Patient Instructions (Addendum)

## 2023-12-04 NOTE — Progress Notes (Signed)
Follow-Up Visit   Subjective  Mark Wyatt is a 73 y.o. male who presents for the following: Skin Cancer Screening and Upper Body Skin Exam, pt does have spot R side of face near eye, R forearm itchy, red.  The patient presents for Upper Body Skin Exam (UBSE) for skin cancer screening and mole check. The patient has spots, moles and lesions to be evaluated, some may be new or changing and the patient may have concern these could be cancer.    The following portions of the chart were reviewed this encounter and updated as appropriate: medications, allergies, medical history  Review of Systems:  No other skin or systemic complaints except as noted in HPI or Assessment and Plan.  Objective  Well appearing patient in no apparent distress; mood and affect are within normal limits.  All skin waist up examined. Relevant physical exam findings are noted in the Assessment and Plan.  R lateral canthus x1, Vertex scalp x1, nasal dorsum x1 (3) Pink scaly macules  4mm blanching pink scaly macule at nasal dorsum, treated today, recheck on f/up R forearm x1, L temporal hairline x1 (2) Stuck on waxy paps with erythema  Assessment & Plan   AK (ACTINIC KERATOSIS) (3) R lateral canthus x1, Vertex scalp x1, nasal dorsum x1 (3) Actinic keratoses are precancerous spots that appear secondary to cumulative UV radiation exposure/sun exposure over time. They are chronic with expected duration over 1 year. A portion of actinic keratoses will progress to squamous cell carcinoma of the skin. It is not possible to reliably predict which spots will progress to skin cancer and so treatment is recommended to prevent development of skin cancer.  Recommend daily broad spectrum sunscreen SPF 30+ to sun-exposed areas, reapply every 2 hours as needed.  Recommend staying in the shade or wearing long sleeves, sun glasses (UVA+UVB protection) and wide brim hats (4-inch brim around the entire circumference of the  hat). Call for new or changing lesions.  Ak vs Telangectasia at nasal dorsum,  recheck at follow up.  Destruction of lesion - R lateral canthus x1, Vertex scalp x1, nasal dorsum x1 (3)  Destruction method: cryotherapy   Informed consent: discussed and consent obtained   Lesion destroyed using liquid nitrogen: Yes   Region frozen until ice ball extended beyond lesion: Yes   Outcome: patient tolerated procedure well with no complications   Post-procedure details: wound care instructions given   Additional details:  Prior to procedure, discussed risks of blister formation, small wound, skin dyspigmentation, or rare scar following cryotherapy. Recommend Vaseline ointment to treated areas while healing.  INFLAMED SEBORRHEIC KERATOSIS (2) R forearm x1, L temporal hairline x1 (2) Symptomatic, irritating, patient would like treated. Destruction of lesion - R forearm x1, L temporal hairline x1 (2)  Destruction method: cryotherapy   Informed consent: discussed and consent obtained   Lesion destroyed using liquid nitrogen: Yes   Region frozen until ice ball extended beyond lesion: Yes   Outcome: patient tolerated procedure well with no complications   Post-procedure details: wound care instructions given   Additional details:  Prior to procedure, discussed risks of blister formation, small wound, skin dyspigmentation, or rare scar following cryotherapy. Recommend Vaseline ointment to treated areas while healing.   Skin cancer screening performed today.  Actinic Damage - Chronic condition, secondary to cumulative UV/sun exposure - diffuse scaly erythematous macules with underlying dyspigmentation - Recommend daily broad spectrum sunscreen SPF 30+ to sun-exposed areas, reapply every 2 hours as needed.  -  Staying in the shade or wearing long sleeves, sun glasses (UVA+UVB protection) and wide brim hats (4-inch brim around the entire circumference of the hat) are also recommended for sun protection.   - Call for new or changing lesions.  Lentigines, Seborrheic Keratoses, Hemangiomas - Benign normal skin lesions - Benign-appearing - Call for any changes  Melanocytic Nevi - Tan-brown and/or pink-flesh-colored symmetric macules and papules - Benign appearing on exam today - Observation - Call clinic for new or changing moles - Recommend daily use of broad spectrum spf 30+ sunscreen to sun-exposed areas.    Xerosis - diffuse xerotic patches at elbows - recommend gentle, hydrating skin care - gentle skin care handout given    SEBORRHEIC DERMATITIS Exam: Pink patches with greasy scale at nose, ears  Chronic and persistent condition with duration or expected duration over one year. Condition is bothersome/symptomatic for patient. Currently flared.   Seborrheic Dermatitis is a chronic persistent rash characterized by pinkness and scaling most commonly of the mid face but also can occur on the scalp (dandruff), ears; mid chest, mid back and groin.  It tends to be exacerbated by stress and cooler weather.  People who have neurologic disease may experience new onset or exacerbation of existing seborrheic dermatitis.  The condition is not curable but treatable and can be controlled.  Treatment Plan: Start ketoconazole 2% cream BID to aa face, ears    Return in about 1 year (around 12/03/2024) for , recheck nasal dorsum at follow up.  I, Soundra Pilon, CMA, am acting as scribe for Willeen Niece, MD .   Documentation: I have reviewed the above documentation for accuracy and completeness, and I agree with the above.  Willeen Niece, MD

## 2023-12-05 ENCOUNTER — Encounter: Payer: Self-pay | Admitting: Nurse Practitioner

## 2023-12-08 ENCOUNTER — Ambulatory Visit: Payer: Medicare Other | Admitting: Physician Assistant

## 2023-12-08 VITALS — BP 175/69 | HR 71 | Ht 70.5 in | Wt 202.0 lb

## 2023-12-08 DIAGNOSIS — R3915 Urgency of urination: Secondary | ICD-10-CM | POA: Diagnosis not present

## 2023-12-08 DIAGNOSIS — R300 Dysuria: Secondary | ICD-10-CM

## 2023-12-08 LAB — BLADDER SCAN AMB NON-IMAGING: Scan Result: 52

## 2023-12-08 MED ORDER — SULFAMETHOXAZOLE-TRIMETHOPRIM 800-160 MG PO TABS: 1.0000 | ORAL_TABLET | Freq: Two times a day (BID) | ORAL | 0 refills | Status: DC

## 2023-12-08 NOTE — Progress Notes (Unsigned)
 12/08/2023 4:25 PM   [name] 02-24-1951 [id number]  CC: Chief Complaint  Patient presents with   Urinary Frequency   HPI: Mark Wyatt is a 73 y.o. male with PMH BPH with urinary obstruction s/p HOLEP with Dr. Richardo Hanks on 10/31/2023 who presents today for evaluation of dysuria.   Today he reports painful throbbing along the perineum and urethra with urination since Foley removal postop.  He denies fevers or testicular swelling.  In-office UA today positive for 1+ glucose, 3+ blood, 2+ protein, and 1+ leukocytes; urine microscopy with >30 WBCs/HPF, >30 RBCs/HPF, and moderate bacteria. PVR 52mL.  PMH: Past Medical History:  Diagnosis Date   Anal fistula    a.) s/p anal fistulotomy   Anemia    Anginal pain (HCC)    Aortic atherosclerosis (HCC)    AVM (arteriovenous malformation) of duodenum    Bilateral carotid artery disease (HCC) 09/08/2020   a.) carotid doppler 09/08/2020: 50-70% RICA, < 50% LICA   Bilateral renal cysts    Coronary artery disease 02/24/2017   a.) LHC/PCI 02/24/2017: 95% dRCA-1 (2.5 x 38 mm Xience Alpine DES), 60% dRCA-2, 75% RPDA (2.5 x 38 mm Xience Alpine DES), 60% OM1, 70% p-mLCx, 70% D1, 65% pLAD   DDD (degenerative disc disease), cervical    DDD (degenerative disc disease), thoracolumbar    Duodenitis    Erectile dysfunction    a.) on PDE5i (tadalafil)   Esophagitis    Gastric polyp    GERD (gastroesophageal reflux disease)    Gout    History of bilateral cataract extraction 2023   Hyperlipidemia    Hypertension    NSTEMI (non-ST elevated myocardial infarction) (HCC) 02/21/2017   a,) troponins were trended: <0.03 --> 2.98 --> 4.93 ng/mL; b.) LHC/PCI 02/24/2017 --> 2.5 x 38 mm Xience Alpine DES x 2 to dRCA-1 and RPDA (non-overlapping)   Osteoarthritis    Overactive bladder    Perirectal abscess    RBBB (right bundle branch block)    Trigger finger of all digits of right hand    Type 2 diabetes, diet controlled (HCC)    Ventricular bigeminy      Surgical History: Past Surgical History:  Procedure Laterality Date   ANAL FISTULOTOMY     APPENDECTOMY     as a child   CATARACT EXTRACTION Right 02/2022   CATARACT EXTRACTION Left 03/2022   COLONOSCOPY WITH PROPOFOL N/A 02/13/2021   Procedure: COLONOSCOPY WITH PROPOFOL;  Surgeon: Toney Reil, MD;  Location: [facility] ENDOSCOPY;  Service: Gastroenterology;  Laterality: N/A;   CORONARY STENT INTERVENTION N/A 02/24/2017   Procedure: Coronary Stent Intervention;  Surgeon: Alwyn Pea, MD;  Location: [facility] INVASIVE CV LAB;  Service: Cardiovascular;  Laterality: N/A;   ESOPHAGOGASTRODUODENOSCOPY N/A 02/10/2021   Procedure: ESOPHAGOGASTRODUODENOSCOPY (EGD);  Surgeon: Wyline Mood, MD;  Location: [facility] ENDOSCOPY;  Service: Gastroenterology;  Laterality: N/A;   ESOPHAGOGASTRODUODENOSCOPY (EGD) WITH PROPOFOL N/A 01/25/2021   Procedure: ESOPHAGOGASTRODUODENOSCOPY (EGD) WITH PROPOFOL;  Surgeon: Pasty Spillers, MD;  Location: [facility] ENDOSCOPY;  Service: Endoscopy;  Laterality: N/A;   GIVENS CAPSULE STUDY N/A 02/21/2021   Procedure: GIVENS CAPSULE STUDY;  Surgeon: Pasty Spillers, MD;  Location: [facility] ENDOSCOPY;  Service: Endoscopy;  Laterality: N/A;   HOLEP-LASER ENUCLEATION OF THE PROSTATE WITH MORCELLATION N/A 10/31/2023   Procedure: HOLEP-LASER ENUCLEATION OF THE PROSTATE WITH MORCELLATION;  Surgeon: Sondra Come, MD;  Location: [facility] ORS;  Service: Urology;  Laterality: N/A;   LEFT HEART CATH AND CORONARY ANGIOGRAPHY N/A 02/24/2017   Procedure:  Left Heart Cath and Coronary Angiography;  Surgeon: Dalia Heading, MD;  Location: [facility] INVASIVE CV LAB;  Service: Cardiovascular;  Laterality: N/A;   TONSILLECTOMY     TRIGGER FINGER RELEASE Right 10/2021    Home Medications:  Allergies as of 12/08/2023       Reactions   Penicillins Other (See Comments)   Headache   Aspirin Other (See Comments)   02/05/2020-pt has been taking baby aspirin before bed and tolerating well.         Medication List        Accurate as of December 08, 2023  4:25 PM. If you have any questions, ask your nurse or doctor.          STOP taking these medications    baclofen 10 MG tablet Commonly known as: LIORESAL   HYDROcodone-acetaminophen 5-325 MG tablet Commonly known as: Norco   metoCLOPramide 10 MG tablet Commonly known as: REGLAN   metoprolol tartrate 25 MG tablet Commonly known as: LOPRESSOR   oxybutynin 10 MG 24 hr tablet Commonly known as: Ditropan XL   pantoprazole 20 MG tablet Commonly known as: PROTONIX       TAKE these medications    amLODipine 2.5 MG tablet Commonly known as: NORVASC Take 1 tablet (2.5 mg total) by mouth daily.   aspirin EC 81 MG tablet Take 81 mg by mouth at bedtime.   atorvastatin 40 MG tablet Commonly known as: LIPITOR TAKE 1 TABLET(40 MG) BY MOUTH DAILY AT 6 PM   ketoconazole 2 % cream Commonly known as: NIZORAL Apply 1 Application topically 2 (two) times daily. Apply BID to aa, face and ears   losartan 50 MG tablet Commonly known as: COZAAR TAKE 1 TABLET(50 MG) BY MOUTH TWICE DAILY What changed: See the new instructions.   metoprolol succinate 50 MG 24 hr tablet Commonly known as: TOPROL-XL Take 1 tablet (50 mg total) by mouth daily. What changed: when to take this        Allergies:  Allergies  Allergen Reactions   Penicillins Other (See Comments)    Headache   Aspirin Other (See Comments)    02/05/2020-pt has been taking baby aspirin before bed and tolerating well.    Family History: Family History  Problem Relation Age of Onset   Diabetes Mother     Social History:   reports that he has quit smoking. He has been exposed to tobacco smoke. He has never used smokeless tobacco. He reports current alcohol use of about 6.0 standard drinks of alcohol per week. He reports that he does not use drugs.  Physical Exam: BP (!) 175/69   Pulse 71   Ht 5' 10.5" (1.791 m)   Wt 202 lb (91.6 kg)   BMI 28.57  kg/m   Constitutional:  Alert and oriented, no acute distress, nontoxic appearing HEENT: [place], AT Cardiovascular: No clubbing, cyanosis, or edema Respiratory: Normal respiratory effort, no increased work of breathing GU: Bilateral descended testicles, no enlargement or tenderness of the testicles or epididymides Skin: No rashes, bruises or suspicious lesions Neurologic: Grossly intact, no focal deficits, moving all 4 extremities Psychiatric: Normal mood and affect  Laboratory Data: Results for orders placed or performed in visit on 12/08/23  Microscopic Examination   Collection Time: 12/08/23  3:56 PM   Urine  Result Value Ref Range   WBC, UA >30 (A) 0 - 5 /hpf   RBC, Urine >30 (A) 0 - 2 /hpf   Epithelial Cells (non renal) 0-10 0 -  10 /hpf   Casts Present (A) None seen /lpf   Cast Type Hyaline casts N/A   Bacteria, UA Moderate (A) None seen/Few  Urinalysis, Complete   Collection Time: 12/08/23  3:56 PM  Result Value Ref Range   Specific Gravity, UA >1.030 (H) 1.005 - 1.030   pH, UA 6.0 5.0 - 7.5   Color, UA Yellow Yellow   Appearance Ur Hazy (A) Clear   Leukocytes,UA 1+ (A) Negative   Protein,UA 2+ (A) Negative/Trace   Glucose, UA 1+ (A) Negative   Ketones, UA Negative Negative   RBC, UA 3+ (A) Negative   Bilirubin, UA Negative Negative   Urobilinogen, Ur 1.0 0.2 - 1.0 mg/dL   Nitrite, UA Negative Negative   Microscopic Examination See below:   Bladder Scan (Post Void Residual) in office   Collection Time: 12/08/23  4:13 PM  Result Value Ref Range   Scan Result 52 ml    Assessment & Plan:   1. Dysuria (Primary) UA appears grossly infected today, will start empiric Bactrim and send for culture for further evaluation.  He is emptying appropriately. - Urinalysis, Complete - Bladder Scan (Post Void Residual) in office - CULTURE, URINE COMPREHENSIVE - sulfamethoxazole-trimethoprim (BACTRIM DS) 800-160 MG tablet; Take 1 tablet by mouth 2 (two) times daily for 7 days.   Dispense: 14 tablet; Refill: 0   Return if symptoms worsen or fail to improve.  Carman Ching, PA-C  [facility] [place] [address], [address] [place], [place] [address] [phone]

## 2023-12-09 LAB — MICROSCOPIC EXAMINATION
RBC, Urine: >30 /[HPF] — AB (ref 0–2)
WBC, UA: >30 /[HPF] — AB (ref 0–5)

## 2023-12-09 LAB — URINALYSIS, COMPLETE
Bilirubin, UA: NEGATIVE
Ketones, UA: NEGATIVE
Nitrite, UA: NEGATIVE
Specific Gravity, UA: >1.03 — ABNORMAL HIGH (ref 1.005–1.030)
Urobilinogen, Ur: 1 mg/dL (ref 0.2–1.0)
pH, UA: 6 (ref 5.0–7.5)

## 2023-12-11 LAB — CULTURE, URINE COMPREHENSIVE

## 2023-12-12 ENCOUNTER — Other Ambulatory Visit: Payer: Self-pay | Admitting: Physician Assistant

## 2023-12-12 DIAGNOSIS — R300 Dysuria: Secondary | ICD-10-CM

## 2023-12-12 MED ORDER — URIBEL 118 MG PO CAPS
1.0000 | ORAL_CAPSULE | Freq: Four times a day (QID) | ORAL | 3 refills | Status: DC | PRN
Start: 2023-12-12 — End: 2024-01-14

## 2023-12-12 NOTE — Telephone Encounter (Signed)
 Called and spoke with patient to make him an appt  to come in to have an cysto with Dr Richardo Hanks . Had offer patient appt for next Thursday pt was upset that he had to wait that long to have something, pt requested for an appt for the [facility] made an appt  for next Wednesday 12/17/23 at 10am pt  didn't understand why, I explain that he wasn't in the office until those days.I did offer him the soonest opening. Pt was also was upset and cussing because he felt like he wasn't being taking care of in timely matter, and that he was in a lot pain. Pt was seen this week in clinic and pt's told Sam ,PA that he was pain he was seen with in 2 days of him calling. Pt had sent a my chart 12/11/23 and this was handled within couple of hours. Pt give an rx for Urobiel  and was to told to download the [facility] [employer] app. If he couldn't afford medication w/ using [facility] . He could AZO until he is seen on 12/17/23 per [place], [place]. Confirmed appt with patient. Pt and his wife stated that they both understood and was told follow up if he had any further concerns or issues.

## 2023-12-17 ENCOUNTER — Encounter: Payer: Self-pay | Admitting: Urology

## 2023-12-17 ENCOUNTER — Ambulatory Visit (INDEPENDENT_AMBULATORY_CARE_PROVIDER_SITE_OTHER): Payer: Medicare Other | Admitting: Urology

## 2023-12-17 VITALS — BP 132/67 | HR 88 | Ht 70.5 in | Wt 207.0 lb

## 2023-12-17 DIAGNOSIS — N99115 Postprocedural fossa navicularis urethral stricture: Secondary | ICD-10-CM

## 2023-12-17 MED ORDER — SULFAMETHOXAZOLE-TRIMETHOPRIM 800-160 MG PO TABS
1.0000 | ORAL_TABLET | Freq: Once | ORAL | Status: AC
  Administered 2023-12-17: 1 via ORAL

## 2023-12-17 NOTE — Patient Instructions (Addendum)
 Step 1 Get all of your supplies ready and place them near you. Step 2 Wash your hands with warm, soapy water or put on gloves. Step 3 Wash around the tip of your penis with warm, soapy water or a castille soap towelette. Step 4 Take catheter out of package and drain the lubricant over toilet. Step 5 Hold the penis at a 45 degree angle from the stomach in one hand and the catheter in the other hand. Step 6 Insert the catheter slowly into your urethra. You only need to pass it in about 2 inches  Do this every day until told otherwise

## 2023-12-17 NOTE — Progress Notes (Signed)
 Cystoscopy Procedure Note:  Indication: Dysuria  Bactrim was given for prophylaxis  Underwent HOLEP on 10/31/2023, initially did see some significant improvement in strength of stream and frequency/nocturia, but severe dysuria and frequency and nocturia the last few weeks.  After informed consent and discussion of the procedure and its risks, Davan Nawabi was positioned and prepped in the standard fashion.  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  A sensor wire was advanced through the stricture into the bladder.  Serial dilation was performed from 10 [place] to 24 [place] using Soil scientist.  He tolerated the procedure well.  The cystoscope was then advanced alongside the wire into the bladder.  The prostatic fossa was open consistent with prior HOLEP, no suspicious lesions.  Findings: Fossa navicularis stricture  Assessment and Plan: Instructed to perform self dilations once daily over the next 4 weeks, we will see him back in clinic at that time and consider spacing these to 3 times weekly and ultimately wean off catheterizing  Legrand Rams, MD 12/17/2023

## 2024-01-06 ENCOUNTER — Other Ambulatory Visit: Payer: Self-pay | Admitting: Physician Assistant

## 2024-01-06 DIAGNOSIS — R066 Hiccough: Secondary | ICD-10-CM

## 2024-01-07 ENCOUNTER — Ambulatory Visit: Payer: TRICARE For Life (TFL) | Admitting: Urology

## 2024-01-12 ENCOUNTER — Encounter: Payer: Self-pay | Admitting: Nurse Practitioner

## 2024-01-12 ENCOUNTER — Ambulatory Visit (INDEPENDENT_AMBULATORY_CARE_PROVIDER_SITE_OTHER): Payer: Medicare Other | Admitting: Nurse Practitioner

## 2024-01-12 VITALS — BP 136/74 | HR 79 | Temp 97.9°F | Resp 16 | Ht 70.5 in | Wt 212.8 lb

## 2024-01-12 DIAGNOSIS — I152 Hypertension secondary to endocrine disorders: Secondary | ICD-10-CM | POA: Diagnosis not present

## 2024-01-12 DIAGNOSIS — I700 Atherosclerosis of aorta: Secondary | ICD-10-CM

## 2024-01-12 DIAGNOSIS — E1159 Type 2 diabetes mellitus with other circulatory complications: Secondary | ICD-10-CM

## 2024-01-12 LAB — POCT GLYCOSYLATED HEMOGLOBIN (HGB A1C): Hemoglobin A1C: 7.5 % — AB (ref 4.0–5.6)

## 2024-01-12 MED ORDER — ATORVASTATIN CALCIUM 40 MG PO TABS: ORAL_TABLET | ORAL | 3 refills | Status: AC

## 2024-01-12 MED ORDER — METOPROLOL SUCCINATE ER 50 MG PO TB24: 50.0000 mg | ORAL_TABLET | Freq: Every evening | ORAL | 3 refills | Status: DC

## 2024-01-12 MED ORDER — AMLODIPINE BESYLATE 2.5 MG PO TABS: 2.5000 mg | ORAL_TABLET | Freq: Every day | ORAL | 1 refills | Status: DC

## 2024-01-12 NOTE — Progress Notes (Signed)
 [facility] [address] [place], [place] [address]  Internal MEDICINE  Office Visit Note  Patient Name: Mark Wyatt  [id number]  [id number]  Date of Service: 01/12/2024  Chief Complaint  Patient presents with   Diabetes   Gastroesophageal Reflux   Hyperlipidemia   Hypertension   Follow-up    HPI Jerran presents for a follow-up visit for diabetes, high cholesterol and hypertension.  Diabetes -- A1c is elevated at 7.5, highest that he has been. Had a surgical procedure recently and has not been moving as much recently.  Urethral strictures -- sees Dr. Richardo Hanks this week.  Hiccups after procedure after anesthesia -- resolved High cholesterol --taking atorvastatin Hypertension -- controlled with losartan, metoprolol succinate, and amlodipine    Current Medication: Outpatient Encounter Medications as of 01/12/2024  Medication Sig   aspirin EC 81 MG tablet Take 81 mg by mouth at bedtime.   ketoconazole (NIZORAL) 2 % cream Apply 1 Application topically 2 (two) times daily. Apply BID to aa, face and ears   losartan (COZAAR) 50 MG tablet TAKE 1 TABLET(50 MG) BY MOUTH TWICE DAILY (Patient taking differently: Take 100 mg by mouth at bedtime.)   Meth-Hyo-M Bl-Na Phos-Ph Sal (URIBEL) 118 MG CAPS Take 1 capsule (118 mg total) by mouth 4 (four) times daily as needed.   [DISCONTINUED] amLODipine (NORVASC) 2.5 MG tablet Take 1 tablet (2.5 mg total) by mouth daily.   [DISCONTINUED] atorvastatin (LIPITOR) 40 MG tablet TAKE 1 TABLET(40 MG) BY MOUTH DAILY AT 6 PM   [DISCONTINUED] metoprolol succinate (TOPROL-XL) 50 MG 24 hr tablet Take 1 tablet (50 mg total) by mouth daily. (Patient taking differently: Take 50 mg by mouth at bedtime.)   amLODipine (NORVASC) 2.5 MG tablet Take 1 tablet (2.5 mg total) by mouth daily.   atorvastatin (LIPITOR) 40 MG tablet TAKE 1 TABLET(40 MG) BY MOUTH DAILY AT 6 PM   metoprolol succinate (TOPROL-XL) 50 MG 24 hr tablet Take 1 tablet (50 mg total) by mouth  at bedtime.   No facility-administered encounter medications on file as of 01/12/2024.    Surgical History: Past Surgical History:  Procedure Laterality Date   ANAL FISTULOTOMY     APPENDECTOMY     as a child   CATARACT EXTRACTION Right 02/2022   CATARACT EXTRACTION Left 03/2022   COLONOSCOPY WITH PROPOFOL N/A 02/13/2021   Procedure: COLONOSCOPY WITH PROPOFOL;  Surgeon: Toney Reil, MD;  Location: [facility] ENDOSCOPY;  Service: Gastroenterology;  Laterality: N/A;   CORONARY STENT INTERVENTION N/A 02/24/2017   Procedure: Coronary Stent Intervention;  Surgeon: Alwyn Pea, MD;  Location: [facility] INVASIVE CV LAB;  Service: Cardiovascular;  Laterality: N/A;   ESOPHAGOGASTRODUODENOSCOPY N/A 02/10/2021   Procedure: ESOPHAGOGASTRODUODENOSCOPY (EGD);  Surgeon: Wyline Mood, MD;  Location: [facility] ENDOSCOPY;  Service: Gastroenterology;  Laterality: N/A;   ESOPHAGOGASTRODUODENOSCOPY (EGD) WITH PROPOFOL N/A 01/25/2021   Procedure: ESOPHAGOGASTRODUODENOSCOPY (EGD) WITH PROPOFOL;  Surgeon: Pasty Spillers, MD;  Location: [facility] ENDOSCOPY;  Service: Endoscopy;  Laterality: N/A;   GIVENS CAPSULE STUDY N/A 02/21/2021   Procedure: GIVENS CAPSULE STUDY;  Surgeon: Pasty Spillers, MD;  Location: [facility] ENDOSCOPY;  Service: Endoscopy;  Laterality: N/A;   HOLEP-LASER ENUCLEATION OF THE PROSTATE WITH MORCELLATION N/A 10/31/2023   Procedure: HOLEP-LASER ENUCLEATION OF THE PROSTATE WITH MORCELLATION;  Surgeon: Sondra Come, MD;  Location: [facility] ORS;  Service: Urology;  Laterality: N/A;   LEFT HEART CATH AND CORONARY ANGIOGRAPHY N/A 02/24/2017   Procedure: Left Heart Cath and Coronary Angiography;  Surgeon: Harold Hedge  A, MD;  Location: [facility] INVASIVE CV LAB;  Service: Cardiovascular;  Laterality: N/A;   TONSILLECTOMY     TRIGGER FINGER RELEASE Right 10/2021    Medical History: Past Medical History:  Diagnosis Date   Anal fistula    a.) s/p anal fistulotomy   Anemia    Anginal pain (HCC)     Aortic atherosclerosis (HCC)    AVM (arteriovenous malformation) of duodenum    Bilateral carotid artery disease (HCC) 09/08/2020   a.) carotid doppler 09/08/2020: 50-70% RICA, < 50% LICA   Bilateral renal cysts    Coronary artery disease 02/24/2017   a.) LHC/PCI 02/24/2017: 95% dRCA-1 (2.5 x 38 mm Xience Alpine DES), 60% dRCA-2, 75% RPDA (2.5 x 38 mm Xience Alpine DES), 60% OM1, 70% p-mLCx, 70% D1, 65% pLAD   DDD (degenerative disc disease), cervical    DDD (degenerative disc disease), thoracolumbar    Duodenitis    Erectile dysfunction    a.) on PDE5i (tadalafil)   Esophagitis    Gastric polyp    GERD (gastroesophageal reflux disease)    Gout    History of bilateral cataract extraction 2023   Hyperlipidemia    Hypertension    NSTEMI (non-ST elevated myocardial infarction) (HCC) 02/21/2017   a,) troponins were trended: <0.03 --> 2.98 --> 4.93 ng/mL; b.) LHC/PCI 02/24/2017 --> 2.5 x 38 mm Xience Alpine DES x 2 to dRCA-1 and RPDA (non-overlapping)   Osteoarthritis    Overactive bladder    Perirectal abscess    RBBB (right bundle branch block)    Trigger finger of all digits of right hand    Type 2 diabetes, diet controlled (HCC)    Ventricular bigeminy     Family History: Family History  Problem Relation Age of Onset   Diabetes Mother     Social History   Socioeconomic History   Marital status: Married    Spouse name: Drenda Freeze   Number of children: Not on file   Years of education: Not on file   Highest education level: Not on file  Occupational History   Not on file  Tobacco Use   Smoking status: Former    Passive exposure: Past   Smokeless tobacco: Never  Vaping Use   Vaping status: Never Used  Substance and Sexual Activity   Alcohol use: Yes    Alcohol/week: 6.0 standard drinks of alcohol    Types: 6 Cans of beer per week    Comment: 1 or 2 with dinner   Drug use: No   Sexual activity: Not on file  Other Topics Concern   Not on file  Social History Narrative    Not on file   Social Drivers of Health   Financial Resource Strain: Not on file  Food Insecurity: Not on file  Transportation Needs: Not on file  Physical Activity: Not on file  Stress: Not on file  Social Connections: Unknown (02/18/2022)   Received from [facility], [facility]   Social Network    Social Network: Not on file  Intimate Partner Violence: Unknown (01/22/2022)   Received from [facility], [facility]   HITS    Physically Hurt: Not on file    Insult or Talk Down To: Not on file    Threaten Physical Harm: Not on file    Scream or Curse: Not on file      Review of Systems  Constitutional:  Negative for chills, fatigue and unexpected weight change.  HENT:  Negative for congestion, rhinorrhea, sneezing  and sore throat.   Eyes:  Negative for redness.  Respiratory: Negative.  Negative for cough, chest tightness, shortness of breath and wheezing.   Cardiovascular: Negative.  Negative for chest pain and palpitations.  Gastrointestinal:  Negative for abdominal pain, constipation, diarrhea, nausea and vomiting.  Genitourinary:  Negative for dysuria and frequency.  Musculoskeletal:  Negative for back pain, joint swelling and neck pain.  Skin:  Negative for rash.  Neurological: Negative.  Negative for tremors and numbness.  Hematological:  Negative for adenopathy. Does not bruise/bleed easily.  Psychiatric/Behavioral:  Negative for behavioral problems (Depression), sleep disturbance and suicidal ideas. The patient is not nervous/anxious.     Vital Signs: BP 136/74   Pulse 79   Temp 97.9 F (36.6 C)   Resp 16   Ht 5' 10.5" (1.791 m)   Wt 212 lb 12.8 oz (96.5 kg)   SpO2 95%   BMI 30.10 kg/m    Physical Exam Vitals reviewed.  Constitutional:      General: He is not in acute distress.    Appearance: Normal appearance. He is obese. He is not ill-appearing.  HENT:     Head: Normocephalic and atraumatic.  Eyes:     Pupils: Pupils are equal, round, and  reactive to light.  Cardiovascular:     Rate and Rhythm: Normal rate and regular rhythm.  Pulmonary:     Effort: Pulmonary effort is normal. No respiratory distress.  Neurological:     Mental Status: He is alert and oriented to person, place, and time.  Psychiatric:        Mood and Affect: Mood normal.        Behavior: Behavior normal.        Assessment/Plan: 1. Type 2 diabetes mellitus with other circulatory complication, without long-term current use of insulin (HCC) (Primary) A1c is elevated at 7.5. patient declined to start any medication at this time. He wants to try improving A1c with diet and physical activity. - POCT glycosylated hemoglobin (Hb A1C)  2. Hypertension associated with diabetes (HCC) Stable, continue amlodipine and metoprolol as prescribed.  - metoprolol succinate (TOPROL-XL) 50 MG 24 hr tablet; Take 1 tablet (50 mg total) by mouth at bedtime.  Dispense: 90 tablet; Refill: 3 - amLODipine (NORVASC) 2.5 MG tablet; Take 1 tablet (2.5 mg total) by mouth daily.  Dispense: 90 tablet; Refill: 1  3. Aortic atherosclerosis (HCC) Continue atorvastatin as prescribed.  - atorvastatin (LIPITOR) 40 MG tablet; TAKE 1 TABLET(40 MG) BY MOUTH DAILY AT 6 PM  Dispense: 90 tablet; Refill: 3   General Counseling: Wendelin verbalizes understanding of the findings of todays visit and agrees with plan of treatment. I have discussed any further diagnostic evaluation that may be needed or ordered today. We also reviewed his medications today. he has been encouraged to call the office with any questions or concerns that should arise related to todays visit.    Orders Placed This Encounter  Procedures   POCT glycosylated hemoglobin (Hb A1C)    Meds ordered this encounter  Medications   metoprolol succinate (TOPROL-XL) 50 MG 24 hr tablet    Sig: Take 1 tablet (50 mg total) by mouth at bedtime.    Dispense:  90 tablet    Refill:  3   amLODipine (NORVASC) 2.5 MG tablet    Sig: Take  1 tablet (2.5 mg total) by mouth daily.    Dispense:  90 tablet    Refill:  1   atorvastatin (LIPITOR) 40 MG tablet  Sig: TAKE 1 TABLET(40 MG) BY MOUTH DAILY AT 6 PM    Dispense:  90 tablet    Refill:  3    Return in about 4 months (around 05/20/2024) for F/U, Recheck A1C, Shylie Polo PCP.   Total time spent:30 Minutes Time spent includes review of chart, medications, test results, and follow up plan with the patient.   [place] Controlled Substance Database was reviewed by me.  This patient was seen by Sallyanne Kuster, FNP-C in collaboration with Dr. Beverely Risen as a part of collaborative care agreement.   Rashae Rother R. Tedd Sias, MSN, FNP-C Internal medicine

## 2024-01-14 ENCOUNTER — Ambulatory Visit (INDEPENDENT_AMBULATORY_CARE_PROVIDER_SITE_OTHER): Payer: TRICARE For Life (TFL) | Admitting: Urology

## 2024-01-14 VITALS — BP 144/76 | HR 58 | Ht 70.0 in | Wt 212.0 lb

## 2024-01-14 DIAGNOSIS — N401 Enlarged prostate with lower urinary tract symptoms: Secondary | ICD-10-CM

## 2024-01-14 DIAGNOSIS — N99115 Postprocedural fossa navicularis urethral stricture: Secondary | ICD-10-CM

## 2024-01-14 DIAGNOSIS — N138 Other obstructive and reflux uropathy: Secondary | ICD-10-CM

## 2024-01-14 LAB — BLADDER SCAN AMB NON-IMAGING

## 2024-01-14 NOTE — Progress Notes (Signed)
   01/14/2024 11:16 AM   [name] [birth year] [id number]  Reason for visit: Follow up BPH status post HOLEP, fossa navicularis stricture  HPI: 73 year old male with long history of worsening urinary symptoms with urgency, frequency, sensation of incomplete emptying, and nocturia 5-8 times overnight.  He had no improvement with both BPH and OAB medications, PSA was normal, urodynamic showed obstructed pattern with incomplete emptying, and he opted for HOLEP.  He underwent HOLEP on 10/31/2023 with removal of 30 g of benign prostate tissue.  He initially had significant improvement in his urinary symptoms but developed severe dysuria, frequency, and nocturia.  Cystoscopy on 12/17/2023 showed a dense pinpoint fossa navicularis stricture, and serial dilation was performed over a wire using Hayman dilators to 24 [place].  Cystoscopy was performed showing an open prostatic fossa and no concerning bladder findings.  He has been performing self dilations with catheter once daily over the last 4 weeks.  Overall, he is doing extremely well.  He is having some minimal postvoid dribbling leakage that is not particularly bothersome and using a small pad.  Urgency, frequency has improved significantly, and strength of his stream is excellent.  He is having nocturia 1-2 times overnight which is improved significantly from prior.  He missed a day or 2 of passing the catheter and noticed some difficulty passing at the next time.  I recommended continuing the daily self dilations of the fossa navicularis stricture for the next month, and at that time can consider spacing 2-3 times weekly if doing well.  If no improvement with self dilations could also consider Optilume of fossa navicularis stricture.  Continue self urethral dilations daily x 4 weeks, RTC 4 weeks to consider spacing out  Sondra Come, MD  [facility] [address] [place], [place] [address] [phone]

## 2024-02-11 ENCOUNTER — Ambulatory Visit (INDEPENDENT_AMBULATORY_CARE_PROVIDER_SITE_OTHER): Admitting: Urology

## 2024-02-11 ENCOUNTER — Other Ambulatory Visit
Admission: RE | Admit: 2024-02-11 | Discharge: 2024-02-11 | Disposition: A | Discharge status: Home / Self Care | Attending: Urology | Admitting: Urology

## 2024-02-11 VITALS — BP 120/67 | HR 60 | Ht 70.0 in | Wt 212.0 lb

## 2024-02-11 DIAGNOSIS — N529 Male erectile dysfunction, unspecified: Secondary | ICD-10-CM | POA: Diagnosis not present

## 2024-02-11 DIAGNOSIS — R300 Dysuria: Secondary | ICD-10-CM

## 2024-02-11 DIAGNOSIS — R399 Unspecified symptoms and signs involving the genitourinary system: Secondary | ICD-10-CM

## 2024-02-11 LAB — URINALYSIS, COMPLETE (UACMP) WITH MICROSCOPIC
Bilirubin Urine: NEGATIVE
Glucose, UA: NEGATIVE mg/dL
Ketones, ur: NEGATIVE mg/dL
Leukocytes,Ua: NEGATIVE
Nitrite: NEGATIVE
Specific Gravity, Urine: 1.02 (ref 1.005–1.030)
Squamous Epithelial / HPF: NONE SEEN /HPF (ref 0–5)
WBC, UA: NONE SEEN WBC/hpf (ref 0–5)
pH: 6 (ref 5.0–8.0)

## 2024-02-11 MED ORDER — TADALAFIL 5 MG PO TABS: 5.0000 mg | ORAL_TABLET | Freq: Every day | ORAL | 6 refills | Status: DC | PRN

## 2024-02-11 NOTE — Progress Notes (Signed)
   02/11/2024 12:43 PM   Mark Wyatt [birth year] [id number]  Reason for visit: Follow up BPH status post HOLEP, fossa navicularis stricture, new ED  HPI: 73 year old male with long history of worsening urinary symptoms with urgency, frequency, sensation of incomplete emptying, and nocturia 5-8 times overnight.  He had no improvement with both BPH and OAB medications, PSA was normal, urodynamic showed obstructed pattern with incomplete emptying, and he opted for HOLEP.  He underwent HOLEP on 10/31/2023 with removal of 30 g of benign prostate tissue.He initially had significant improvement in his urinary symptoms but developed severe dysuria, frequency, and nocturia.  Cystoscopy on 12/17/2023 showed a dense pinpoint fossa navicularis stricture, and serial dilation was performed over a wire using Hayman dilators to 24 [place].  Cystoscopy was performed showing an open prostatic fossa and no concerning bladder findings.  He had been performing self dilations of the distal urethra daily over the last month, over the last 2 weeks he has not felt the need to catheterize and continues to void well.  Overall, he is doing extremely well.  He is having some mild frequency and urgency but no significant leakage.  He is having nocturia 1-2 times overnight which is improved significantly from prior.  Urinalysis today with no evidence of infection  He does report increased problems with erections and is interested in trying medications at this point.  Risk and benefits of Cialis  were discussed  Trial of Cialis  5 to 20 mg on demand for ED RTC 6 months PVR  [name], MD  [facility] [address] [place], [place] [address] [phone]

## 2024-02-12 LAB — URINE CULTURE: Culture: NO GROWTH

## 2024-02-26 ENCOUNTER — Ambulatory Visit: Payer: Self-pay | Admitting: Urology

## 2024-05-06 ENCOUNTER — Other Ambulatory Visit: Payer: Self-pay | Admitting: Nurse Practitioner

## 2024-05-06 DIAGNOSIS — I10 Essential (primary) hypertension: Secondary | ICD-10-CM

## 2024-05-17 ENCOUNTER — Ambulatory Visit (INDEPENDENT_AMBULATORY_CARE_PROVIDER_SITE_OTHER): Admitting: Nurse Practitioner

## 2024-05-17 ENCOUNTER — Encounter: Payer: Self-pay | Admitting: Nurse Practitioner

## 2024-05-17 VITALS — BP 110/80 | HR 59 | Temp 97.8°F | Resp 16 | Ht 71.0 in | Wt 211.0 lb

## 2024-05-17 DIAGNOSIS — N281 Cyst of kidney, acquired: Secondary | ICD-10-CM | POA: Insufficient documentation

## 2024-05-17 DIAGNOSIS — E1159 Type 2 diabetes mellitus with other circulatory complications: Secondary | ICD-10-CM | POA: Diagnosis not present

## 2024-05-17 DIAGNOSIS — D500 Iron deficiency anemia secondary to blood loss (chronic): Secondary | ICD-10-CM | POA: Diagnosis not present

## 2024-05-17 DIAGNOSIS — Z955 Presence of coronary angioplasty implant and graft: Secondary | ICD-10-CM | POA: Insufficient documentation

## 2024-05-17 DIAGNOSIS — E041 Nontoxic single thyroid nodule: Secondary | ICD-10-CM | POA: Insufficient documentation

## 2024-05-17 DIAGNOSIS — E1169 Type 2 diabetes mellitus with other specified complication: Secondary | ICD-10-CM | POA: Diagnosis not present

## 2024-05-17 DIAGNOSIS — E538 Deficiency of other specified B group vitamins: Secondary | ICD-10-CM

## 2024-05-17 DIAGNOSIS — I700 Atherosclerosis of aorta: Secondary | ICD-10-CM

## 2024-05-17 DIAGNOSIS — E559 Vitamin D deficiency, unspecified: Secondary | ICD-10-CM

## 2024-05-17 DIAGNOSIS — I152 Hypertension secondary to endocrine disorders: Secondary | ICD-10-CM

## 2024-05-17 DIAGNOSIS — E785 Hyperlipidemia, unspecified: Secondary | ICD-10-CM

## 2024-05-17 DIAGNOSIS — Z72821 Inadequate sleep hygiene: Secondary | ICD-10-CM | POA: Insufficient documentation

## 2024-05-17 DIAGNOSIS — N400 Enlarged prostate without lower urinary tract symptoms: Secondary | ICD-10-CM | POA: Insufficient documentation

## 2024-05-17 DIAGNOSIS — E119 Type 2 diabetes mellitus without complications: Secondary | ICD-10-CM | POA: Insufficient documentation

## 2024-05-17 LAB — POCT GLYCOSYLATED HEMOGLOBIN (HGB A1C): Hemoglobin A1C: 6.9 % — AB (ref 4.0–5.6)

## 2024-05-17 MED ORDER — AMLODIPINE BESYLATE 2.5 MG PO TABS: 2.5000 mg | ORAL_TABLET | Freq: Every day | ORAL | 1 refills | Status: DC

## 2024-05-17 NOTE — Progress Notes (Signed)
 [facility] [address] [place], [place] [address]  Internal MEDICINE  Office Visit Note  Patient Name: Mark Wyatt  [id number]  [id number]  Date of Service: 05/17/2024  Chief Complaint  Patient presents with   Follow-up   Diabetes   Gastroesophageal Reflux    Diabetes Pertinent negatives for hypoglycemia include no nervousness/anxiousness or tremors. Pertinent negatives for diabetes include no chest pain and no fatigue.  Gastroesophageal Reflux He reports no abdominal pain, no chest pain, no coughing, no nausea, no sore throat or no wheezing. Pertinent negatives include no fatigue.   Mark Wyatt presents for a follow-up visit for hypertension, diabetes and high cholesterol.  Hypertension -- well controlled with metoprolol , losartan  and amlodipine .  Diabetes -- A1c improved to 6.9 today, stable. Currently diet controlled, not on any medication.  High cholesterol -- taking atorvastatin   Scheduled for AWV on 12/3 but will be hunting regularly from October through December, wants to reschedule appt for early January. Due for routine labs for his AWV   Current Medication: Outpatient Encounter Medications as of 05/17/2024  Medication Sig   aspirin  EC 81 MG tablet Take 81 mg by mouth at bedtime.   atorvastatin  (LIPITOR) 40 MG tablet TAKE 1 TABLET(40 MG) BY MOUTH DAILY AT 6 PM   losartan  (COZAAR ) 50 MG tablet TAKE 1 TABLET(50 MG) BY MOUTH TWICE DAILY   metoprolol  succinate (TOPROL -XL) 50 MG 24 hr tablet Take 1 tablet (50 mg total) by mouth at bedtime.   tadalafil  (CIALIS ) 5 MG tablet Take 1-4 tablets (5-20 mg total) by mouth daily as needed for erectile dysfunction (take 1 hour prior to sexual activity).   [DISCONTINUED] amLODipine  (NORVASC ) 2.5 MG tablet Take 1 tablet (2.5 mg total) by mouth daily.   amLODipine  (NORVASC ) 2.5 MG tablet Take 1 tablet (2.5 mg total) by mouth daily.   No facility-administered encounter medications on file as of 05/17/2024.    Surgical  History: Past Surgical History:  Procedure Laterality Date   ANAL FISTULOTOMY     APPENDECTOMY     as a child   CATARACT EXTRACTION Right 02/2022   CATARACT EXTRACTION Left 03/2022   COLONOSCOPY WITH PROPOFOL  N/A 02/13/2021   Procedure: COLONOSCOPY WITH PROPOFOL ;  Surgeon: Unk Corinn Skiff, MD;  Location: [facility] ENDOSCOPY;  Service: Gastroenterology;  Laterality: N/A;   CORONARY STENT INTERVENTION N/A 02/24/2017   Procedure: Coronary Stent Intervention;  Surgeon: Florencio Cara BIRCH, MD;  Location: [facility] INVASIVE CV LAB;  Service: Cardiovascular;  Laterality: N/A;   ESOPHAGOGASTRODUODENOSCOPY N/A 02/10/2021   Procedure: ESOPHAGOGASTRODUODENOSCOPY (EGD);  Surgeon: Therisa Bi, MD;  Location: [facility] ENDOSCOPY;  Service: Gastroenterology;  Laterality: N/A;   ESOPHAGOGASTRODUODENOSCOPY (EGD) WITH PROPOFOL  N/A 01/25/2021   Procedure: ESOPHAGOGASTRODUODENOSCOPY (EGD) WITH PROPOFOL ;  Surgeon: Janalyn Keene NOVAK, MD;  Location: [facility] ENDOSCOPY;  Service: Endoscopy;  Laterality: N/A;   GIVENS CAPSULE STUDY N/A 02/21/2021   Procedure: GIVENS CAPSULE STUDY;  Surgeon: Janalyn Keene NOVAK, MD;  Location: [facility] ENDOSCOPY;  Service: Endoscopy;  Laterality: N/A;   HOLEP-LASER ENUCLEATION OF THE PROSTATE WITH MORCELLATION N/A 10/31/2023   Procedure: HOLEP-LASER ENUCLEATION OF THE PROSTATE WITH MORCELLATION;  Surgeon: Francisca Redell BROCKS, MD;  Location: [facility] ORS;  Service: Urology;  Laterality: N/A;   LEFT HEART CATH AND CORONARY ANGIOGRAPHY N/A 02/24/2017   Procedure: Left Heart Cath and Coronary Angiography;  Surgeon: Bosie Vinie LABOR, MD;  Location: [facility] INVASIVE CV LAB;  Service: Cardiovascular;  Laterality: N/A;   TONSILLECTOMY     TRIGGER FINGER RELEASE Right 10/2021    Medical  History: Past Medical History:  Diagnosis Date   Anal fistula    a.) s/p anal fistulotomy   Anemia    Anginal pain (HCC)    Aortic atherosclerosis (HCC)    AVM (arteriovenous malformation) of duodenum    Bilateral carotid  artery disease (HCC) 09/08/2020   a.) carotid doppler 09/08/2020: 50-70% RICA, < 50% LICA   Bilateral renal cysts    Coronary artery disease 02/24/2017   a.) LHC/PCI 02/24/2017: 95% dRCA-1 (2.5 x 38 mm Xience Alpine DES), 60% dRCA-2, 75% RPDA (2.5 x 38 mm Xience Alpine DES), 60% OM1, 70% p-mLCx, 70% D1, 65% pLAD   DDD (degenerative disc disease), cervical    DDD (degenerative disc disease), thoracolumbar    Duodenitis    Erectile dysfunction    a.) on PDE5i (tadalafil )   Esophagitis    Gastric polyp    GERD (gastroesophageal reflux disease)    Gout    History of bilateral cataract extraction 2023   Hyperlipidemia    Hypertension    NSTEMI (non-ST elevated myocardial infarction) (HCC) 02/21/2017   a,) troponins were trended: <0.03 --> 2.98 --> 4.93 ng/mL; b.) LHC/PCI 02/24/2017 --> 2.5 x 38 mm Xience Alpine DES x 2 to dRCA-1 and RPDA (non-overlapping)   Osteoarthritis    Overactive bladder    Perirectal abscess    RBBB (right bundle branch block)    Trigger finger of all digits of right hand    Type 2 diabetes, diet controlled (HCC)    Ventricular bigeminy     Family History: Family History  Problem Relation Age of Onset   Diabetes Mother     Social History   Socioeconomic History   Marital status: Married    Spouse name: Mark Wyatt   Number of children: Not on file   Years of education: Not on file   Highest education level: Not on file  Occupational History   Not on file  Tobacco Use   Smoking status: Former    Passive exposure: Past   Smokeless tobacco: Never  Vaping Use   Vaping status: Never Used  Substance and Sexual Activity   Alcohol use: Yes    Alcohol/week: 6.0 standard drinks of alcohol    Types: 6 Cans of beer per week    Comment: 1 or 2 with dinner   Drug use: No   Sexual activity: Not on file  Other Topics Concern   Not on file  Social History Narrative   Not on file   Social Drivers of Health   Financial Resource Strain: Not on file  Food  Insecurity: Not on file  Transportation Needs: Not on file  Physical Activity: Not on file  Stress: Not on file  Social Connections: Unknown (02/18/2022)   Received from [facility]   Social Network    Social Network: Not on file  Intimate Partner Violence: Unknown (01/22/2022)   Received from [facility]   HITS    Physically Hurt: Not on file    Insult or Talk Down To: Not on file    Threaten Physical Harm: Not on file    Scream or Curse: Not on file      Review of Systems  Constitutional:  Negative for chills, fatigue and unexpected weight change.  HENT:  Negative for congestion, rhinorrhea, sneezing and sore throat.   Eyes:  Negative for redness.  Respiratory: Negative.  Negative for cough, chest tightness, shortness of breath and wheezing.   Cardiovascular: Negative.  Negative for chest pain and  palpitations.  Gastrointestinal:  Negative for abdominal pain, constipation, diarrhea, nausea and vomiting.  Genitourinary:  Negative for dysuria and frequency.  Musculoskeletal:  Negative for back pain, joint swelling and neck pain.  Skin:  Negative for rash.  Neurological: Negative.  Negative for tremors and numbness.  Hematological:  Negative for adenopathy. Does not bruise/bleed easily.  Psychiatric/Behavioral:  Negative for behavioral problems (Depression), sleep disturbance and suicidal ideas. The patient is not nervous/anxious.     Vital Signs: BP 110/80   Pulse (!) 59   Temp 97.8 F (36.6 C)   Resp 16   Ht 5' 11 (1.803 m)   Wt 211 lb (95.7 kg)   SpO2 97%   BMI 29.43 kg/m    Physical Exam Vitals reviewed.  Constitutional:      General: He is not in acute distress.    Appearance: Normal appearance. He is obese. He is not ill-appearing.  HENT:     Head: Normocephalic and atraumatic.  Eyes:     Pupils: Pupils are equal, round, and reactive to light.  Cardiovascular:     Rate and Rhythm: Normal rate and regular rhythm.  Pulmonary:     Effort: Pulmonary effort  is normal. No respiratory distress.  Neurological:     Mental Status: He is alert and oriented to person, place, and time.  Psychiatric:        Mood and Affect: Mood normal.        Behavior: Behavior normal.        Assessment/Plan: 1. Type 2 diabetes mellitus with other specified complication, without long-term current use of insulin  (HCC) (Primary) A1c is stable at 6.9 today, improved from 7.5. urine sent for microalbumin/creatinine ratio. Routine labs ordered.  - POCT HgB A1C - Urine Microalbumin w/creat. ratio - CBC with Differential/Platelet - CMP14+EGFR - Lipid Profile - Vitamin D  (25 hydroxy) - B12 and Folate Panel - Iron, TIBC and Ferritin Panel - TSH + free T4  2. Hypertension associated with diabetes (HCC) Stable, continue metoprolol , amlodipine  and losartan  as prescribed. Routine labs ordered  - amLODipine  (NORVASC ) 2.5 MG tablet; Take 1 tablet (2.5 mg total) by mouth daily.  Dispense: 90 tablet; Refill: 1 - CBC with Differential/Platelet - CMP14+EGFR - Lipid Profile - Vitamin D  (25 hydroxy) - B12 and Folate Panel - Iron, TIBC and Ferritin Panel - TSH + free T4  3. Hyperlipidemia associated with type 2 diabetes mellitus (HCC) Continue atorvastatin  as prescribed. Routine labs ordered  - CBC with Differential/Platelet - CMP14+EGFR - Lipid Profile - Vitamin D  (25 hydroxy) - B12 and Folate Panel - Iron, TIBC and Ferritin Panel - TSH + free T4  4. Aortic atherosclerosis (HCC) Routine labs ordered  - CBC with Differential/Platelet - CMP14+EGFR - Lipid Profile - Vitamin D  (25 hydroxy) - B12 and Folate Panel - Iron, TIBC and Ferritin Panel - TSH + free T4  5. Iron deficiency anemia due to chronic blood loss Routine labs ordered  - CBC with Differential/Platelet - CMP14+EGFR - Lipid Profile - Vitamin D  (25 hydroxy) - B12 and Folate Panel - Iron, TIBC and Ferritin Panel - TSH + free T4  6. B12 deficiency Routine labs ordered  - CBC with  Differential/Platelet - CMP14+EGFR - Lipid Profile - Vitamin D  (25 hydroxy) - B12 and Folate Panel - Iron, TIBC and Ferritin Panel - TSH + free T4  7. Vitamin D  deficiency Routine labs ordered.  - CBC with Differential/Platelet - CMP14+EGFR - Lipid Profile - Vitamin D  (25 hydroxy) - B12  and Folate Panel - Iron, TIBC and Ferritin Panel - TSH + free T4   General Counseling: Tripton verbalizes understanding of the findings of todays visit and agrees with plan of treatment. I have discussed any further diagnostic evaluation that may be needed or ordered today. We also reviewed his medications today. he has been encouraged to call the office with any questions or concerns that should arise related to todays visit.    Orders Placed This Encounter  Procedures   Urine Microalbumin w/creat. ratio   CBC with Differential/Platelet   CMP14+EGFR   Lipid Profile   Vitamin D  (25 hydroxy)   B12 and Folate Panel   Iron, TIBC and Ferritin Panel   TSH + free T4   POCT HgB A1C    Meds ordered this encounter  Medications   amLODipine  (NORVASC ) 2.5 MG tablet    Sig: Take 1 tablet (2.5 mg total) by mouth daily.    Dispense:  90 tablet    Refill:  1    Return in about 4 months (around 09/17/2024) for reschedule 12/3 appt for early january due to hunting schedule.   Total time spent:30 Minutes Time spent includes review of chart, medications, test results, and follow up plan with the patient.   [place] Controlled Substance Database was reviewed by me.  This patient was seen by Mardy Maxin, FNP-C in collaboration with Dr. Sigrid Bathe as a part of collaborative care agreement.   Charlise Giovanetti R. Maxin, MSN, FNP-C Internal medicine

## 2024-05-18 LAB — MICROALBUMIN / CREATININE URINE RATIO
Creatinine, Urine: 264.4 mg/dL
Microalb/Creat Ratio: 15 mg/g{creat} (ref 0–29)
Microalbumin, Urine: 38.7 ug/mL

## 2024-08-03 ENCOUNTER — Ambulatory Visit (INDEPENDENT_AMBULATORY_CARE_PROVIDER_SITE_OTHER): Admitting: Urology

## 2024-08-03 VITALS — BP 150/72 | HR 57 | Wt 205.0 lb

## 2024-08-03 DIAGNOSIS — N401 Enlarged prostate with lower urinary tract symptoms: Secondary | ICD-10-CM

## 2024-08-03 DIAGNOSIS — N138 Other obstructive and reflux uropathy: Secondary | ICD-10-CM

## 2024-08-03 DIAGNOSIS — R399 Unspecified symptoms and signs involving the genitourinary system: Secondary | ICD-10-CM | POA: Diagnosis not present

## 2024-08-03 DIAGNOSIS — N529 Male erectile dysfunction, unspecified: Secondary | ICD-10-CM | POA: Diagnosis not present

## 2024-08-03 LAB — BLADDER SCAN AMB NON-IMAGING

## 2024-08-03 MED ORDER — SILDENAFIL CITRATE 100 MG PO TABS: 100.0000 mg | ORAL_TABLET | Freq: Every day | ORAL | 6 refills | Status: DC | PRN

## 2024-08-03 NOTE — Progress Notes (Signed)
   08/03/2024 8:15 AM   Mark Wyatt Dec 21, 1950 [id number]  Reason for visit: Follow up BPH status post HOLEP, fossa navicularis stricture, ED  History: Long history of worsening urinary symptoms with urgency, frequency, sensation of incomplete emptying, nocturia 5-8 times overnight, had no improvement on BPH or OAB medications, PSA was normal, urodynamic showed obstructive pattern with incomplete emptying and ultimately underwent HOLEP January 2025 with benign tissue.   Developed dense pinpoint fossa navicularis stricture and was dilated in clinic February 2025, performed self dilations of distal urethra for 6 weeks Started on Cialis  5 to 20 mg on demand in April 2025   Today: PVR today normal at 8ml Doing very well from a urinary perspective, good stream, minimal urge leakage, nocturia 0-2 times overnight, overall very satisfied.  Cialis  has not worked very well for ED, only works occasionally.  He would like to try the sildenafil .  His wife has some other health problems so they are not as active recently  Plan:   BPH: Doing well status post HOLEP, urinary symptoms significantly improved, can continue yearly PVR ED: Cialis  changed to sildenafil  100 mg on demand RTC 1 year PVR, consider testosterone in the future if patient interested   Mark JAYSON Burnet, MD  [facility] [address] [place], [place] [address] [phone]

## 2024-09-08 ENCOUNTER — Ambulatory Visit
Admission: RE | Admit: 2024-09-08 | Discharge: 2024-09-08 | Disposition: A | Discharge status: Home / Self Care | Attending: Internal Medicine | Admitting: Internal Medicine

## 2024-09-08 ENCOUNTER — Other Ambulatory Visit: Payer: Self-pay

## 2024-09-08 ENCOUNTER — Encounter: Payer: Self-pay | Admitting: Internal Medicine

## 2024-09-08 ENCOUNTER — Encounter
Admission: RE | Disposition: A | Discharge status: Home / Self Care | Payer: Self-pay | Source: Home / Self Care | Attending: Internal Medicine

## 2024-09-08 DIAGNOSIS — Z79899 Other long term (current) drug therapy: Secondary | ICD-10-CM | POA: Diagnosis not present

## 2024-09-08 DIAGNOSIS — E785 Hyperlipidemia, unspecified: Secondary | ICD-10-CM | POA: Insufficient documentation

## 2024-09-08 DIAGNOSIS — R9439 Abnormal result of other cardiovascular function study: Secondary | ICD-10-CM | POA: Diagnosis present

## 2024-09-08 DIAGNOSIS — Z955 Presence of coronary angioplasty implant and graft: Secondary | ICD-10-CM | POA: Insufficient documentation

## 2024-09-08 DIAGNOSIS — I700 Atherosclerosis of aorta: Secondary | ICD-10-CM | POA: Insufficient documentation

## 2024-09-08 DIAGNOSIS — Z87891 Personal history of nicotine dependence: Secondary | ICD-10-CM | POA: Diagnosis not present

## 2024-09-08 DIAGNOSIS — I252 Old myocardial infarction: Secondary | ICD-10-CM | POA: Diagnosis not present

## 2024-09-08 DIAGNOSIS — I2584 Coronary atherosclerosis due to calcified coronary lesion: Secondary | ICD-10-CM | POA: Diagnosis not present

## 2024-09-08 DIAGNOSIS — I10 Essential (primary) hypertension: Secondary | ICD-10-CM | POA: Insufficient documentation

## 2024-09-08 DIAGNOSIS — Z7982 Long term (current) use of aspirin: Secondary | ICD-10-CM | POA: Diagnosis not present

## 2024-09-08 DIAGNOSIS — I2510 Atherosclerotic heart disease of native coronary artery without angina pectoris: Secondary | ICD-10-CM | POA: Diagnosis not present

## 2024-09-08 HISTORY — PX: LEFT HEART CATH AND CORONARY ANGIOGRAPHY: CATH118249

## 2024-09-08 LAB — CBC
HCT: 46 % (ref 39.0–52.0)
Hemoglobin: 15.4 g/dL (ref 13.0–17.0)
MCH: 28.7 pg (ref 26.0–34.0)
MCHC: 33.5 g/dL (ref 30.0–36.0)
MCV: 85.8 fL (ref 80.0–100.0)
Platelets: 258 K/uL (ref 150–400)
RBC: 5.36 MIL/uL (ref 4.22–5.81)
RDW: 12.3 % (ref 11.5–15.5)
WBC: 6.6 K/uL (ref 4.0–10.5)
nRBC: 0 % (ref 0.0–0.2)

## 2024-09-08 LAB — BASIC METABOLIC PANEL WITH GFR
Anion gap: 12 (ref 5–15)
BUN: 13 mg/dL (ref 8–23)
CO2: 24 mmol/L (ref 22–32)
Calcium: 9.6 mg/dL (ref 8.9–10.3)
Chloride: 102 mmol/L (ref 98–111)
Creatinine, Ser: 0.9 mg/dL (ref 0.61–1.24)
GFR, Estimated: >60 mL/min (ref >60)
Glucose, Bld: 149 mg/dL — ABNORMAL HIGH (ref 70–99)
Potassium: 4.3 mmol/L (ref 3.5–5.1)
Sodium: 138 mmol/L (ref 135–145)

## 2024-09-08 LAB — GLUCOSE, CAPILLARY: Glucose-Capillary: 154 mg/dL — ABNORMAL HIGH (ref 70–99)

## 2024-09-08 SURGERY — LEFT HEART CATH AND CORONARY ANGIOGRAPHY
Anesthesia: Moderate Sedation

## 2024-09-08 MED ORDER — SODIUM CHLORIDE 0.9% FLUSH
3.0000 mL | Freq: Two times a day (BID) | INTRAVENOUS | Status: DC
  Administered 2024-09-08: 3 mL via INTRAVENOUS

## 2024-09-08 MED ORDER — HYDRALAZINE HCL 20 MG/ML IJ SOLN: 10.0000 mg | INTRAMUSCULAR | Status: DC | PRN

## 2024-09-08 MED ORDER — FREE WATER
500.0000 mL | Freq: Once | Status: AC
  Administered 2024-09-08: 500 mL via ORAL

## 2024-09-08 MED ORDER — SODIUM CHLORIDE 0.9 % IV SOLN: 250.0000 mL | INTRAVENOUS | Status: DC | PRN

## 2024-09-08 MED ORDER — FENTANYL CITRATE (PF) 100 MCG/2ML IJ SOLN
INTRAMUSCULAR | Status: AC
  Filled 2024-09-08: qty 2

## 2024-09-08 MED ORDER — ONDANSETRON HCL 4 MG/2ML IJ SOLN: 4.0000 mg | Freq: Four times a day (QID) | INTRAMUSCULAR | Status: DC | PRN

## 2024-09-08 MED ORDER — MIDAZOLAM HCL (PF) 2 MG/2ML IJ SOLN
INTRAMUSCULAR | Status: DC | PRN
  Administered 2024-09-08: 1 mg via INTRAVENOUS

## 2024-09-08 MED ORDER — HEPARIN (PORCINE) IN NACL 1000-0.9 UT/500ML-% IV SOLN
INTRAVENOUS | Status: DC | PRN
  Administered 2024-09-08: 1000 mL

## 2024-09-08 MED ORDER — FENTANYL CITRATE (PF) 100 MCG/2ML IJ SOLN
INTRAMUSCULAR | Status: DC | PRN
  Administered 2024-09-08: 25 ug via INTRAVENOUS

## 2024-09-08 MED ORDER — VERAPAMIL HCL 2.5 MG/ML IV SOLN
INTRAVENOUS | Status: DC | PRN
  Administered 2024-09-08: 2.5 mg via INTRA_ARTERIAL

## 2024-09-08 MED ORDER — HEPARIN SODIUM (PORCINE) 1000 UNIT/ML IJ SOLN
INTRAMUSCULAR | Status: DC | PRN
  Administered 2024-09-08: 4000 [IU] via INTRAVENOUS

## 2024-09-08 MED ORDER — IOHEXOL 300 MG/ML  SOLN
INTRAMUSCULAR | Status: DC | PRN
  Administered 2024-09-08: 85 mL

## 2024-09-08 MED ORDER — SODIUM CHLORIDE 0.9% FLUSH: 3.0000 mL | INTRAVENOUS | Status: DC | PRN

## 2024-09-08 MED ORDER — VERAPAMIL HCL 2.5 MG/ML IV SOLN
INTRAVENOUS | Status: AC
  Filled 2024-09-08: qty 2

## 2024-09-08 MED ORDER — LIDOCAINE HCL 1 % IJ SOLN
INTRAMUSCULAR | Status: AC
  Filled 2024-09-08: qty 20

## 2024-09-08 MED ORDER — MIDAZOLAM HCL 2 MG/2ML IJ SOLN
INTRAMUSCULAR | Status: AC
  Filled 2024-09-08: qty 2

## 2024-09-08 MED ORDER — HEPARIN (PORCINE) IN NACL 1000-0.9 UT/500ML-% IV SOLN
INTRAVENOUS | Status: AC
Start: 2024-09-08 — End: 2024-09-08
  Filled 2024-09-08: qty 1000

## 2024-09-08 MED ORDER — LIDOCAINE HCL (PF) 1 % IJ SOLN
INTRAMUSCULAR | Status: DC | PRN
  Administered 2024-09-08: 2 mL

## 2024-09-08 MED ORDER — HEPARIN SODIUM (PORCINE) 1000 UNIT/ML IJ SOLN
INTRAMUSCULAR | Status: AC
  Filled 2024-09-08: qty 10

## 2024-09-08 MED ORDER — ASPIRIN 81 MG PO CHEW
81.0000 mg | CHEWABLE_TABLET | ORAL | Status: AC
  Administered 2024-09-08: 81 mg via ORAL

## 2024-09-08 MED ORDER — ACETAMINOPHEN 325 MG PO TABS: 650.0000 mg | ORAL_TABLET | ORAL | Status: DC | PRN

## 2024-09-08 MED ORDER — ASPIRIN 81 MG PO CHEW
CHEWABLE_TABLET | ORAL | Status: AC
  Filled 2024-09-08: qty 1

## 2024-09-08 SURGICAL SUPPLY — 9 items
CATH INFINITI 5 FR JL3.5 (CATHETERS) IMPLANT
CATH INFINITI JR4 5F (CATHETERS) IMPLANT
DEVICE RAD TR BAND REGULAR (VASCULAR PRODUCTS) IMPLANT
DRAPE BRACHIAL (DRAPES) IMPLANT
GLIDESHEATH SLEND SS 6F .021 (SHEATH) IMPLANT
GUIDEWIRE INQWIRE 1.5J.035X260 (WIRE) IMPLANT
PACK CARDIAC CATH (CUSTOM PROCEDURE TRAY) ×1 IMPLANT
SET ATX-X65L (MISCELLANEOUS) IMPLANT
STATION PROTECTION PRESSURIZED (MISCELLANEOUS) IMPLANT

## 2024-09-13 LAB — CARDIAC CATHETERIZATION: Cath EF Quantitative: 60 %

## 2024-09-14 ENCOUNTER — Encounter: Payer: Self-pay | Admitting: Internal Medicine

## 2024-09-22 ENCOUNTER — Ambulatory Visit: Payer: Medicare Other | Admitting: Nurse Practitioner

## 2024-09-27 DIAGNOSIS — I200 Unstable angina: Secondary | ICD-10-CM | POA: Insufficient documentation

## 2024-09-27 DIAGNOSIS — I5032 Chronic diastolic (congestive) heart failure: Secondary | ICD-10-CM | POA: Insufficient documentation

## 2024-10-05 HISTORY — PX: CORONARY ARTERY BYPASS GRAFT: SHX141

## 2024-10-11 ENCOUNTER — Other Ambulatory Visit: Payer: Self-pay

## 2024-10-11 MED ORDER — ACCU-CHEK GUIDE ME W/DEVICE KIT: PACK | 0 refills | Status: AC

## 2024-10-11 MED ORDER — ACCU-CHEK SOFTCLIX LANCETS MISC: 3 refills | Status: AC

## 2024-10-11 MED ORDER — ACCU-CHEK GUIDE TEST VI STRP: ORAL_STRIP | 3 refills | Status: AC

## 2024-10-11 NOTE — Telephone Encounter (Signed)
 Pt wife called that he discharge from hospital and advised him to check glucose 4 to 6 times daily and not pres them meter or test strips as per alyssa sent

## 2024-10-15 ENCOUNTER — Telehealth: Payer: Self-pay | Admitting: Nurse Practitioner

## 2024-10-15 NOTE — Telephone Encounter (Signed)
 Received diabetic supply order from [facility]. Gave to Alyssa-Toni

## 2024-10-19 ENCOUNTER — Ambulatory Visit: Admitting: Internal Medicine

## 2024-10-19 ENCOUNTER — Encounter: Payer: Self-pay | Admitting: Internal Medicine

## 2024-10-19 VITALS — BP 130/80 | HR 81 | Temp 97.8°F | Resp 16 | Ht 70.5 in | Wt 202.0 lb

## 2024-10-19 DIAGNOSIS — E1169 Type 2 diabetes mellitus with other specified complication: Secondary | ICD-10-CM | POA: Diagnosis not present

## 2024-10-19 DIAGNOSIS — E1165 Type 2 diabetes mellitus with hyperglycemia: Secondary | ICD-10-CM

## 2024-10-19 DIAGNOSIS — E1159 Type 2 diabetes mellitus with other circulatory complications: Secondary | ICD-10-CM

## 2024-10-19 DIAGNOSIS — I25810 Atherosclerosis of coronary artery bypass graft(s) without angina pectoris: Secondary | ICD-10-CM | POA: Diagnosis not present

## 2024-10-19 DIAGNOSIS — E785 Hyperlipidemia, unspecified: Secondary | ICD-10-CM

## 2024-10-19 DIAGNOSIS — I152 Hypertension secondary to endocrine disorders: Secondary | ICD-10-CM

## 2024-10-19 MED ORDER — EMPAGLIFLOZIN 10 MG PO TABS: 10.0000 mg | ORAL_TABLET | Freq: Every day | ORAL | 3 refills | Status: DC

## 2024-10-19 NOTE — Progress Notes (Signed)
 [facility] [address] [place], [place] [address]  Internal MEDICINE  Office Visit Note  Patient Name: Mark Wyatt  [id number]  [id number]  Date of Service: 11/04/2024  [facility] visit    Chief Complaint  Patient presents with   Hospitalization Follow-up     HPI Pt is here for recent hospital follow up. Pt was discharged on 12/21 after CABG, during his visit he was noted to have elevated glucose he was started on glipizide however had hypoglycemic events when he was stopped Status post CABG patient is supposed to see his cardiothoracic surgeon for follow-up and staple removal He is also looking forward to get cardiopulmonary rehab Takes all medication as prescribed Does have minor lower extremity edema  Current Medication: Outpatient Encounter Medications as of 10/19/2024  Medication Sig   Accu-Chek Softclix Lancets lancets Use as instructed 4 times daily DX e11.65   [EXPIRED] acetaminophen  (TYLENOL ) 325 MG tablet Take 975 mg by mouth.   amiodarone (PACERONE) 200 MG tablet Take 200 mg by mouth.   amLODipine  (NORVASC ) 2.5 MG tablet Take 1 tablet (2.5 mg total) by mouth daily.   aspirin  EC 81 MG tablet Take 81 mg by mouth at bedtime.   atorvastatin  (LIPITOR) 40 MG tablet TAKE 1 TABLET(40 MG) BY MOUTH DAILY AT 6 PM   Blood Glucose Monitoring Suppl (ACCU-CHEK GUIDE ME) w/Device KIT Use as directed  DxE11.65   empagliflozin  (JARDIANCE ) 10 MG TABS tablet Take 1 tablet (10 mg total) by mouth daily.   furosemide (LASIX) 20 MG tablet Take 20 mg by mouth 2 (two) times daily.   glipiZIDE 2.5 MG TABS Take 2.5 mg by mouth.   glucose blood (ACCU-CHEK GUIDE TEST) test strip Use as instructed glucose 4 times daily   losartan  (COZAAR ) 100 MG tablet Take 100 mg by mouth daily.   metoprolol  succinate (TOPROL -XL) 50 MG 24 hr tablet Take 1 tablet (50 mg total) by mouth at bedtime.   metoprolol  tartrate (LOPRESSOR ) 50 MG tablet Take 50 mg by mouth.   oxyCODONE  (OXY IR/ROXICODONE ) 5 MG  immediate release tablet Take 5 mg by mouth 4 (four) times daily as needed.   [EXPIRED] pantoprazole  (PROTONIX ) 40 MG tablet Take 40 mg by mouth.   polyethylene glycol (MIRALAX  / GLYCOLAX ) 17 g packet Take 17 g by mouth.   potassium chloride  SA (KLOR-CON  M) 20 MEQ tablet Take 20 mEq by mouth.   senna-docusate (SENOKOT-S) 8.6-50 MG tablet Take 1 tablet by mouth 2 (two) times daily.   isosorbide mononitrate (IMDUR) 30 MG 24 hr tablet Take 30 mg by mouth daily. (Patient not taking: Reported on 10/19/2024)   sildenafil  (VIAGRA ) 100 MG tablet Take 1 tablet (100 mg total) by mouth daily as needed for erectile dysfunction (take 1 hour prior to sexual activity). (Patient not taking: Reported on 10/19/2024)   tadalafil  (CIALIS ) 5 MG tablet Take 1-4 tablets (5-20 mg total) by mouth daily as needed for erectile dysfunction (take 1 hour prior to sexual activity). (Patient not taking: Reported on 10/19/2024)   No facility-administered encounter medications on file as of 10/19/2024.    Surgical History: Past Surgical History:  Procedure Laterality Date   ANAL FISTULOTOMY     APPENDECTOMY     as a child   CATARACT EXTRACTION Right 02/2022   CATARACT EXTRACTION Left 03/2022   COLONOSCOPY WITH PROPOFOL  N/A 02/13/2021   Procedure: COLONOSCOPY WITH PROPOFOL ;  Surgeon: Unk Corinn Skiff, MD;  Location: [facility] ENDOSCOPY;  Service: Gastroenterology;  Laterality: N/A;   CORONARY  STENT INTERVENTION N/A 02/24/2017   Procedure: Coronary Stent Intervention;  Surgeon: Florencio Cara BIRCH, MD;  Location: [facility] INVASIVE CV LAB;  Service: Cardiovascular;  Laterality: N/A;   ESOPHAGOGASTRODUODENOSCOPY N/A 02/10/2021   Procedure: ESOPHAGOGASTRODUODENOSCOPY (EGD);  Surgeon: Therisa Bi, MD;  Location[facility] ENDOSCOPY;  Service: Gastroenterology;  Laterality: N/A;   ESOPHAGOGASTRODUODENOSCOPY (EGD) WITH PROPOFOL  N/A 01/25/2021   Procedure: ESOPHAGOGASTRODUODENOSCOPY (EGD) WITH PROPOFOL ;  Surgeon: Janalyn Keene NOVAK, MD;   Location: [facility] ENDOSCOPY;  Service: Endoscopy;  Laterality: N/A;   GIVENS CAPSULE STUDY N/A 02/21/2021   Procedure: GIVENS CAPSULE STUDY;  Surgeon: Janalyn Keene NOVAK, MD;  Location: [facility] ENDOSCOPY;  Service: Endoscopy;  Laterality: N/A;   HOLEP-LASER ENUCLEATION OF THE PROSTATE WITH MORCELLATION N/A 10/31/2023   Procedure: HOLEP-LASER ENUCLEATION OF THE PROSTATE WITH MORCELLATION;  Surgeon: Francisca Redell BROCKS, MD;  Location: [facility] ORS;  Service: Urology;  Laterality: N/A;   LEFT HEART CATH AND CORONARY ANGIOGRAPHY N/A 02/24/2017   Procedure: Left Heart Cath and Coronary Angiography;  Surgeon: Bosie Vinie LABOR, MD;  Location: [facility] INVASIVE CV LAB;  Service: Cardiovascular;  Laterality: N/A;   LEFT HEART CATH AND CORONARY ANGIOGRAPHY N/A 09/08/2024   Procedure: LEFT HEART CATH AND CORONARY ANGIOGRAPHY;  Surgeon: Florencio Cara BIRCH, MD;  Location: [facility] INVASIVE CV LAB;  Service: Cardiovascular;  Laterality: N/A;   TONSILLECTOMY     TRIGGER FINGER RELEASE Right 10/2021    Medical History: Past Medical History:  Diagnosis Date   Anal fistula    a.) s/p anal fistulotomy   Anemia    Anginal pain    Aortic atherosclerosis    AVM (arteriovenous malformation) of duodenum    Bilateral carotid artery disease 09/08/2020   a.) carotid doppler 09/08/2020: 50-70% RICA, < 50% LICA   Bilateral renal cysts    Coronary artery disease 02/24/2017   a.) LHC/PCI 02/24/2017: 95% dRCA-1 (2.5 x 38 mm Xience Alpine DES), 60% dRCA-2, 75% RPDA (2.5 x 38 mm Xience Alpine DES), 60% OM1, 70% p-mLCx, 70% D1, 65% pLAD   DDD (degenerative disc disease), cervical    DDD (degenerative disc disease), thoracolumbar    Duodenitis    Erectile dysfunction    a.) on PDE5i (tadalafil )   Esophagitis    Gastric polyp    GERD (gastroesophageal reflux disease)    Gout    History of bilateral cataract extraction 2023   Hyperlipidemia    Hypertension    NSTEMI (non-ST elevated myocardial infarction) (HCC) 02/21/2017   a,)  troponins were trended: <0.03 --> 2.98 --> 4.93 ng/mL; b.) LHC/PCI 02/24/2017 --> 2.5 x 38 mm Xience Alpine DES x 2 to dRCA-1 and RPDA (non-overlapping)   Osteoarthritis    Overactive bladder    Perirectal abscess    RBBB (right bundle branch block)    Trigger finger of all digits of right hand    Type 2 diabetes, diet controlled (HCC)    Ventricular bigeminy     Family History: Family History  Problem Relation Age of Onset   Diabetes Mother     Social History   Socioeconomic History   Marital status: Married    Spouse name: Sherrell   Number of children: Not on file   Years of education: Not on file   Highest education level: Not on file  Occupational History   Not on file  Tobacco Use   Smoking status: Former    Passive exposure: Past   Smokeless tobacco: Never  Vaping Use   Vaping status: Never Used  Substance and Sexual  Activity   Alcohol use: Yes    Alcohol/week: 6.0 standard drinks of alcohol    Types: 6 Cans of beer per week    Comment: 1 or 2 with dinner   Drug use: No   Sexual activity: Not on file  Other Topics Concern   Not on file  Social History Narrative   Not on file   Social Drivers of Health   Tobacco Use: Medium Risk (10/25/2024)   Received from [facility]   Patient History    Smoking Tobacco Use: Former    Smokeless Tobacco Use: Never    Passive Exposure: Not on file  Financial Resource Strain: Not on file  Food Insecurity: Not on file  Transportation Needs: No Transportation Needs (10/05/2024)   Received from [facility]   PRAPARE - Transportation    In the past 12 months, has lack of transportation kept you from medical appointments or from getting medications?: No    Lack of Transportation (Non-Medical): No  Physical Activity: Not on file  Stress: Not on file  Social Connections: Not on file  Intimate Partner Violence: Not on file  Depression (PHQ2-9): Low Risk (10/19/2024)   Depression (PHQ2-9)     PHQ-2 Score: 0  Alcohol Screen: Low Risk (05/01/2022)   Alcohol Screen    Last Alcohol Screening Score (AUDIT): 4  Housing: Unknown (10/22/2024)   Received from [facility]   Epic    Unable to Pay for Housing in the Last Year: Not on file    Number of Times Moved in the Last Year: Not on file    At any time in the past 12 months, were you homeless or living in a shelter (including now)?: No  Utilities: Not on file  Health Literacy: Not on file      Review of Systems  Constitutional:  Negative for fatigue and fever.  HENT:  Negative for congestion, mouth sores and postnasal drip.   Respiratory:  Positive for chest tightness. Negative for cough.   Cardiovascular:  Negative for chest pain.  Genitourinary:  Negative for flank pain.  Psychiatric/Behavioral: Negative.      Vital Signs: BP 130/80   Pulse 81   Temp 97.8 F (36.6 C)   Resp 16   Ht 5' 10.5 (1.791 m)   Wt 202 lb (91.6 kg)   SpO2 95%   BMI 28.57 kg/m    Physical Exam Constitutional:      Appearance: Normal appearance.  HENT:     Head: Normocephalic and atraumatic.     Nose: Nose normal.     Mouth/Throat:     Mouth: Mucous membranes are moist.     Pharynx: No posterior oropharyngeal erythema.  Eyes:     Extraocular Movements: Extraocular movements intact.     Pupils: Pupils are equal, round, and reactive to light.  Cardiovascular:     Pulses: Normal pulses.     Heart sounds: Normal heart sounds.  Pulmonary:     Effort: Pulmonary effort is normal.     Breath sounds: Normal breath sounds.  Chest:       Comments: All staples are intact, no induration noted Neurological:     General: No focal deficit present.     Mental Status: He is alert.  Psychiatric:        Mood and Affect: Mood normal.        Behavior: Behavior normal.       Assessment/Plan: 1. Type 2 diabetes  mellitus with hyperglycemia, without long-term current use of insulin  (HCC) (Primary) Samples of Jardiance  10 mg  given prescription is also sent to see if it is covered through his insurance company  2. Coronary atherosclerosis of autologous artery bypass graft without angina Patient will have a follow-up with his cardiology and cardiothoracic surgeon in further management  3. Hypertension associated with diabetes (HCC) Well-controlled we will continue to monitor  4. Hyperlipidemia associated with type 2 diabetes mellitus (HCC) Patient is on a statin  General Counseling: Carston verbalizes understanding of the findings of todays visit and agrees with plan of treatment. I have discussed any further diagnostic evaluation that may be needed or ordered today. We also reviewed his medications today. he has been encouraged to call the office with any questions or concerns that should arise related to todays visit.    Counseling:  [place] Controlled Substance Database was reviewed by me. Meds ordered this encounter  Medications   empagliflozin  (JARDIANCE ) 10 MG TABS tablet    Sig: Take 1 tablet (10 mg total) by mouth daily.    Dispense:  90 tablet    Refill:  3        I have reviewed all medical records from hospital follow up including radiology reports and consults from other physicians. Appropriate follow up diagnostics will be scheduled as needed. Patient/ Family understands the plan of treatment. Time spent45 minutes.   Dr Sigrid CHRISTELLA Bathe, MD Internal Medicine

## 2024-10-25 ENCOUNTER — Telehealth: Payer: Self-pay | Admitting: Nurse Practitioner

## 2024-10-25 ENCOUNTER — Ambulatory Visit: Admitting: Nurse Practitioner

## 2024-10-25 NOTE — Telephone Encounter (Signed)
 Diabetic supply order signed and faxed back to [facility]; [phone]. Scanned-Toni

## 2024-11-02 ENCOUNTER — Telehealth: Payer: Self-pay

## 2024-11-02 NOTE — Telephone Encounter (Signed)
 Faxed [facility] for diabetes testing supplies

## 2024-11-08 ENCOUNTER — Other Ambulatory Visit: Payer: Self-pay

## 2024-11-08 ENCOUNTER — Encounter: Attending: Cardiology

## 2024-11-08 VITALS — Ht 71.5 in | Wt 193.7 lb

## 2024-11-08 DIAGNOSIS — M109 Gout, unspecified: Secondary | ICD-10-CM | POA: Insufficient documentation

## 2024-11-08 DIAGNOSIS — Z951 Presence of aortocoronary bypass graft: Secondary | ICD-10-CM

## 2024-11-08 NOTE — Progress Notes (Signed)
 Completed program orientation and . Initial ITP created and sent for review to Medical Director.

## 2024-11-08 NOTE — Progress Notes (Signed)
 Cardiac Individual Treatment Plan  Patient Details  Name: Mark Wyatt MRN: [id number] Date of Birth: 13-Aug-1951 Referring Provider:   Flowsheet Row Cardiac Rehab from 11/08/2024 in [facility] Cardiac and Pulmonary Rehab  Referring Provider Dr. Keller Paterson, MD    Initial Encounter Date:  Flowsheet Row Cardiac Rehab from 11/08/2024 in [facility] Cardiac and Pulmonary Rehab  Date 11/08/24    Visit Diagnosis: S/P CABG x 3  Patient's Home Medications on Admission: Current Medications[1]  Past Medical History: Past Medical History:  Diagnosis Date   Anal fistula    a.) s/p anal fistulotomy   Anemia    Anginal pain    Aortic atherosclerosis    AVM (arteriovenous malformation) of duodenum    Bilateral carotid artery disease 09/08/2020   a.) carotid doppler 09/08/2020: 50-70% RICA, < 50% LICA   Bilateral renal cysts    Coronary artery disease 02/24/2017   a.) LHC/PCI 02/24/2017: 95% dRCA-1 (2.5 x 38 mm Xience Alpine DES), 60% dRCA-2, 75% RPDA (2.5 x 38 mm Xience Alpine DES), 60% OM1, 70% p-mLCx, 70% D1, 65% pLAD   DDD (degenerative disc disease), cervical    DDD (degenerative disc disease), thoracolumbar    Duodenitis    Erectile dysfunction    a.) on PDE5i (tadalafil )   Esophagitis    Gastric polyp    GERD (gastroesophageal reflux disease)    Gout    History of bilateral cataract extraction 2023   Hyperlipidemia    Hypertension    NSTEMI (non-ST elevated myocardial infarction) (HCC) 02/21/2017   a,) troponins were trended: <0.03 --> 2.98 --> 4.93 ng/mL; b.) LHC/PCI 02/24/2017 --> 2.5 x 38 mm Xience Alpine DES x 2 to dRCA-1 and RPDA (non-overlapping)   Osteoarthritis    Overactive bladder    Perirectal abscess    RBBB (right bundle branch block)    Trigger finger of all digits of right hand    Type 2 diabetes, diet controlled (HCC)    Ventricular bigeminy     Tobacco Use: Tobacco Use History[2]  Labs: Review Flowsheet  More data exists      Latest Ref Rng & Units 03/19/2023  07/23/2023 09/19/2023 01/12/2024 05/17/2024  Labs for ITP Cardiac and Pulmonary Rehab  Cholestrol 100 - 199 mg/dL - - 898  - -  LDL (calc) 0 - 99 mg/dL - - 50  - -  HDL-C >60 mg/dL - - 35  - -  Trlycerides 0 - 149 mg/dL - - 77  - -  Hemoglobin A1c 4.0 - 5.6 % 6.7  6.6  6.9  7.5  6.9      Exercise Target Goals: Exercise Program Goal: Individual exercise prescription set using results from initial 6 min walk test and THRR while considering  patients activity barriers and safety.   Exercise Prescription Goal: Initial exercise prescription builds to 30-45 minutes a day of aerobic activity, 2-3 days per week.  Home exercise guidelines will be given to patient during program as part of exercise prescription that the participant will acknowledge.   Education: Aerobic Exercise: - Group verbal and visual presentation on the components of exercise prescription. Introduces F.I.T.T principle from ACSM for exercise prescriptions.  Reviews F.I.T.T. principles of aerobic exercise including progression. Written material provided at class time.   Education: Resistance Exercise: - Group verbal and visual presentation on the components of exercise prescription. Introduces F.I.T.T principle from ACSM for exercise prescriptions  Reviews F.I.T.T. principles of resistance exercise including progression. Written material provided at class time.    Education:  Exercise & Equipment Safety: - Individual verbal instruction and demonstration of equipment use and safety with use of the equipment. Flowsheet Row Cardiac Rehab from 11/08/2024 in [facility] Cardiac and Pulmonary Rehab  Date 11/08/24  Educator NT  Instruction Review Code 1- Verbalizes Understanding    Education: Exercise Physiology & General Exercise Guidelines: - Group verbal and written instruction with models to review the exercise physiology of the cardiovascular system and associated critical values. Provides general exercise guidelines with specific  guidelines to those with heart or lung disease. Written material provided at class time.   Education: Flexibility, Balance, Mind/Body Relaxation: - Group verbal and visual presentation with interactive activity on the components of exercise prescription. Introduces F.I.T.T principle from ACSM for exercise prescriptions. Reviews F.I.T.T. principles of flexibility and balance exercise training including progression. Also discusses the mind body connection.  Reviews various relaxation techniques to help reduce and manage stress (i.e. Deep breathing, progressive muscle relaxation, and visualization). Balance handout provided to take home. Written material provided at class time.   Activity Barriers & Risk Stratification:  Activity Barriers & Cardiac Risk Stratification - 11/08/24 0954       Activity Barriers & Cardiac Risk Stratification   Activity Barriers None;Back Problems;Other (comment)    Comments Hx knee surgery    Cardiac Risk Stratification High          6 Minute Walk:  6 Minute Walk     Row Name 11/08/24 1109         6 Minute Walk   Phase Initial     Distance 1325 feet     Walk Time 6 minutes     # of Rest Breaks 0     MPH 2.51     METS 2.68     RPE 7     Perceived Dyspnea  0     VO2 Peak 9.4     Symptoms No     Resting HR 56 bpm     Resting BP 112/58     Resting Oxygen Saturation  97 %     Exercise Oxygen Saturation  during 6 min walk 95 %     Max Ex. HR 81 bpm     Max Ex. BP 134/62     2 Minute Post BP 122/60        Oxygen Initial Assessment:   Oxygen Re-Evaluation:   Oxygen Discharge (Final Oxygen Re-Evaluation):   Initial Exercise Prescription:  Initial Exercise Prescription - 11/08/24 1100       Date of Initial Exercise RX and Referring Provider   Date 11/08/24    Referring Provider Dr. Keller Paterson, MD      Oxygen   Oxygen Continuous      Treadmill   MPH 2.5    Grade 0    Minutes 15    METs 2.91      Recumbant Bike   Level 2     RPM 50    Watts 19    Minutes 15    METs 2.68      REL-XR   Level 2    Speed 50    Minutes 15    METs 2.68      T5 Nustep   Level 1    SPM 80    Minutes 15    METs 2.68      Prescription Details   Frequency (times per week) 2    Duration Progress to 30 minutes of continuous aerobic without signs/symptoms of physical distress  Intensity   THRR 40-80% of Max Heartrate 92-128    Ratings of Perceived Exertion 11-13    Perceived Dyspnea 0-4      Progression   Progression Continue to progress workloads to maintain intensity without signs/symptoms of physical distress.      Resistance Training   Training Prescription Yes    Weight 3 lb    Reps 10-15          Perform Capillary Blood Glucose checks as needed.  Exercise Prescription Changes:   Exercise Prescription Changes     Row Name 11/08/24 1100             Response to Exercise   Blood Pressure (Admit) 112/58       Blood Pressure (Exercise) 134/62       Blood Pressure (Exit) 122/60       Heart Rate (Admit) 56 bpm       Heart Rate (Exercise) 81 bpm       Heart Rate (Exit) 63 bpm       Oxygen Saturation (Admit) 97 %       Oxygen Saturation (Exercise) 95 %       Rating of Perceived Exertion (Exercise) 7       Perceived Dyspnea (Exercise) 0       Symptoms none       Comments Results          Exercise Comments:   Exercise Goals and Review:   Exercise Goals     Row Name 11/08/24 1119             Exercise Goals   Increase Physical Activity Yes       Intervention Provide advice, education, support and counseling about physical activity/exercise needs.;Develop an individualized exercise prescription for aerobic and resistive training based on initial evaluation findings, risk stratification, comorbidities and participant's personal goals.       Expected Outcomes Short Term: Attend rehab on a regular basis to increase amount of physical activity.;Long Term: Add in home exercise to make  exercise part of routine and to increase amount of physical activity.;Long Term: Exercising regularly at least 3-5 days a week.       Increase Strength and Stamina Yes       Intervention Provide advice, education, support and counseling about physical activity/exercise needs.;Develop an individualized exercise prescription for aerobic and resistive training based on initial evaluation findings, risk stratification, comorbidities and participant's personal goals.       Expected Outcomes Short Term: Increase workloads from initial exercise prescription for resistance, speed, and METs.;Short Term: Perform resistance training exercises routinely during rehab and add in resistance training at home;Long Term: Improve cardiorespiratory fitness, muscular endurance and strength as measured by increased METs and functional capacity ( )       Able to understand and use rate of perceived exertion (RPE) scale Yes       Intervention Provide education and explanation on how to use RPE scale       Expected Outcomes Short Term: Able to use RPE daily in rehab to express subjective intensity level;Long Term:  Able to use RPE to guide intensity level when exercising independently       Able to understand and use Dyspnea scale Yes       Intervention Provide education and explanation on how to use Dyspnea scale       Expected Outcomes Short Term: Able to use Dyspnea scale daily in rehab to express subjective sense of shortness of  breath during exertion;Long Term: Able to use Dyspnea scale to guide intensity level when exercising independently       Knowledge and understanding of Target Heart Rate Range (THRR) Yes       Intervention Provide education and explanation of THRR including how the numbers were predicted and where they are located for reference       Expected Outcomes Short Term: Able to state/look up THRR;Long Term: Able to use THRR to govern intensity when exercising independently;Short Term: Able to use daily  as guideline for intensity in rehab       Able to check pulse independently Yes       Intervention Provide education and demonstration on how to check pulse in carotid and radial arteries.;Review the importance of being able to check your own pulse for safety during independent exercise       Expected Outcomes Short Term: Able to explain why pulse checking is important during independent exercise;Long Term: Able to check pulse independently and accurately       Understanding of Exercise Prescription Yes       Intervention Provide education, explanation, and written materials on patient's individual exercise prescription       Expected Outcomes Short Term: Able to explain program exercise prescription;Long Term: Able to explain home exercise prescription to exercise independently          Exercise Goals Re-Evaluation :   Discharge Exercise Prescription (Final Exercise Prescription Changes):  Exercise Prescription Changes - 11/08/24 1100       Response to Exercise   Blood Pressure (Admit) 112/58    Blood Pressure (Exercise) 134/62    Blood Pressure (Exit) 122/60    Heart Rate (Admit) 56 bpm    Heart Rate (Exercise) 81 bpm    Heart Rate (Exit) 63 bpm    Oxygen Saturation (Admit) 97 %    Oxygen Saturation (Exercise) 95 %    Rating of Perceived Exertion (Exercise) 7    Perceived Dyspnea (Exercise) 0    Symptoms none    Comments Results          Nutrition:  Target Goals: Understanding of nutrition guidelines, daily intake of sodium 1500mg , cholesterol 200mg , calories 30% from fat and 7% or less from saturated fats, daily to have 5 or more servings of fruits and vegetables.  Education: Nutrition 1 -Group instruction provided by verbal, written material, interactive activities, discussions, models, and posters to present general guidelines for heart healthy nutrition including macronutrients, label reading, and promoting whole foods over processed counterparts. Education serves  as pensions consultant of discussion of heart healthy eating for all. Written material provided at class time.    Education: Nutrition 2 -Group instruction provided by verbal, written material, interactive activities, discussions, models, and posters to present general guidelines for heart healthy nutrition including sodium, cholesterol, and saturated fat. Providing guidance of habit forming to improve blood pressure, cholesterol, and body weight. Written material provided at class time.     Biometrics:  Pre Biometrics - 11/08/24 1120       Pre Biometrics   Height 5' 11.5 (1.816 m)    Weight 193 lb 11.2 oz (87.9 kg)    Waist Circumference 39 inches    Hip Circumference 40 inches    Waist to Hip Ratio 0.98 %    BMI (Calculated) 26.64    Single Leg Stand 24.8 seconds           Nutrition Therapy Plan and Nutrition Goals:  Nutrition Therapy & Goals -  11/08/24 1057       Nutrition Therapy   Diet Carb controlled, Cardiac, Low Na    Protein (specify units) 60-80    Fiber 30 grams    Whole Grain Foods 3 servings    Saturated Fats 15 max. grams    Fruits and Vegetables 5 servings/day    Sodium 2 grams      Personal Nutrition Goals   Nutrition Goal Eat 3 times per day, small frequent meals or nutrient dense snacks    Personal Goal #2 Reduce sodium intake to below 2300mg . Ideally 1500mg  per day.    Personal Goal #3 Continue to drink protein shake daily    Comments Mark Wyatt drinks mostly water . He has coffee and an occasional pepsi. He eats 2-3 times per day. He drinks a protein shake daily to help him meet nutritional goals. Encouraged him to continue to drink it and eat 3 times per day. Discussed limiting sodium, he says he doesn't salt his food anymore. Will continue to monitor and support as he attends rehab, with goal of slowly introducing topics without overwhelming him with new information.      Intervention Plan   Intervention Prescribe, educate and counsel regarding individualized  specific dietary modifications aiming towards targeted core components such as weight, hypertension, lipid management, diabetes, heart failure and other comorbidities.;Nutrition handout(s) given to patient.    Expected Outcomes Short Term Goal: Understand basic principles of dietary content, such as calories, fat, sodium, cholesterol and nutrients.;Short Term Goal: A plan has been developed with personal nutrition goals set during dietitian appointment.;Long Term Goal: Adherence to prescribed nutrition plan.          Nutrition Assessments:  MEDIFICTS Score Key: >=70 Need to make dietary changes  40-70 Heart Healthy Diet <= 40 Therapeutic Level Cholesterol Diet   Picture Your Plate Scores: <59 Unhealthy dietary pattern with much room for improvement. 41-50 Dietary pattern unlikely to meet recommendations for good health and room for improvement. 51-60 More healthful dietary pattern, with some room for improvement.  >60 Healthy dietary pattern, although there may be some specific behaviors that could be improved.    Nutrition Goals Re-Evaluation:   Nutrition Goals Discharge (Final Nutrition Goals Re-Evaluation):   Psychosocial: Target Goals: Acknowledge presence or absence of significant depression and/or stress, maximize coping skills, provide positive support system. Participant is able to verbalize types and ability to use techniques and skills needed for reducing stress and depression.   Education: Stress, Anxiety, and Depression - Group verbal and visual presentation to define topics covered.  Reviews how body is impacted by stress, anxiety, and depression.  Also discusses healthy ways to reduce stress and to treat/manage anxiety and depression. Written material provided at class time.   Education: Sleep Hygiene -Provides group verbal and written instruction about how sleep can affect your health.  Define sleep hygiene, discuss sleep cycles and impact of sleep habits. Review  good sleep hygiene tips.   Initial Review & Psychosocial Screening:  Initial Psych Review & Screening - 11/08/24 0959       Initial Review   Current issues with None Identified      Family Dynamics   Good Support System? Yes   wife who is a retired Agricultural Consultant   Psychosocial barriers to participate in program There are no identifiable barriers or psychosocial needs.      Screening Interventions   Interventions Encouraged to exercise;Provide feedback about the scores to participant    Expected Outcomes  Short Term goal: Utilizing psychosocial counselor, staff and physician to assist with identification of specific Stressors or current issues interfering with healing process. Setting desired goal for each stressor or current issue identified.;Long Term Goal: Stressors or current issues are controlled or eliminated.;Short Term goal: Identification and review with participant of any Quality of Life or Depression concerns found by scoring the questionnaire.;Long Term goal: The participant improves quality of Life and PHQ9 Scores as seen by post scores and/or verbalization of changes          Quality of Life Scores:   Scores of 19 and below usually indicate a poorer quality of life in these areas.  A difference of  2-3 points is a clinically meaningful difference.  A difference of 2-3 points in the total score of the Quality of Life Index has been associated with significant improvement in overall quality of life, self-image, physical symptoms, and general health in studies assessing change in quality of life.  PHQ-9: Review Flowsheet  More data exists      11/08/2024 10/19/2024 05/17/2024 09/16/2023 12/16/2022  Depression screen PHQ 2/9  Decreased Interest 0 0 0 0 0  Down, Depressed, Hopeless 0 0 0 0 0  PHQ - 2 Score 0 0 0 0 0  Altered sleeping 0 - - - -  Tired, decreased energy 0 - - - -  Change in appetite 0 - - - -  Feeling bad or failure about yourself  0 - - - -  Trouble  concentrating 0 - - - -  Moving slowly or fidgety/restless 0 - - - -  Suicidal thoughts 0 - - - -  PHQ-9 Score 0 - - - -  Difficult doing work/chores Somewhat difficult - - - -   Interpretation of Total Score  Total Score Depression Severity:  1-4 = Minimal depression, 5-9 = Mild depression, 10-14 = Moderate depression, 15-19 = Moderately severe depression, 20-27 = Severe depression   Psychosocial Evaluation and Intervention:  Psychosocial Evaluation - 11/08/24 0959       Psychosocial Evaluation & Interventions   Interventions Stress management education;Relaxation education;Encouraged to exercise with the program and follow exercise prescription    Comments Lin is ready to start the program after undergoing a CABG x3 last month. He states that he has been walking at the mall with his wife and using some hand weights at home. He is hoping to increase his indurance during the program. He states that he uses a health tracker to keep up with his heart rate while exercising. Joren states that he has lost some weight since surgery, but is happy with his current weight. 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 He also enjoys hunting deer and elk and uses that meat to supplement a good part of his diet. He lives at home with his wife, a retired engineer, civil (consulting), who he states helps him keep up with his medication and appointments.    Expected Outcomes Short: Attend cardiac rehab for education and exercise. Long: Develop and maintain positive self care habits.    Continue Psychosocial Services  Follow up required by staff          Psychosocial Re-Evaluation:   Psychosocial Discharge (Final Psychosocial Re-Evaluation):   Vocational Rehabilitation: Provide vocational rehab assistance to qualifying candidates.   Vocational Rehab Evaluation & Intervention:  Vocational Rehab - 11/08/24 0959       Initial Vocational Rehab Evaluation & Intervention  Assessment shows need for Vocational Rehabilitation No   retired         Education: Education Goals: Education classes will be provided on a variety of topics geared toward better understanding of heart health and risk factor modification. Participant will state understanding/return demonstration of topics presented as noted by education test scores.  Learning Barriers/Preferences:  Learning Barriers/Preferences - 11/08/24 0958       Learning Barriers/Preferences   Learning Barriers None    Learning Preferences None          General Cardiac Education Topics:  AED/CPR: - Group verbal and written instruction with the use of models to demonstrate the basic use of the AED with the basic ABC's of resuscitation.   Test and Procedures: - Group verbal and visual presentation and models provide information about basic cardiac anatomy and function. Reviews the testing methods done to diagnose heart disease and the outcomes of the test results. Describes the treatment choices: Medical Management, Angioplasty, or Coronary Bypass Surgery for treating various heart conditions including Myocardial Infarction, Angina, Valve Disease, and Cardiac Arrhythmias. Written material provided at class time.   Medication Safety: - Group verbal and visual instruction to review commonly prescribed medications for heart and lung disease. Reviews the medication, class of the drug, and side effects. Includes the steps to properly store meds and maintain the prescription regimen. Written material provided at class time.   Intimacy: - Group verbal instruction through game format to discuss how heart and lung disease can affect sexual intimacy. Written material provided at class time.   Know Your Numbers and Heart Failure: - Group verbal and visual instruction to discuss disease risk factors for cardiac and pulmonary disease and treatment options.  Reviews associated critical values for Overweight/Obesity,  Hypertension, Cholesterol, and Diabetes.  Discusses basics of heart failure: signs/symptoms and treatments.  Introduces Heart Failure Zone chart for action plan for heart failure. Written material provided at class time.   Infection Prevention: - Provides verbal and written material to individual with discussion of infection control including proper hand washing and proper equipment cleaning during exercise session. Flowsheet Row Cardiac Rehab from 11/08/2024 in [facility] Cardiac and Pulmonary Rehab  Date 11/08/24  Educator [facility]  Instruction Review Code 1- Verbalizes Understanding    Falls Prevention: - Provides verbal and written material to individual with discussion of falls prevention and safety. Flowsheet Row Cardiac Rehab from 11/08/2024 in [facility] Cardiac and Pulmonary Rehab  Date 11/08/24  Educator [facility]  Instruction Review Code 1- Verbalizes Understanding    Other: -Provides group and verbal instruction on various topics (see comments)   Knowledge Questionnaire Score:   Core Components/Risk Factors/Patient Goals at Admission:  Personal Goals and Risk Factors at Admission - 11/08/24 0957       Core Components/Risk Factors/Patient Goals on Admission    Weight Management Yes;Weight Maintenance    Intervention Weight Management: Develop a combined nutrition and exercise program designed to reach desired caloric intake, while maintaining appropriate intake of nutrient and fiber, sodium and fats, and appropriate energy expenditure required for the weight goal.;Weight Management: Provide education and appropriate resources to help participant work on and attain dietary goals.;Weight Management/Obesity: Establish reasonable short term and long term weight goals.    Expected Outcomes Short Term: Continue to assess and modify interventions until short term weight is achieved;Long Term: Adherence to nutrition and physical activity/exercise program aimed toward attainment of established weight  goal;Weight Maintenance: Understanding of the daily nutrition guidelines, which includes 25-35% calories from fat,  7% or less cal from saturated fats, less than 200mg  cholesterol, less than 1.5gm of sodium, & 5 or more servings of fruits and vegetables daily;Understanding recommendations for meals to include 15-35% energy as protein, 25-35% energy from fat, 35-60% energy from carbohydrates, less than 200mg  of dietary cholesterol, 20-35 gm of total fiber daily;Understanding of distribution of calorie intake throughout the day with the consumption of 4-5 meals/snacks    Diabetes Yes    Intervention Provide education about signs/symptoms and action to take for hypo/hyperglycemia.;Provide education about proper nutrition, including hydration, and aerobic/resistive exercise prescription along with prescribed medications to achieve blood glucose in normal ranges: Fasting glucose 65-99 mg/dL    Expected Outcomes Short Term: Participant verbalizes understanding of the signs/symptoms and immediate care of hyper/hypoglycemia, proper foot care and importance of medication, aerobic/resistive exercise and nutrition plan for blood glucose control.;Long Term: Attainment of HbA1C < 7%.    Heart Failure Yes    Intervention Provide a combined exercise and nutrition program that is supplemented with education, support and counseling about heart failure. Directed toward relieving symptoms such as shortness of breath, decreased exercise tolerance, and extremity edema.    Expected Outcomes Improve functional capacity of life;Short term: Attendance in program 2-3 days a week with increased exercise capacity. Reported lower sodium intake. Reported increased fruit and vegetable intake. Reports medication compliance.;Short term: Daily weights obtained and reported for increase. Utilizing diuretic protocols set by physician.;Long term: Adoption of self-care skills and reduction of barriers for early signs and symptoms recognition and  intervention leading to self-care maintenance.    Hypertension Yes    Intervention Provide education on lifestyle modifcations including regular physical activity/exercise, weight management, moderate sodium restriction and increased consumption of fresh fruit, vegetables, and low fat dairy, alcohol moderation, and smoking cessation.;Monitor prescription use compliance.    Expected Outcomes Short Term: Continued assessment and intervention until BP is < 140/70mm HG in hypertensive participants. < 130/24mm HG in hypertensive participants with diabetes, heart failure or chronic kidney disease.;Long Term: Maintenance of blood pressure at goal levels.    Lipids Yes    Intervention Provide education and support for participant on nutrition & aerobic/resistive exercise along with prescribed medications to achieve LDL 70mg , HDL >40mg .    Expected Outcomes Short Term: Participant states understanding of desired cholesterol values and is compliant with medications prescribed. Participant is following exercise prescription and nutrition guidelines.;Long Term: Cholesterol controlled with medications as prescribed, with individualized exercise RX and with personalized nutrition plan. Value goals: LDL < 70mg , HDL > 40 mg.          Education:Diabetes - Individual verbal and written instruction to review signs/symptoms of diabetes, desired ranges of glucose level fasting, after meals and with exercise. Acknowledge that pre and post exercise glucose checks will be done for 3 sessions at entry of program. Flowsheet Row Cardiac Rehab from 11/08/2024 in [facility]  Date 11/08/24  Educator [facility]  Instruction Review Code 1- Verbalizes Understanding    Core Components/Risk Factors/Patient Goals Review:    Core Components/Risk Factors/Patient Goals at Discharge (Final Review):    ITP Comments:  ITP Comments     Row Name 11/08/24 0940           ITP Comments Completed program orientation  and . Initial ITP created and sent for review to Medical Director.          Comments: Initial ITP    [1]  Current Outpatient Medications:    Accu-Chek Softclix Lancets lancets, Use  as instructed 4 times daily DX e11.65, Disp: 100 each, Rfl: 3   amiodarone (PACERONE) 200 MG tablet, Take 200 mg by mouth., Disp: , Rfl:    amLODipine  (NORVASC ) 5 MG tablet, Take 5 mg by mouth daily., Disp: , Rfl:    aspirin  EC 81 MG tablet, Take 81 mg by mouth at bedtime., Disp: , Rfl:    atorvastatin  (LIPITOR) 40 MG tablet, TAKE 1 TABLET(40 MG) BY MOUTH DAILY AT 6 PM, Disp: 90 tablet, Rfl: 3   Blood Glucose Monitoring Suppl (ACCU-CHEK GUIDE ME) w/Device KIT, Use as directed  DxE11.65, Disp: 1 kit, Rfl: 0   empagliflozin  (JARDIANCE ) 10 MG TABS tablet, Take 1 tablet (10 mg total) by mouth daily., Disp: 90 tablet, Rfl: 3   glipiZIDE (GLUCOTROL XL) 2.5 MG 24 hr tablet, Take 2.5 mg by mouth 2 (two) times daily. 2 times daily before meals, Disp: , Rfl:    glucose blood (ACCU-CHEK GUIDE TEST) test strip, Use as instructed glucose 4 times daily, Disp: 100 each, Rfl: 3   metoprolol  tartrate (LOPRESSOR ) 50 MG tablet, Take 50 mg by mouth. (Patient taking differently: Take 50 mg by mouth 2 (two) times daily.), Disp: , Rfl:    oxyCODONE  (OXY IR/ROXICODONE ) 5 MG immediate release tablet, Take 5 mg by mouth 4 (four) times daily as needed., Disp: , Rfl:    senna-docusate (SENOKOT-S) 8.6-50 MG tablet, Take 1 tablet by mouth 2 (two) times daily., Disp: , Rfl:    furosemide (LASIX) 20 MG tablet, Take 20 mg by mouth 2 (two) times daily. (Patient not taking: Reported on 11/08/2024), Disp: , Rfl:    polyethylene glycol (MIRALAX  / GLYCOLAX ) 17 g packet, Take 17 g by mouth. (Patient not taking: Reported on 11/08/2024), Disp: , Rfl:    potassium chloride  SA (KLOR-CON  M) 20 MEQ tablet, Take 20 mEq by mouth. (Patient not taking: Reported on 11/08/2024), Disp: , Rfl:    sildenafil  (VIAGRA ) 100 MG tablet, Take 1 tablet (100 mg total) by  mouth daily as needed for erectile dysfunction (take 1 hour prior to sexual activity). (Patient not taking: Reported on 11/08/2024), Disp: 30 tablet, Rfl: 6 [2]  Social History Tobacco Use  Smoking Status Former   Current packs/day: 0.00   Types: Cigarettes   Quit date: 02/13/1993   Years since quitting: 31.7   Passive exposure: Past  Smokeless Tobacco Never

## 2024-11-08 NOTE — Progress Notes (Signed)
 Assessment start time: 10:27 AM  Digestive issues/concerns: no known food allergies   24-hours Recall: B: sausage, rye bread, coffee Snack: protein drink 3-4pm venison chili 8-9pm grapes, watermelon  Beverages coffee, water   Education r/t nutrition plan Niki drinks mostly water . He has coffee and an occasional pepsi. He eats 2-3 times per day. He drinks a protein shake daily to help him meet nutritional goals. Encouraged him to continue to drink it and eat 3 times per day. Discussed limiting sodium, he says he doesn't salt his food anymore. Will continue to monitor and support as he attends rehab, with goal of slowly introducing topics without overwhelming him with new information.    Goal 1: Eat 3 times per day, small frequent meals or nutrient dense snacks Goal 2: Reduce sodium intake to below 2300mg . Ideally 1500mg  per day.  Goal 3: Continue to drink protein shake daily  End time 10:42 AM

## 2024-11-08 NOTE — Patient Instructions (Signed)
 Patient Instructions  Patient Details  Name: Mark Wyatt MRN: [id number] Date of Birth: 08-Feb-1951 Referring Provider:  Wilburn Keller BROCKS, MD  Below are your personal goals for exercise, nutrition, and risk factors. Our goal is to help you stay on track towards obtaining and maintaining these goals. We will be discussing your progress on these goals with you throughout the program.  Initial Exercise Prescription:  Initial Exercise Prescription - 11/08/24 1100       Date of Initial Exercise RX and Referring Provider   Date 11/08/24    Referring Provider Dr. Keller Wilburn, MD      Oxygen   Oxygen Continuous      Treadmill   MPH 2.5    Grade 0    Minutes 15    METs 2.91      Recumbant Bike   Level 2    RPM 50    Watts 19    Minutes 15    METs 2.68      REL-XR   Level 2    Speed 50    Minutes 15    METs 2.68      T5 Nustep   Level 1    SPM 80    Minutes 15    METs 2.68      Prescription Details   Frequency (times per week) 2    Duration Progress to 30 minutes of continuous aerobic without signs/symptoms of physical distress      Intensity   THRR 40-80% of Max Heartrate 92-128    Ratings of Perceived Exertion 11-13    Perceived Dyspnea 0-4      Progression   Progression Continue to progress workloads to maintain intensity without signs/symptoms of physical distress.      Resistance Training   Training Prescription Yes    Weight 3 lb    Reps 10-15          Exercise Goals: Frequency: Be able to perform aerobic exercise two to three times per week in program working toward 2-5 days per week of home exercise.  Intensity: Work with a perceived exertion of 11 (fairly light) - 15 (hard) while following your exercise prescription.  We will make changes to your prescription with you as you progress through the program.   Duration: Be able to do 30 to 45 minutes of continuous aerobic exercise in addition to a 5 minute warm-up and a 5 minute cool-down routine.    Nutrition Goals: Your personal nutrition goals will be established when you do your nutrition analysis with the dietician.  The following are general nutrition guidelines to follow: Cholesterol < 200mg /day Sodium < 1500mg /day Fiber: Men over 50 yrs - 30 grams per day  Personal Goals:  Personal Goals and Risk Factors at Admission - 11/08/24 0957       Core Components/Risk Factors/Patient Goals on Admission    Weight Management Yes;Weight Maintenance    Intervention Weight Management: Develop a combined nutrition and exercise program designed to reach desired caloric intake, while maintaining appropriate intake of nutrient and fiber, sodium and fats, and appropriate energy expenditure required for the weight goal.;Weight Management: Provide education and appropriate resources to help participant work on and attain dietary goals.;Weight Management/Obesity: Establish reasonable short term and long term weight goals.    Expected Outcomes Short Term: Continue to assess and modify interventions until short term weight is achieved;Long Term: Adherence to nutrition and physical activity/exercise program aimed toward attainment of established weight goal;Weight Maintenance: Understanding of the daily  nutrition guidelines, which includes 25-35% calories from fat, 7% or less cal from saturated fats, less than 200mg  cholesterol, less than 1.5gm of sodium, & 5 or more servings of fruits and vegetables daily;Understanding recommendations for meals to include 15-35% energy as protein, 25-35% energy from fat, 35-60% energy from carbohydrates, less than 200mg  of dietary cholesterol, 20-35 gm of total fiber daily;Understanding of distribution of calorie intake throughout the day with the consumption of 4-5 meals/snacks    Diabetes Yes    Intervention Provide education about signs/symptoms and action to take for hypo/hyperglycemia.;Provide education about proper nutrition, including hydration, and aerobic/resistive  exercise prescription along with prescribed medications to achieve blood glucose in normal ranges: Fasting glucose 65-99 mg/dL    Expected Outcomes Short Term: Participant verbalizes understanding of the signs/symptoms and immediate care of hyper/hypoglycemia, proper foot care and importance of medication, aerobic/resistive exercise and nutrition plan for blood glucose control.;Long Term: Attainment of HbA1C < 7%.    Heart Failure Yes    Intervention Provide a combined exercise and nutrition program that is supplemented with education, support and counseling about heart failure. Directed toward relieving symptoms such as shortness of breath, decreased exercise tolerance, and extremity edema.    Expected Outcomes Improve functional capacity of life;Short term: Attendance in program 2-3 days a week with increased exercise capacity. Reported lower sodium intake. Reported increased fruit and vegetable intake. Reports medication compliance.;Short term: Daily weights obtained and reported for increase. Utilizing diuretic protocols set by physician.;Long term: Adoption of self-care skills and reduction of barriers for early signs and symptoms recognition and intervention leading to self-care maintenance.    Hypertension Yes    Intervention Provide education on lifestyle modifcations including regular physical activity/exercise, weight management, moderate sodium restriction and increased consumption of fresh fruit, vegetables, and low fat dairy, alcohol moderation, and smoking cessation.;Monitor prescription use compliance.    Expected Outcomes Short Term: Continued assessment and intervention until BP is < 140/68mm HG in hypertensive participants. < 130/18mm HG in hypertensive participants with diabetes, heart failure or chronic kidney disease.;Long Term: Maintenance of blood pressure at goal levels.    Lipids Yes    Intervention Provide education and support for participant on nutrition & aerobic/resistive  exercise along with prescribed medications to achieve LDL 70mg , HDL >40mg .    Expected Outcomes Short Term: Participant states understanding of desired cholesterol values and is compliant with medications prescribed. Participant is following exercise prescription and nutrition guidelines.;Long Term: Cholesterol controlled with medications as prescribed, with individualized exercise RX and with personalized nutrition plan. Value goals: LDL < 70mg , HDL > 40 mg.         Exercise Goals and Review:  Exercise Goals     Row Name 11/08/24 1119             Exercise Goals   Increase Physical Activity Yes       Intervention Provide advice, education, support and counseling about physical activity/exercise needs.;Develop an individualized exercise prescription for aerobic and resistive training based on initial evaluation findings, risk stratification, comorbidities and participant's personal goals.       Expected Outcomes Short Term: Attend rehab on a regular basis to increase amount of physical activity.;Long Term: Add in home exercise to make exercise part of routine and to increase amount of physical activity.;Long Term: Exercising regularly at least 3-5 days a week.       Increase Strength and Stamina Yes       Intervention Provide advice, education, support and counseling about physical activity/exercise  needs.;Develop an individualized exercise prescription for aerobic and resistive training based on initial evaluation findings, risk stratification, comorbidities and participant's personal goals.       Expected Outcomes Short Term: Increase workloads from initial exercise prescription for resistance, speed, and METs.;Short Term: Perform resistance training exercises routinely during rehab and add in resistance training at home;Long Term: Improve cardiorespiratory fitness, muscular endurance and strength as measured by increased METs and functional capacity ( )       Able to understand and use  rate of perceived exertion (RPE) scale Yes       Intervention Provide education and explanation on how to use RPE scale       Expected Outcomes Short Term: Able to use RPE daily in rehab to express subjective intensity level;Long Term:  Able to use RPE to guide intensity level when exercising independently       Able to understand and use Dyspnea scale Yes       Intervention Provide education and explanation on how to use Dyspnea scale       Expected Outcomes Short Term: Able to use Dyspnea scale daily in rehab to express subjective sense of shortness of breath during exertion;Long Term: Able to use Dyspnea scale to guide intensity level when exercising independently       Knowledge and understanding of Target Heart Rate Range (THRR) Yes       Intervention Provide education and explanation of THRR including how the numbers were predicted and where they are located for reference       Expected Outcomes Short Term: Able to state/look up THRR;Long Term: Able to use THRR to govern intensity when exercising independently;Short Term: Able to use daily as guideline for intensity in rehab       Able to check pulse independently Yes       Intervention Provide education and demonstration on how to check pulse in carotid and radial arteries.;Review the importance of being able to check your own pulse for safety during independent exercise       Expected Outcomes Short Term: Able to explain why pulse checking is important during independent exercise;Long Term: Able to check pulse independently and accurately       Understanding of Exercise Prescription Yes       Intervention Provide education, explanation, and written materials on patient's individual exercise prescription       Expected Outcomes Short Term: Able to explain program exercise prescription;Long Term: Able to explain home exercise prescription to exercise independently

## 2024-11-16 ENCOUNTER — Encounter: Admitting: Emergency Medicine

## 2024-11-16 DIAGNOSIS — Z951 Presence of aortocoronary bypass graft: Secondary | ICD-10-CM

## 2024-11-16 LAB — GLUCOSE, CAPILLARY
Glucose-Capillary: 150 mg/dL — ABNORMAL HIGH (ref 70–99)
Glucose-Capillary: 131 mg/dL — ABNORMAL HIGH (ref 70–99)

## 2024-11-16 NOTE — Progress Notes (Signed)
 Daily Session Note  Patient Details  Name: Mark Wyatt MRN: [id number] Date of Birth: 09/01/1951 Referring Provider:   Flowsheet Row Cardiac Rehab from 11/08/2024 in [facility]  Referring Provider Dr. Keller Paterson, MD    Encounter Date: 11/16/2024  Check In:  Session Check In - 11/16/24 0935       Check-In   Supervising physician immediately available to respond to emergencies See telemetry face sheet for immediately available ER MD    Location [facility]-Cardiac & Pulmonary Rehab    Staff Present Leita Franks RN,BSN;Maxon Conetta BS, Exercise Physiologist;Noah Tickle, BS, Exercise Physiologist;Margaret Best, MS, Exercise Physiologist    Virtual Visit No    Medication changes reported     No    Fall or balance concerns reported    No    Warm-up and Cool-down Performed on first and last piece of equipment    Resistance Training Performed Yes    VAD Patient? No    PAD/SET Patient? No      Pain Assessment   Currently in Pain? No/denies             Tobacco Use History[1]  Goals Met:  Independence with exercise equipment Exercise tolerated well No report of concerns or symptoms today Strength training completed today  Goals Unmet:  Not Applicable  Comments: First full day of exercise!  Patient was oriented to gym and equipment including functions, settings, policies, and procedures.  Patient's individual exercise prescription and treatment plan were reviewed.  All starting workloads were established based on the results of the 6 minute walk test done at initial orientation visit.  The plan for exercise progression was also introduced and progression will be customized based on patient's performance and goals.    Dr. Oneil Pinal is Medical Director for [facility] Cardiac Rehabilitation.  Dr. Fuad Aleskerov is Medical Director for [facility] Pulmonary Rehabilitation.    [1]  Social History Tobacco Use  Smoking Status Former   Current packs/day: 0.00    Types: Cigarettes   Quit date: 02/13/1993   Years since quitting: 31.7   Passive exposure: Past  Smokeless Tobacco Never

## 2024-11-17 ENCOUNTER — Encounter: Payer: Self-pay | Admitting: Nurse Practitioner

## 2024-11-17 ENCOUNTER — Ambulatory Visit (INDEPENDENT_AMBULATORY_CARE_PROVIDER_SITE_OTHER): Admitting: Nurse Practitioner

## 2024-11-17 VITALS — BP 132/74 | HR 83 | Temp 96.0°F | Resp 16 | Ht 70.5 in | Wt 193.5 lb

## 2024-11-17 DIAGNOSIS — I25810 Atherosclerosis of coronary artery bypass graft(s) without angina pectoris: Secondary | ICD-10-CM

## 2024-11-17 DIAGNOSIS — E1165 Type 2 diabetes mellitus with hyperglycemia: Secondary | ICD-10-CM | POA: Diagnosis not present

## 2024-11-17 DIAGNOSIS — I152 Hypertension secondary to endocrine disorders: Secondary | ICD-10-CM

## 2024-11-17 DIAGNOSIS — Z4802 Encounter for removal of sutures: Secondary | ICD-10-CM

## 2024-11-17 DIAGNOSIS — Z951 Presence of aortocoronary bypass graft: Secondary | ICD-10-CM | POA: Insufficient documentation

## 2024-11-17 DIAGNOSIS — I5032 Chronic diastolic (congestive) heart failure: Secondary | ICD-10-CM

## 2024-11-17 DIAGNOSIS — E1169 Type 2 diabetes mellitus with other specified complication: Secondary | ICD-10-CM

## 2024-11-17 DIAGNOSIS — E1159 Type 2 diabetes mellitus with other circulatory complications: Secondary | ICD-10-CM

## 2024-11-17 DIAGNOSIS — Z0001 Encounter for general adult medical examination with abnormal findings: Secondary | ICD-10-CM

## 2024-11-17 DIAGNOSIS — D500 Iron deficiency anemia secondary to blood loss (chronic): Secondary | ICD-10-CM | POA: Diagnosis not present

## 2024-11-17 DIAGNOSIS — E785 Hyperlipidemia, unspecified: Secondary | ICD-10-CM | POA: Diagnosis not present

## 2024-11-17 LAB — HEMOGLOBIN A1C: Hemoglobin A1C: 7.2

## 2024-11-17 LAB — ESTIMATED GFR: eGFR: 53

## 2024-11-17 LAB — CREATININE: Creatinine: 1.4 — AB (ref 0.6–1.3)

## 2024-11-17 MED ORDER — EMPAGLIFLOZIN 25 MG PO TABS: 25.0000 mg | ORAL_TABLET | Freq: Every day | ORAL | 1 refills | Status: AC

## 2024-11-17 NOTE — Progress Notes (Signed)
 [facility] [address] [place], [place] [address]  Internal MEDICINE  Office Visit Note  Patient Name: Mark Wyatt  [id number]  [id number]  Date of Service: 11/17/2024  Chief Complaint  Patient presents with   Diabetes   Gastroesophageal Reflux   Hypertension   Hyperlipidemia   Medicare Wellness    HPI Jash presents for a medicare annual wellness visit.  Well-appearing 74 y.o. male with hypertension, CAD, GERD, diabetes, and high cholesterol. He has a history of prostate cancer, NSTEMI, GI bleed and anemia. He is S/p CABGx3 which was done on 10/05/2024. He is currently in cardiopulmonary rehab.  Routine CRC screening: due in 2032 Eye exam:appointment scheduled in march 24th this tear.  foot exam: done Labs: up to date for now  New or worsening pain: none  Other concerns: none Was told by endocrinology and CT surgery that he needs to be on glipizide along with the jardiance . His last A1c was 7.2 in the hospital in December. His glucose readings since his hospital stay have been 130s or lower. He is currently only on 10 mg daily of jardiance .   He has 1 left over suture in his left groin area that has not fallen out or dissolved. I removed this suture today with aseptic technique.      11/17/2024   10:19 AM 09/16/2023    2:15 PM 09/10/2022    3:11 PM  MMSE - Mini Mental State Exam  Orientation to time 5 5 5   Orientation to Place 0 5 5  Registration 3 3 3   Attention/ Calculation 5 5 5   Recall 3 3 3   Language- name 2 objects 2 2 2   Language- repeat 1 1 1   Language- follow 3 step command 3 3 3   Language- read & follow direction 1 1 1   Write a sentence 1 1 1   Copy design 1 1 1   Total score 25 30 30     Functional Status Survey: Is the patient deaf or have difficulty hearing?: No Does the patient have difficulty seeing, even when wearing glasses/contacts?: No Does the patient have difficulty concentrating, remembering, or making decisions?: No Does the  patient have difficulty walking or climbing stairs?: No Does the patient have difficulty dressing or bathing?: No Does the patient have difficulty doing errands alone such as visiting a doctor's office or shopping?: No     05/17/2024    8:25 AM 10/19/2024   11:41 AM 11/08/2024    9:55 AM 11/17/2024   10:17 AM 11/17/2024   10:18 AM  Fall Risk  Falls in the past year? 0 0 1 0 1  Was there an injury with Fall?   0 0 0  Fall Risk Category Calculator   1 0 1  Patient at Risk for Falls Due to No Fall Risks  History of fall(s)    Fall risk Follow up Falls evaluation completed  Falls evaluation completed;Education provided;Falls prevention discussed Falls evaluation completed Falls evaluation completed       11/17/2024   10:18 AM  Depression screen PHQ 2/9  Decreased Interest 0  Down, Depressed, Hopeless 0  PHQ - 2 Score 0        Current Medication: Outpatient Encounter Medications as of 11/17/2024  Medication Sig   empagliflozin  (JARDIANCE ) 25 MG TABS tablet Take 1 tablet (25 mg total) by mouth daily.   Accu-Chek Softclix Lancets lancets Use as instructed 4 times daily DX e11.65   amiodarone (PACERONE) 200 MG tablet Take 200 mg by  mouth.   amLODipine  (NORVASC ) 5 MG tablet Take 5 mg by mouth daily.   aspirin  EC 81 MG tablet Take 81 mg by mouth at bedtime.   atorvastatin  (LIPITOR) 40 MG tablet TAKE 1 TABLET(40 MG) BY MOUTH DAILY AT 6 PM   Blood Glucose Monitoring Suppl (ACCU-CHEK GUIDE ME) w/Device KIT Use as directed  DxE11.65   glucose blood (ACCU-CHEK GUIDE TEST) test strip Use as instructed glucose 4 times daily   metoprolol  tartrate (LOPRESSOR ) 50 MG tablet Take 50 mg by mouth. (Patient taking differently: Take 50 mg by mouth 2 (two) times daily.)   senna-docusate (SENOKOT-S) 8.6-50 MG tablet Take 1 tablet by mouth 2 (two) times daily.   [DISCONTINUED] empagliflozin  (JARDIANCE ) 10 MG TABS tablet Take 1 tablet (10 mg total) by mouth daily.   [DISCONTINUED] furosemide (LASIX) 20 MG  tablet Take 20 mg by mouth 2 (two) times daily. (Patient not taking: Reported on 11/08/2024)   [DISCONTINUED] glipiZIDE (GLUCOTROL XL) 2.5 MG 24 hr tablet Take 2.5 mg by mouth 2 (two) times daily. 2 times daily before meals   [DISCONTINUED] oxyCODONE  (OXY IR/ROXICODONE ) 5 MG immediate release tablet Take 5 mg by mouth 4 (four) times daily as needed.   [DISCONTINUED] polyethylene glycol (MIRALAX  / GLYCOLAX ) 17 g packet Take 17 g by mouth. (Patient not taking: Reported on 11/08/2024)   [DISCONTINUED] sildenafil  (VIAGRA ) 100 MG tablet Take 1 tablet (100 mg total) by mouth daily as needed for erectile dysfunction (take 1 hour prior to sexual activity). (Patient not taking: Reported on 11/08/2024)   No facility-administered encounter medications on file as of 11/17/2024.    Surgical History: Past Surgical History:  Procedure Laterality Date   ANAL FISTULOTOMY     APPENDECTOMY     as a child   CATARACT EXTRACTION Right 02/2022   CATARACT EXTRACTION Left 03/2022   COLONOSCOPY WITH PROPOFOL  N/A 02/13/2021   Procedure: COLONOSCOPY WITH PROPOFOL ;  Surgeon: Unk Corinn Skiff, MD;  Location: [facility] ENDOSCOPY;  Service: Gastroenterology;  Laterality: N/A;   CORONARY ARTERY BYPASS GRAFT  10/05/2024   CORONARY STENT INTERVENTION N/A 02/24/2017   Procedure: Coronary Stent Intervention;  Surgeon: Florencio Cara BIRCH, MD;  Location: [facility] INVASIVE CV LAB;  Service: Cardiovascular;  Laterality: N/A;   ESOPHAGOGASTRODUODENOSCOPY N/A 02/10/2021   Procedure: ESOPHAGOGASTRODUODENOSCOPY (EGD);  Surgeon: Therisa Bi, MD;  Location: [facility] ENDOSCOPY;  Service: Gastroenterology;  Laterality: N/A;   ESOPHAGOGASTRODUODENOSCOPY (EGD) WITH PROPOFOL  N/A 01/25/2021   Procedure: ESOPHAGOGASTRODUODENOSCOPY (EGD) WITH PROPOFOL ;  Surgeon: Janalyn Keene NOVAK, MD;  Location: [facility] ENDOSCOPY;  Service: Endoscopy;  Laterality: N/A;   GIVENS CAPSULE STUDY N/A 02/21/2021   Procedure: GIVENS CAPSULE STUDY;  Surgeon: Janalyn Keene NOVAK,  MD;  Location: [facility] ENDOSCOPY;  Service: Endoscopy;  Laterality: N/A;   HOLEP-LASER ENUCLEATION OF THE PROSTATE WITH MORCELLATION N/A 10/31/2023   Procedure: HOLEP-LASER ENUCLEATION OF THE PROSTATE WITH MORCELLATION;  Surgeon: Francisca Redell BROCKS, MD;  Location: [facility] ORS;  Service: Urology;  Laterality: N/A;   LEFT HEART CATH AND CORONARY ANGIOGRAPHY N/A 02/24/2017   Procedure: Left Heart Cath and Coronary Angiography;  Surgeon: Bosie Vinie LABOR, MD;  Location: [facility] INVASIVE CV LAB;  Service: Cardiovascular;  Laterality: N/A;   LEFT HEART CATH AND CORONARY ANGIOGRAPHY N/A 09/08/2024   Procedure: LEFT HEART CATH AND CORONARY ANGIOGRAPHY;  Surgeon: Florencio Cara BIRCH, MD;  Location: [facility] INVASIVE CV LAB;  Service: Cardiovascular;  Laterality: N/A;   TONSILLECTOMY     TRIGGER FINGER RELEASE Right 10/2021    Medical History: Past Medical History:  Diagnosis Date   Anal fistula    a.) s/p anal fistulotomy   Anemia    Anginal pain    Aortic atherosclerosis    AVM (arteriovenous malformation) of duodenum    Bilateral carotid artery disease 09/08/2020   a.) carotid doppler 09/08/2020: 50-70% RICA, < 50% LICA   Bilateral renal cysts    Coronary artery disease 02/24/2017   a.) LHC/PCI 02/24/2017: 95% dRCA-1 (2.5 x 38 mm Xience Alpine DES), 60% dRCA-2, 75% RPDA (2.5 x 38 mm Xience Alpine DES), 60% OM1, 70% p-mLCx, 70% D1, 65% pLAD   DDD (degenerative disc disease), cervical    DDD (degenerative disc disease), thoracolumbar    Duodenitis    Erectile dysfunction    a.) on PDE5i (tadalafil )   Esophagitis    Gastric polyp    GERD (gastroesophageal reflux disease)    Gout    History of bilateral cataract extraction 2023   Hyperlipidemia    Hypertension    NSTEMI (non-ST elevated myocardial infarction) (HCC) 02/21/2017   a,) troponins were trended: <0.03 --> 2.98 --> 4.93 ng/mL; b.) LHC/PCI 02/24/2017 --> 2.5 x 38 mm Xience Alpine DES x 2 to dRCA-1 and RPDA (non-overlapping)   Osteoarthritis     Overactive bladder    Perirectal abscess    RBBB (right bundle branch block)    Trigger finger of all digits of right hand    Type 2 diabetes, diet controlled (HCC)    Ventricular bigeminy     Family History: Family History  Problem Relation Age of Onset   Diabetes Mother     Social History   Socioeconomic History   Marital status: Married    Spouse name: Sherrell   Number of children: Not on file   Years of education: Not on file   Highest education level: Not on file  Occupational History   Not on file  Tobacco Use   Smoking status: Former    Current packs/day: 0.00    Types: Cigarettes    Quit date: 02/13/1993    Years since quitting: 31.7    Passive exposure: Past   Smokeless tobacco: Never  Vaping Use   Vaping status: Never Used  Substance and Sexual Activity   Alcohol use: Yes    Alcohol/week: 6.0 standard drinks of alcohol    Types: 6 Cans of beer per week    Comment: 1 or 2 with dinner   Drug use: No   Sexual activity: Not on file  Other Topics Concern   Not on file  Social History Narrative   Not on file   Social Drivers of Health   Tobacco Use: Medium Risk (11/17/2024)   Patient History    Smoking Tobacco Use: Former    Smokeless Tobacco Use: Never    Passive Exposure: Past  Physicist, Medical Strain: Not on file  Food Insecurity: Not on file  Transportation Needs: No Transportation Needs (10/05/2024)   [facility]   PRAPARE - Transportation    In the past 12 months, has lack of transportation kept you from medical appointments or from getting medications?: No    Lack of Transportation (Non-Medical): No  Physical Activity: Not on file  Stress: Not on file  Social Connections: Not on file  Intimate Partner Violence: Not on file  Depression (PHQ2-9): Low Risk (11/17/2024)   Depression (PHQ2-9)    PHQ-2 Score: 0  Alcohol Screen: Low Risk (05/01/2022)   Alcohol Screen    Last Alcohol Screening Score (AUDIT):  4   Housing: Unknown (10/22/2024)   Received from [facility]   Epic    Unable to Pay for Housing in the Last Year: Not on file    Number of Times Moved in the Last Year: Not on file    At any time in the past 12 months, were you homeless or living in a shelter (including now)?: No  Utilities: Not on file  Health Literacy: Not on file      Review of Systems  Constitutional:  Negative for activity change, appetite change, chills, fatigue, fever and unexpected weight change.  HENT: Negative.  Negative for congestion, ear pain, rhinorrhea, sore throat and trouble swallowing.   Eyes: Negative.   Respiratory: Negative.  Negative for cough, chest tightness, shortness of breath and wheezing.   Cardiovascular: Negative.  Negative for chest pain and palpitations.  Gastrointestinal: Negative.  Negative for abdominal pain, blood in stool, constipation, diarrhea, nausea and vomiting.  Endocrine: Negative.   Genitourinary: Negative.  Negative for difficulty urinating, dysuria, frequency, hematuria and urgency.  Musculoskeletal:  Positive for arthralgias. Negative for back pain, joint swelling, myalgias and neck pain.  Skin: Negative.  Negative for rash and wound.  Allergic/Immunologic: Negative.  Negative for immunocompromised state.  Neurological: Negative.  Negative for dizziness, seizures, numbness and headaches.  Hematological: Negative.   Psychiatric/Behavioral: Negative.  Negative for behavioral problems, self-injury and suicidal ideas. The patient is not nervous/anxious.     Vital Signs: BP 132/74   Pulse 83   Temp (!) 95.6 F (35.3 C)   Resp 16   Ht 5' 10.5 (1.791 m)   Wt 193 lb 8 oz (87.8 kg)   SpO2 95%   BMI 27.37 kg/m    Physical Exam Vitals reviewed.  Constitutional:      General: He is awake. He is not in acute distress.    Appearance: Normal appearance. He is well-developed, well-groomed and overweight. He is not ill-appearing or diaphoretic.  HENT:      Head: Normocephalic and atraumatic.     Right Ear: Tympanic membrane, ear canal and external ear normal.     Left Ear: Tympanic membrane, ear canal and external ear normal.     Nose: Nose normal. No congestion or rhinorrhea.     Mouth/Throat:     Lips: Pink.     Mouth: Mucous membranes are moist.     Pharynx: Oropharynx is clear. Uvula midline. No oropharyngeal exudate or posterior oropharyngeal erythema.  Eyes:     General: Lids are normal. Vision grossly intact. Gaze aligned appropriately. No scleral icterus.       Right eye: No discharge.        Left eye: No discharge.     Extraocular Movements: Extraocular movements intact.     Conjunctiva/sclera: Conjunctivae normal.     Pupils: Pupils are equal, round, and reactive to light.     Funduscopic exam:    Right eye: Red reflex present.        Left eye: Red reflex present. Neck:     Thyroid : No thyromegaly.     Vascular: No JVD.     Trachea: Trachea and phonation normal. No tracheal deviation.  Cardiovascular:     Rate and Rhythm: Normal rate and regular rhythm.     Pulses:          Carotid pulses are 3+ on the right side and 3+ on the left side.      Radial pulses are 2+ on the right  side and 2+ on the left side.       Dorsalis pedis pulses are 2+ on the right side and 2+ on the left side.       Posterior tibial pulses are 2+ on the right side and 2+ on the left side.     Heart sounds: Normal heart sounds, S1 normal and S2 normal. No murmur heard.    No friction rub. No gallop.  Pulmonary:     Effort: Pulmonary effort is normal. No accessory muscle usage or respiratory distress.     Breath sounds: Normal breath sounds and air entry. No stridor. No wheezing or rales.  Chest:     Chest wall: No tenderness.  Abdominal:     General: Bowel sounds are normal. There is no distension.     Palpations: Abdomen is soft. There is no shifting dullness, fluid wave, mass or pulsatile mass.     Tenderness: There is no abdominal tenderness.  There is no guarding or rebound.  Musculoskeletal:        General: No tenderness.     Cervical back: Normal range of motion and neck supple.     Right lower leg: No edema.     Left lower leg: No edema.     Right foot: Normal range of motion. No deformity, bunion, Charcot foot, foot drop or prominent metatarsal heads.     Left foot: Normal range of motion. No deformity, bunion, Charcot foot, foot drop or prominent metatarsal heads.  Feet:     Right foot:     Protective Sensation: 6 sites tested.  6 sites sensed.     Skin integrity: Dry skin present. No ulcer, blister, skin breakdown, erythema, warmth, callus or fissure.     Toenail Condition: Right toenails are abnormally thick and long.     Left foot:     Protective Sensation: 6 sites tested.  6 sites sensed.     Skin integrity: Dry skin present. No ulcer, blister, skin breakdown, erythema, warmth, callus or fissure.     Toenail Condition: Left toenails are abnormally thick and long.  Lymphadenopathy:     Cervical: No cervical adenopathy.  Skin:    General: Skin is warm and dry.     Capillary Refill: Capillary refill takes less than 2 seconds.     Coloration: Skin is not pale.     Findings: No erythema or rash.  Neurological:     Mental Status: He is alert and oriented to person, place, and time.     Cranial Nerves: No cranial nerve deficit.     Motor: No abnormal muscle tone.     Coordination: Coordination normal.     Gait: Gait normal.     Deep Tendon Reflexes: Reflexes are normal and symmetric.  Psychiatric:        Mood and Affect: Mood and affect normal.        Behavior: Behavior normal. Behavior is cooperative.        Thought Content: Thought content normal.        Judgment: Judgment normal.        Assessment/Plan: 1. Encounter for Medicare annual examination with abnormal findings (Primary) Age-appropriate preventive screenings and vaccinations discussed. Routine labs for health maintenance are up to date for now.  Will check urine ACR at next visit. PHM updated.    2. Type 2 diabetes mellitus with other specified complication, without long-term current use of insulin  (HCC) Increase jardiance  to 25 mg daily. He does not need  to take glipizide. We will recheck his A1c in the office in march. He will also need to have his urine ACR checked at next visit.  - empagliflozin  (JARDIANCE ) 25 MG TABS tablet; Take 1 tablet (25 mg total) by mouth daily.  Dispense: 90 tablet; Refill: 1 - Creatinine - Estimated GFR - Hemoglobin A1C w/out eAG  3. CHF (congestive heart failure), NYHA class II, chronic, diastolic (HCC) Increase jardiance  to 25 mg daily. Follow up in march  - empagliflozin  (JARDIANCE ) 25 MG TABS tablet; Take 1 tablet (25 mg total) by mouth daily.  Dispense: 90 tablet; Refill: 1  4. Hypertension associated with diabetes (HCC) Stable, continue metoprolol  and amlodipine  as prescribed.   5. Hyperlipidemia associated with type 2 diabetes mellitus (HCC) Continue atorvastatin  as prescribed. Continue low cholesterol low fat diet and continue physical activity as tolerated, be considerate of prescribed weight restrictions per cardiology and CT surgery.   6. Iron deficiency anemia due to chronic blood loss Doing well, most recent labs from [facility] in December are stable.   7. S/P CABG x 3 Surgical incision on chest is well healed and he is doing well. Currently going to cardiopulmonary rehab.   8. Encounter for removal of sutures 1 suture removed from left groin healed incision. No complications.      General Counseling: Brallan verbalizes understanding of the findings of todays visit and agrees with plan of treatment. I have discussed any further diagnostic evaluation that may be needed or ordered today. We also reviewed his medications today. he has been encouraged to call the office with any questions or concerns that should arise related to todays visit.    Orders Placed This Encounter  Procedures    Urine Microalbumin w/creat. ratio    Meds ordered this encounter  Medications   empagliflozin  (JARDIANCE ) 25 MG TABS tablet    Sig: Take 1 tablet (25 mg total) by mouth daily.    Dispense:  90 tablet    Refill:  1    Discontinue 10 mg dose, note increased dose, start asap. Discontinue glipizide.    Return in about 2 months (around 01/15/2025) for F/U, Recheck A1C, Brenee Gajda PCP.   Total time spent:30 Minutes Time spent includes review of chart, medications, test results, and follow up plan with the patient.   [place] Controlled Substance Database was reviewed by me.  This patient was seen by Mardy Maxin, FNP-C in collaboration with Dr. Sigrid Bathe as a part of collaborative care agreement.  Jaydalynn Olivero R. Maxin, MSN, FNP-C Internal medicine

## 2024-11-17 NOTE — Patient Instructions (Signed)
 Jardiance  dose increased to 25 mg daily.  May take 2 10-mg tablets daily until you get the 25 mg tablet then switch to the 25 mg tablet and stop the 10 mg tablets.

## 2024-11-18 ENCOUNTER — Encounter: Admitting: Emergency Medicine

## 2024-11-18 ENCOUNTER — Encounter

## 2024-11-18 DIAGNOSIS — Z951 Presence of aortocoronary bypass graft: Secondary | ICD-10-CM

## 2024-11-18 LAB — GLUCOSE, CAPILLARY
Glucose-Capillary: 151 mg/dL — ABNORMAL HIGH (ref 70–99)
Glucose-Capillary: 119 mg/dL — ABNORMAL HIGH (ref 70–99)

## 2024-11-18 NOTE — Progress Notes (Signed)
 Daily Session Note  Patient Details  Name: Mark Wyatt MRN: [id number] Date of Birth: July 19, 1951 Referring Provider:   Flowsheet Row Cardiac Rehab from 11/08/2024 in [facility] Cardiac [facility] Pulmonary Rehab  Referring Provider Dr. Keller Paterson, MD    Encounter Date: 11/18/2024  Check In:  Session Check In - 11/18/24 0942       Check-In   Supervising physician immediately available to respond to emergencies See telemetry face sheet for immediately available ER MD    Location [facility]-Cardiac & Pulmonary Rehab    Staff Present Leita Franks RN,BSN;Maxon Conetta BS, Exercise Physiologist;Margaret Best, MS, Exercise Physiologist;Jason Elnor RDN,LDN    Virtual Visit No    Medication changes reported     No    Fall or balance concerns reported    No    Warm-up and Cool-down Performed on first and last piece of equipment    Resistance Training Performed Yes    VAD Patient? No    PAD/SET Patient? No      Pain Assessment   Currently in Pain? No/denies             Tobacco Use History[1]  Goals Met:  Independence with exercise equipment Exercise tolerated well No report of concerns or symptoms today Strength training completed today  Goals Unmet:  Not Applicable  Comments: Pt able to follow exercise prescription today without complaint.  Will continue to monitor for progression.    Dr. Oneil Pinal is Medical Director for [facility] Cardiac Rehabilitation.  Dr. Fuad Aleskerov is Medical Director for [facility] Pulmonary Rehabilitation.    [1]  Social History Tobacco Use  Smoking Status Former   Current packs/day: 0.00   Types: Cigarettes   Quit date: 02/13/1993   Years since quitting: 31.7   Passive exposure: Past  Smokeless Tobacco Never

## 2024-11-23 ENCOUNTER — Encounter

## 2024-11-23 DIAGNOSIS — Z951 Presence of aortocoronary bypass graft: Secondary | ICD-10-CM

## 2024-11-23 LAB — GLUCOSE, CAPILLARY: Glucose-Capillary: 162 mg/dL — ABNORMAL HIGH (ref 70–99)

## 2024-11-25 ENCOUNTER — Encounter: Admitting: Emergency Medicine

## 2024-11-25 ENCOUNTER — Encounter

## 2024-11-25 DIAGNOSIS — Z951 Presence of aortocoronary bypass graft: Secondary | ICD-10-CM

## 2024-11-25 NOTE — Progress Notes (Signed)
 Daily Session Note  Patient Details  Name: Mark Wyatt MRN: [id number] Date of Birth: 1950/11/16 Referring Provider:   Flowsheet Row Cardiac Rehab from 11/08/2024 in [facility] Cardiac and Pulmonary Rehab  Referring Provider Dr. Keller Paterson, MD    Encounter Date: 11/25/2024  Check In:  Session Check In - 11/25/24 0926       Check-In   Supervising physician immediately available to respond to emergencies See telemetry face sheet for immediately available ER MD    Location [facility]-Cardiac & Pulmonary Rehab    Staff Present Leita Franks RN,BSN;[facility] BS, Exercise Physiologist;Noah Tickle, BS, Exercise Physiologist    Virtual Visit No    Medication changes reported     No    Fall or balance concerns reported    No    Warm-up and Cool-down Performed on first and last piece of equipment    Resistance Training Performed Yes    VAD Patient? No    PAD/SET Patient? No      Pain Assessment   Currently in Pain? No/denies             Tobacco Use History[1]  Goals Met:  Independence with exercise equipment Exercise tolerated well No report of concerns or symptoms today Strength training completed today  Goals Unmet:  Not Applicable  Comments: Pt able to follow exercise prescription today without complaint.  Will continue to monitor for progression.    Dr. Oneil Pinal is Medical Director for [facility] Cardiac Rehabilitation.  Dr. Fuad Aleskerov is Medical Director for [facility] Pulmonary Rehabilitation.    [1]  Social History Tobacco Use  Smoking Status Former   Current packs/day: 0.00   Types: Cigarettes   Quit date: 02/13/1993   Years since quitting: 31.8   Passive exposure: Past  Smokeless Tobacco Never

## 2024-11-30 ENCOUNTER — Encounter

## 2024-12-02 ENCOUNTER — Encounter

## 2024-12-07 ENCOUNTER — Encounter

## 2024-12-07 ENCOUNTER — Ambulatory Visit: Payer: TRICARE For Life (TFL) | Admitting: Dermatology

## 2024-12-09 ENCOUNTER — Encounter

## 2024-12-14 ENCOUNTER — Encounter

## 2024-12-16 ENCOUNTER — Encounter

## 2024-12-21 ENCOUNTER — Encounter

## 2024-12-23 ENCOUNTER — Encounter

## 2024-12-28 ENCOUNTER — Encounter

## 2024-12-30 ENCOUNTER — Encounter

## 2025-01-04 ENCOUNTER — Encounter

## 2025-01-06 ENCOUNTER — Encounter

## 2025-01-11 ENCOUNTER — Encounter

## 2025-01-13 ENCOUNTER — Encounter

## 2025-01-17 ENCOUNTER — Ambulatory Visit: Admitting: Nurse Practitioner

## 2025-01-18 ENCOUNTER — Encounter

## 2025-01-20 ENCOUNTER — Encounter

## 2025-01-25 ENCOUNTER — Encounter

## 2025-01-27 ENCOUNTER — Encounter

## 2025-02-01 ENCOUNTER — Encounter

## 2025-02-03 ENCOUNTER — Encounter

## 2025-02-08 ENCOUNTER — Encounter

## 2025-02-10 ENCOUNTER — Encounter

## 2025-02-15 ENCOUNTER — Encounter

## 2025-02-17 ENCOUNTER — Encounter

## 2025-02-22 ENCOUNTER — Encounter

## 2025-02-24 ENCOUNTER — Encounter

## 2025-03-01 ENCOUNTER — Encounter

## 2025-03-03 ENCOUNTER — Encounter

## 2025-03-08 ENCOUNTER — Encounter

## 2025-08-04 ENCOUNTER — Ambulatory Visit: Admitting: Urology

## 2025-11-18 ENCOUNTER — Ambulatory Visit: Admitting: Nurse Practitioner
